# Patient Record
Sex: Female | Born: 1970
Health system: Southern US, Community
[De-identification: ages and names within clinical notes are randomized; demographics above are authoritative.]

## PROBLEM LIST (undated history)

## (undated) DIAGNOSIS — Z8659 Personal history of other mental and behavioral disorders: Secondary | ICD-10-CM

## (undated) DIAGNOSIS — F329 Major depressive disorder, single episode, unspecified: Secondary | ICD-10-CM

## (undated) DIAGNOSIS — E785 Hyperlipidemia, unspecified: Secondary | ICD-10-CM

## (undated) DIAGNOSIS — K209 Esophagitis, unspecified without bleeding: Secondary | ICD-10-CM

## (undated) DIAGNOSIS — M1990 Unspecified osteoarthritis, unspecified site: Secondary | ICD-10-CM

## (undated) DIAGNOSIS — F32A Depression, unspecified: Secondary | ICD-10-CM

## (undated) DIAGNOSIS — K219 Gastro-esophageal reflux disease without esophagitis: Secondary | ICD-10-CM

## (undated) DIAGNOSIS — F410 Panic disorder [episodic paroxysmal anxiety] without agoraphobia: Secondary | ICD-10-CM

## (undated) DIAGNOSIS — Z973 Presence of spectacles and contact lenses: Secondary | ICD-10-CM

## (undated) DIAGNOSIS — R351 Nocturia: Secondary | ICD-10-CM

## (undated) DIAGNOSIS — T83711A Erosion of implanted vaginal mesh and other prosthetic materials to surrounding organ or tissue, initial encounter: Secondary | ICD-10-CM

## (undated) DIAGNOSIS — F319 Bipolar disorder, unspecified: Secondary | ICD-10-CM

## (undated) DIAGNOSIS — F1020 Alcohol dependence, uncomplicated: Secondary | ICD-10-CM

## (undated) DIAGNOSIS — K2970 Gastritis, unspecified, without bleeding: Secondary | ICD-10-CM

## (undated) DIAGNOSIS — F419 Anxiety disorder, unspecified: Secondary | ICD-10-CM

## (undated) DIAGNOSIS — R350 Frequency of micturition: Secondary | ICD-10-CM

## (undated) DIAGNOSIS — N393 Stress incontinence (female) (male): Secondary | ICD-10-CM

## (undated) DIAGNOSIS — B9681 Helicobacter pylori [H. pylori] as the cause of diseases classified elsewhere: Secondary | ICD-10-CM

## (undated) DIAGNOSIS — R748 Abnormal levels of other serum enzymes: Secondary | ICD-10-CM

## (undated) HISTORY — PX: ABDOMINAL HYSTERECTOMY: SHX81

## (undated) HISTORY — PX: OTHER SURGICAL HISTORY: SHX169

## (undated) HISTORY — PX: CARPAL TUNNEL RELEASE: SHX101

## (undated) HISTORY — DX: Alcohol dependence, uncomplicated: F10.20

## (undated) HISTORY — DX: Gastro-esophageal reflux disease without esophagitis: K21.9

## (undated) HISTORY — PX: TUBAL LIGATION: SHX77

---

## 1990-10-13 HISTORY — PX: CHOLECYSTECTOMY: SHX55

## 1998-05-07 ENCOUNTER — Encounter: Admission: RE | Admit: 1998-05-07 | Discharge: 1998-05-07 | Payer: Self-pay | Admitting: Family Medicine

## 1998-06-22 ENCOUNTER — Encounter: Admission: RE | Admit: 1998-06-22 | Discharge: 1998-06-22 | Payer: Self-pay | Admitting: Family Medicine

## 1998-09-20 ENCOUNTER — Encounter: Admission: RE | Admit: 1998-09-20 | Discharge: 1998-09-20 | Payer: Self-pay | Admitting: Family Medicine

## 1999-12-09 ENCOUNTER — Encounter: Admission: RE | Admit: 1999-12-09 | Discharge: 1999-12-09 | Payer: Self-pay | Admitting: Family Medicine

## 2000-01-17 ENCOUNTER — Encounter: Admission: RE | Admit: 2000-01-17 | Discharge: 2000-01-17 | Payer: Self-pay | Admitting: Family Medicine

## 2000-02-03 ENCOUNTER — Encounter: Admission: RE | Admit: 2000-02-03 | Discharge: 2000-02-03 | Payer: Self-pay | Admitting: Family Medicine

## 2000-02-03 ENCOUNTER — Other Ambulatory Visit: Admission: RE | Admit: 2000-02-03 | Discharge: 2000-02-03 | Payer: Self-pay | Admitting: Family Medicine

## 2001-05-11 ENCOUNTER — Encounter: Admission: RE | Admit: 2001-05-11 | Discharge: 2001-05-11 | Payer: Self-pay | Admitting: Family Medicine

## 2002-11-01 ENCOUNTER — Encounter: Admission: RE | Admit: 2002-11-01 | Discharge: 2002-11-01 | Payer: Self-pay | Admitting: Family Medicine

## 2003-08-25 ENCOUNTER — Encounter: Admission: RE | Admit: 2003-08-25 | Discharge: 2003-08-25 | Payer: Self-pay | Admitting: Family Medicine

## 2004-01-16 ENCOUNTER — Encounter: Admission: RE | Admit: 2004-01-16 | Discharge: 2004-01-16 | Payer: Self-pay | Admitting: Sports Medicine

## 2004-05-15 ENCOUNTER — Emergency Department (HOSPITAL_COMMUNITY): Admission: EM | Admit: 2004-05-15 | Discharge: 2004-05-15 | Payer: Self-pay | Admitting: Family Medicine

## 2004-05-24 ENCOUNTER — Encounter: Admission: RE | Admit: 2004-05-24 | Discharge: 2004-05-24 | Payer: Self-pay | Admitting: Family Medicine

## 2004-06-28 ENCOUNTER — Emergency Department (HOSPITAL_COMMUNITY): Admission: EM | Admit: 2004-06-28 | Discharge: 2004-06-28 | Payer: Self-pay | Admitting: Emergency Medicine

## 2004-07-11 ENCOUNTER — Ambulatory Visit: Payer: Self-pay | Admitting: Sports Medicine

## 2004-11-28 ENCOUNTER — Ambulatory Visit: Payer: Self-pay | Admitting: Sports Medicine

## 2004-12-17 ENCOUNTER — Ambulatory Visit: Payer: Self-pay | Admitting: Family Medicine

## 2004-12-25 ENCOUNTER — Ambulatory Visit: Payer: Self-pay | Admitting: Family Medicine

## 2004-12-25 ENCOUNTER — Other Ambulatory Visit: Admission: RE | Admit: 2004-12-25 | Discharge: 2004-12-25 | Payer: Self-pay | Admitting: Family Medicine

## 2005-01-17 ENCOUNTER — Ambulatory Visit: Payer: Self-pay | Admitting: Family Medicine

## 2005-02-05 ENCOUNTER — Ambulatory Visit: Payer: Self-pay | Admitting: Family Medicine

## 2005-06-30 ENCOUNTER — Ambulatory Visit: Payer: Self-pay | Admitting: Sports Medicine

## 2005-09-11 ENCOUNTER — Ambulatory Visit: Payer: Self-pay | Admitting: Sports Medicine

## 2005-09-11 ENCOUNTER — Encounter: Admission: RE | Admit: 2005-09-11 | Discharge: 2005-09-11 | Payer: Self-pay | Admitting: Family Medicine

## 2005-09-28 ENCOUNTER — Emergency Department (HOSPITAL_COMMUNITY): Admission: EM | Admit: 2005-09-28 | Discharge: 2005-09-28 | Payer: Self-pay | Admitting: Family Medicine

## 2005-10-23 HISTORY — PX: OTHER SURGICAL HISTORY: SHX169

## 2005-11-13 ENCOUNTER — Ambulatory Visit: Payer: Self-pay | Admitting: Family Medicine

## 2005-11-13 ENCOUNTER — Other Ambulatory Visit: Admission: RE | Admit: 2005-11-13 | Discharge: 2005-11-13 | Payer: Self-pay | Admitting: Family Medicine

## 2005-11-13 ENCOUNTER — Encounter (INDEPENDENT_AMBULATORY_CARE_PROVIDER_SITE_OTHER): Payer: Self-pay | Admitting: *Deleted

## 2005-11-13 LAB — CONVERTED CEMR LAB

## 2006-04-22 ENCOUNTER — Encounter (INDEPENDENT_AMBULATORY_CARE_PROVIDER_SITE_OTHER): Payer: Self-pay | Admitting: Specialist

## 2006-04-22 ENCOUNTER — Ambulatory Visit (HOSPITAL_COMMUNITY): Admission: RE | Admit: 2006-04-22 | Discharge: 2006-04-22 | Payer: Self-pay | Admitting: *Deleted

## 2006-04-22 HISTORY — PX: OTHER SURGICAL HISTORY: SHX169

## 2006-06-12 ENCOUNTER — Emergency Department (HOSPITAL_COMMUNITY): Admission: EM | Admit: 2006-06-12 | Discharge: 2006-06-12 | Payer: Self-pay | Admitting: Family Medicine

## 2006-08-17 ENCOUNTER — Ambulatory Visit: Payer: Self-pay | Admitting: Sports Medicine

## 2006-10-22 ENCOUNTER — Encounter (INDEPENDENT_AMBULATORY_CARE_PROVIDER_SITE_OTHER): Payer: Self-pay | Admitting: Specialist

## 2006-10-22 ENCOUNTER — Ambulatory Visit (HOSPITAL_COMMUNITY): Admission: RE | Admit: 2006-10-22 | Discharge: 2006-10-22 | Payer: Self-pay | Admitting: *Deleted

## 2006-10-22 HISTORY — PX: LAPAROSCOPIC ASSISTED VAGINAL HYSTERECTOMY: SHX5398

## 2006-11-18 ENCOUNTER — Emergency Department (HOSPITAL_COMMUNITY): Admission: EM | Admit: 2006-11-18 | Discharge: 2006-11-18 | Payer: Self-pay | Admitting: Family Medicine

## 2006-11-20 ENCOUNTER — Ambulatory Visit: Payer: Self-pay | Admitting: Family Medicine

## 2006-11-25 ENCOUNTER — Ambulatory Visit: Payer: Self-pay | Admitting: Family Medicine

## 2006-11-25 ENCOUNTER — Encounter (INDEPENDENT_AMBULATORY_CARE_PROVIDER_SITE_OTHER): Payer: Self-pay | Admitting: Family Medicine

## 2006-11-25 LAB — CONVERTED CEMR LAB
Cholesterol: 194 mg/dL (ref 0–200)
Free T4: 1.02 ng/dL (ref 0.89–1.80)
HDL: 58 mg/dL (ref >39)
LDL Cholesterol: 120 mg/dL — ABNORMAL HIGH (ref 0–99)
TSH: 1.193 microintl units/mL (ref 0.350–5.50)
Total CHOL/HDL Ratio: 3.3
Triglycerides: 80 mg/dL (ref <150)
VLDL: 16 mg/dL (ref 0–40)

## 2006-12-10 DIAGNOSIS — F339 Major depressive disorder, recurrent, unspecified: Secondary | ICD-10-CM | POA: Insufficient documentation

## 2006-12-10 DIAGNOSIS — F4310 Post-traumatic stress disorder, unspecified: Secondary | ICD-10-CM

## 2006-12-10 DIAGNOSIS — K219 Gastro-esophageal reflux disease without esophagitis: Secondary | ICD-10-CM

## 2006-12-10 DIAGNOSIS — F410 Panic disorder [episodic paroxysmal anxiety] without agoraphobia: Secondary | ICD-10-CM

## 2006-12-11 ENCOUNTER — Encounter (INDEPENDENT_AMBULATORY_CARE_PROVIDER_SITE_OTHER): Payer: Self-pay | Admitting: *Deleted

## 2007-01-17 ENCOUNTER — Emergency Department (HOSPITAL_COMMUNITY): Admission: EM | Admit: 2007-01-17 | Discharge: 2007-01-17 | Payer: Self-pay | Admitting: Emergency Medicine

## 2007-01-27 ENCOUNTER — Telehealth (INDEPENDENT_AMBULATORY_CARE_PROVIDER_SITE_OTHER): Payer: Self-pay | Admitting: Family Medicine

## 2007-03-26 ENCOUNTER — Encounter (INDEPENDENT_AMBULATORY_CARE_PROVIDER_SITE_OTHER): Payer: Self-pay | Admitting: Surgery

## 2007-03-26 ENCOUNTER — Ambulatory Visit (HOSPITAL_BASED_OUTPATIENT_CLINIC_OR_DEPARTMENT_OTHER): Admission: RE | Admit: 2007-03-26 | Discharge: 2007-03-26 | Payer: Self-pay | Admitting: Surgery

## 2007-03-26 HISTORY — PX: OTHER SURGICAL HISTORY: SHX169

## 2007-10-25 ENCOUNTER — Emergency Department (HOSPITAL_COMMUNITY): Admission: EM | Admit: 2007-10-25 | Discharge: 2007-10-25 | Payer: Self-pay | Admitting: Family Medicine

## 2007-12-03 ENCOUNTER — Ambulatory Visit: Payer: Self-pay | Admitting: Family Medicine

## 2007-12-03 LAB — CONVERTED CEMR LAB: Rapid Strep: NEGATIVE

## 2008-02-18 ENCOUNTER — Telehealth (INDEPENDENT_AMBULATORY_CARE_PROVIDER_SITE_OTHER): Payer: Self-pay | Admitting: *Deleted

## 2008-04-03 ENCOUNTER — Telehealth: Payer: Self-pay | Admitting: Family Medicine

## 2008-05-04 ENCOUNTER — Ambulatory Visit: Payer: Self-pay | Admitting: Sports Medicine

## 2008-07-31 ENCOUNTER — Telehealth (INDEPENDENT_AMBULATORY_CARE_PROVIDER_SITE_OTHER): Payer: Self-pay | Admitting: *Deleted

## 2008-08-03 ENCOUNTER — Telehealth: Payer: Self-pay | Admitting: Family Medicine

## 2008-08-03 ENCOUNTER — Ambulatory Visit: Payer: Self-pay | Admitting: Family Medicine

## 2008-08-07 ENCOUNTER — Telehealth: Payer: Self-pay | Admitting: *Deleted

## 2008-08-08 ENCOUNTER — Telehealth: Payer: Self-pay | Admitting: Family Medicine

## 2008-09-04 ENCOUNTER — Ambulatory Visit: Payer: Self-pay | Admitting: Family Medicine

## 2009-03-21 ENCOUNTER — Ambulatory Visit: Payer: Self-pay | Admitting: Family Medicine

## 2009-03-21 DIAGNOSIS — N3946 Mixed incontinence: Secondary | ICD-10-CM

## 2009-03-21 LAB — CONVERTED CEMR LAB
Bilirubin Urine: NEGATIVE
Glucose, Urine, Semiquant: NEGATIVE
Ketones, urine, test strip: NEGATIVE
Nitrite: NEGATIVE
Protein, U semiquant: NEGATIVE
Specific Gravity, Urine: 1.02
Urobilinogen, UA: 0.2
WBC Urine, dipstick: NEGATIVE
pH: 6

## 2009-04-04 ENCOUNTER — Telehealth: Payer: Self-pay | Admitting: Family Medicine

## 2009-04-19 ENCOUNTER — Encounter: Payer: Self-pay | Admitting: Family Medicine

## 2009-04-19 ENCOUNTER — Ambulatory Visit: Payer: Self-pay | Admitting: Family Medicine

## 2009-04-19 DIAGNOSIS — E785 Hyperlipidemia, unspecified: Secondary | ICD-10-CM | POA: Insufficient documentation

## 2009-04-19 LAB — CONVERTED CEMR LAB
ALT: 48 units/L — ABNORMAL HIGH (ref 0–35)
AST: 37 units/L (ref 0–37)
Albumin: 4.4 g/dL (ref 3.5–5.2)
Alkaline Phosphatase: 60 units/L (ref 39–117)
BUN: 13 mg/dL (ref 6–23)
CO2: 21 meq/L (ref 19–32)
Calcium: 9.4 mg/dL (ref 8.4–10.5)
Chloride: 106 meq/L (ref 96–112)
Cholesterol: 230 mg/dL — ABNORMAL HIGH (ref 0–200)
Creatinine, Ser: 0.83 mg/dL (ref 0.40–1.20)
Glucose, Bld: 102 mg/dL — ABNORMAL HIGH (ref 70–99)
HDL: 60 mg/dL (ref >39)
LDL Cholesterol: 149 mg/dL — ABNORMAL HIGH (ref 0–99)
Potassium: 4.5 meq/L (ref 3.5–5.3)
Sodium: 139 meq/L (ref 135–145)
TSH: 1.451 microintl units/mL (ref 0.350–4.500)
Total Bilirubin: 0.4 mg/dL (ref 0.3–1.2)
Total CHOL/HDL Ratio: 3.8
Total Protein: 6.9 g/dL (ref 6.0–8.3)
Triglycerides: 107 mg/dL (ref <150)
VLDL: 21 mg/dL (ref 0–40)

## 2009-04-20 ENCOUNTER — Telehealth: Payer: Self-pay | Admitting: *Deleted

## 2009-04-26 ENCOUNTER — Encounter: Payer: Self-pay | Admitting: Family Medicine

## 2009-05-03 ENCOUNTER — Telehealth (INDEPENDENT_AMBULATORY_CARE_PROVIDER_SITE_OTHER): Payer: Self-pay | Admitting: *Deleted

## 2009-06-01 ENCOUNTER — Encounter: Payer: Self-pay | Admitting: Family Medicine

## 2009-06-13 ENCOUNTER — Telehealth: Payer: Self-pay | Admitting: Family Medicine

## 2009-06-14 ENCOUNTER — Ambulatory Visit: Payer: Self-pay | Admitting: Family Medicine

## 2009-07-10 ENCOUNTER — Encounter: Payer: Self-pay | Admitting: Family Medicine

## 2009-08-09 ENCOUNTER — Ambulatory Visit (HOSPITAL_BASED_OUTPATIENT_CLINIC_OR_DEPARTMENT_OTHER): Admission: RE | Admit: 2009-08-09 | Discharge: 2009-08-10 | Payer: Self-pay | Admitting: Urology

## 2009-08-16 ENCOUNTER — Encounter: Payer: Self-pay | Admitting: Family Medicine

## 2009-09-11 ENCOUNTER — Encounter: Payer: Self-pay | Admitting: Family Medicine

## 2009-10-01 ENCOUNTER — Telehealth: Payer: Self-pay | Admitting: Family Medicine

## 2009-10-03 ENCOUNTER — Ambulatory Visit: Payer: Self-pay | Admitting: Family Medicine

## 2009-10-03 LAB — CONVERTED CEMR LAB: Rapid Strep: NEGATIVE

## 2009-12-20 ENCOUNTER — Telehealth: Payer: Self-pay | Admitting: Family Medicine

## 2010-01-11 ENCOUNTER — Encounter: Payer: Self-pay | Admitting: *Deleted

## 2010-07-19 ENCOUNTER — Encounter: Payer: Self-pay | Admitting: Family Medicine

## 2010-08-15 ENCOUNTER — Encounter: Payer: Self-pay | Admitting: Family Medicine

## 2010-10-09 ENCOUNTER — Encounter: Payer: Self-pay | Admitting: Gastroenterology

## 2010-11-12 NOTE — Progress Notes (Signed)
Summary: phn msg   Phone Note Call from Patient   Caller: Patient Summary of Call: had to resch today b/c her cat is very sick Initial call taken by: De Nurse,  December 20, 2009 9:52 AM

## 2010-11-12 NOTE — Letter (Signed)
Summary: Generic Letter  [facility]     [place], [place]    Phone:   Fax:     07/19/2010  Margaret Barker [address] [place], [place]  [address]  Dear [name],   I am writing to inform you that you need to make an appointment to come in for your yearly well-woman visit, which includes a PAP smear. It looks as though it has been a while since you have come in to have routine screening done. Please call the office and make an appointment with me, Dr. Fara Boros, your new primary doctor.  I look forward to meeting you!        Sincerely,   Demetria Pore MD  Appended Document: Generic Letter mailed.

## 2010-11-12 NOTE — Letter (Signed)
Summary: Probation Letter  [facility]  [address]   [place], [place] [address]   Phone: [phone]  Fax: [phone]    01/11/2010  Margaret Barker [address] [place], [place]  [address]  Dear [name],  With the goal of better serving all our patients the [facility] is following each patient's missed appointments.  You have missed at least 3 appointments with our practice.If you cannot keep your appointment, we expect you to call at least 24 hours before your appointment time.  Missing appointments prevents other patients from seeing [place] and makes it difficult to provide you with the best possible medical care.      1.   If you miss one more appointment, we will only give you limited medical services. This means we will not call in medication refills, complete a form, or make a referral for you except when you are here for a scheduled office visit.    2.   If you miss 2 or more appointments in the next year, we will dismiss you from our practice.    Our office staff can be reached at [phone] Monday through Friday from 8:30 a.m.-5:00 p.m. and will be glad to schedule your appointment as necessary.    Thank you.   [facility]

## 2010-11-12 NOTE — Miscellaneous (Signed)
   Clinical Lists Changes  Problems: Removed problem of THROAT PAIN (ICD-784.1) Removed problem of COUGH, CHRONIC (ICD-786.2) Removed problem of WEIGHT GAIN (ICD-783.1) Removed problem of HEMATURIA, MICROSCOPIC, HX OF (ICD-V13.09) Removed problem of ALLERGIC RHINITIS CAUSE UNSPECIFIED (ICD-477.9) Removed problem of BREAST MASS, LEFT (ICD-611.72) Removed problem of HYPERHIDROSIS (ICD-780.8) Medications: Removed medication of AMBIEN 5 MG TABS (ZOLPIDEM TARTRATE) 1 tab by mouth daily. Removed medication of CHLORASEPTIC 1.4 % LIQD (PHENOL) 2 sprays into throat every 4-6 hrs as needed throat pain.

## 2010-11-14 DIAGNOSIS — R1013 Epigastric pain: Secondary | ICD-10-CM

## 2010-11-14 DIAGNOSIS — R197 Diarrhea, unspecified: Secondary | ICD-10-CM

## 2010-11-14 NOTE — Letter (Signed)
Summary: New Patient letter  [facility]  [address] [place], [place] [address]   Phone: [phone]  Fax: [phone]       10/09/2010 MRN: [id number]  Margaret Barker [address] [place], [place]  [address]  Dear Margaret Barker,  Welcome to the Gastroenterology Division at [facility]    You are scheduled to see Dr.  Arlyce Dice on 11-18-10 at 2:15pm on the 3rd floor at [facility], [address]  We ask that you try to arrive at our office 15 minutes prior to your appointment time to allow for check-in.  We would like you to complete the enclosed self-administered evaluation form prior to your visit and bring it with you on the day of your appointment.  We will review it with you.  Also, please bring a complete list of all your medications or, if you prefer, bring the medication bottles and we will list them.  Please bring your insurance card so that we may make a copy of it.  If your insurance requires a referral to see a specialist, please bring your referral form from your primary care physician.  Co-payments are due at the time of your visit and may be paid by cash, check or credit card.     Your office visit will consist of a consult with your physician (includes a physical exam), any laboratory testing he/she may order, scheduling of any necessary diagnostic testing (e.g. x-ray, ultrasound, CT-scan), and scheduling of a procedure (e.g. Endoscopy, Colonoscopy) if required.  Please allow enough time on your schedule to allow for any/all of these possibilities.    If you cannot keep your appointment, please call [phone] to cancel or reschedule prior to your appointment date.  This allows [place] the opportunity to schedule an appointment for another patient in need of care.  If you do not cancel or reschedule by 5 p.m. the business day prior to your appointment date, you will be charged a $50.00 late cancellation/no-show fee.    Thank you for choosing [facility] for your medical needs.  We appreciate the opportunity to care for you.  Please visit [place] at our website  to learn more about our practice.                     Sincerely,                                                             The Gastroenterology Division

## 2010-11-18 ENCOUNTER — Encounter: Payer: Self-pay | Admitting: Gastroenterology

## 2010-11-18 ENCOUNTER — Other Ambulatory Visit: Payer: Self-pay | Admitting: Gastroenterology

## 2010-11-18 ENCOUNTER — Encounter: Payer: Self-pay | Admitting: Internal Medicine

## 2010-11-18 ENCOUNTER — Ambulatory Visit (INDEPENDENT_AMBULATORY_CARE_PROVIDER_SITE_OTHER): Payer: Medicare Other | Admitting: Gastroenterology

## 2010-11-18 ENCOUNTER — Other Ambulatory Visit: Payer: Medicare Other

## 2010-11-18 DIAGNOSIS — R197 Diarrhea, unspecified: Secondary | ICD-10-CM | POA: Insufficient documentation

## 2010-11-18 DIAGNOSIS — R1084 Generalized abdominal pain: Secondary | ICD-10-CM | POA: Insufficient documentation

## 2010-11-18 DIAGNOSIS — R1013 Epigastric pain: Secondary | ICD-10-CM

## 2010-11-18 LAB — CBC WITH DIFFERENTIAL/PLATELET
Basophils Absolute: 0 10*3/uL (ref 0.0–0.1)
Eosinophils Absolute: 0.1 10*3/uL (ref 0.0–0.7)
Hemoglobin: 14.2 g/dL (ref 12.0–15.0)
Lymphocytes Relative: 28.7 % (ref 12.0–46.0)
Lymphs Abs: 2.8 10*3/uL (ref 0.7–4.0)
MCHC: 34.3 g/dL (ref 30.0–36.0)
MCV: 91.2 fl (ref 78.0–100.0)
Monocytes Absolute: 0.5 10*3/uL (ref 0.1–1.0)
Monocytes Relative: 4.8 % (ref 3.0–12.0)
Neutro Abs: 6.3 10*3/uL (ref 1.4–7.7)
Neutrophils Relative %: 65.4 % (ref 43.0–77.0)
RBC: 4.55 Mil/uL (ref 3.87–5.11)
RDW: 13.4 % (ref 11.5–14.6)
WBC: 9.6 10*3/uL (ref 4.5–10.5)

## 2010-11-18 LAB — HEPATIC FUNCTION PANEL
ALT: 15 U/L (ref 0–35)
AST: 15 U/L (ref 0–37)
Albumin: 4.5 g/dL (ref 3.5–5.2)
Alkaline Phosphatase: 40 U/L (ref 39–117)
Bilirubin, Direct: 0.1 mg/dL (ref 0.0–0.3)
Total Bilirubin: 0.5 mg/dL (ref 0.3–1.2)

## 2010-11-21 ENCOUNTER — Encounter (INDEPENDENT_AMBULATORY_CARE_PROVIDER_SITE_OTHER): Payer: Self-pay | Admitting: *Deleted

## 2010-11-21 ENCOUNTER — Other Ambulatory Visit: Payer: Medicare Other

## 2010-11-21 ENCOUNTER — Other Ambulatory Visit: Payer: Medicare Other | Admitting: Gastroenterology

## 2010-11-21 ENCOUNTER — Other Ambulatory Visit: Payer: Self-pay | Admitting: Gastroenterology

## 2010-11-21 DIAGNOSIS — R109 Unspecified abdominal pain: Secondary | ICD-10-CM

## 2010-11-21 LAB — FECAL OCCULT BLOOD, IMMUNOCHEMICAL: Fecal Occult Bld: NEGATIVE

## 2010-11-22 ENCOUNTER — Other Ambulatory Visit: Payer: Self-pay | Admitting: Gastroenterology

## 2010-11-22 ENCOUNTER — Encounter: Payer: Self-pay | Admitting: Gastroenterology

## 2010-11-26 ENCOUNTER — Other Ambulatory Visit: Payer: Self-pay | Admitting: Gastroenterology

## 2010-11-26 ENCOUNTER — Encounter (AMBULATORY_SURGERY_CENTER): Payer: Medicare Other | Admitting: Gastroenterology

## 2010-11-26 ENCOUNTER — Encounter: Payer: Self-pay | Admitting: Gastroenterology

## 2010-11-26 DIAGNOSIS — R109 Unspecified abdominal pain: Secondary | ICD-10-CM

## 2010-11-26 DIAGNOSIS — R197 Diarrhea, unspecified: Secondary | ICD-10-CM

## 2010-11-27 ENCOUNTER — Telehealth: Payer: Self-pay | Admitting: Gastroenterology

## 2010-11-27 ENCOUNTER — Other Ambulatory Visit (HOSPITAL_COMMUNITY): Payer: Medicare Other

## 2010-11-28 NOTE — Assessment & Plan Note (Signed)
Summary: CHRONIC DIARRHEA/YF   NO GI HX PER DEBBIE [phone]//MCR & MED...    History of Present Illness Primary GI MD: Melvia Heaps MD [facility] Primary Provider: Helene Kelp, PA Requesting Provider: Helene Kelp, MD Chief Complaint: Intermittant sharp abd pains with urgent diarrhea since September. Pt denies any blood in stool. Pt does have Nausea and loss of appetitie with episodes. History of Present Illness:   Margaret Barker is a 40 year old white female referred at the request of Dr. Caryn Bee for evaluation of diarrhea and abdominal pain.  For the past 6 months she has been company of diarrhea consisting of 1-2 loose stools a day, up to 3-4 times a week.  Diarrhea is accompaned by crampy lower bowel pain and urgency.  On at least one occasion she claims that stools were black.  She does not awaken to move her bowels.  She also complains of frequent upper abdominal pain.  Pain may last until she drinks viscous lidocaine.  She takes omeprazole daily.  It is without radiation and unrelated to eating.  There has been no change in her medications nor has she taken antibiotics.  She is on no gastric irritants including nonsteroidals.   GI Review of Systems    Reports abdominal pain, acid reflux, loss of appetite, nausea, and  weight loss.     Location of  Abdominal pain: upper abdomen. Weight loss of 30 pounds over 5 months.   Denies belching, bloating, chest pain, dysphagia with liquids, dysphagia with solids, heartburn, vomiting, vomiting blood, and  weight gain.      Reports diarrhea.     Denies anal fissure, black tarry stools, change in bowel habit, constipation, diverticulosis, fecal incontinence, heme positive stool, hemorrhoids, irritable bowel syndrome, jaundice, light color stool, liver problems, rectal bleeding, and  rectal pain.    Current Medications (verified): 1)  Bupropion Hcl 300 Mg Xr24h-Tab (Bupropion Hcl) .... One Tablet By Mouth Once Daily 2)  Lexapro 20 Mg Tabs (Escitalopram Oxalate)  .Marland Kitchen.. 1 Tab By Mouth Daily 3)  Alprazolam 1 Mg Tabs (Alprazolam) .... One Tablet By Mouth Four Times A Day 4)  Omeprazole 40 Mg Cpdr (Omeprazole) .... One Tablet By Mouth Once Daily 5)  Clonidine Hcl 0.1 Mg Tabs (Clonidine Hcl) .... One Tablet By Mouth At Bedtime 6)  Lexapro 20 Mg Tabs (Escitalopram Oxalate) .... One Tablet By Mouth Once Daily 7)  Lidocaine Hcl 2 % Soln (Lidocaine Hcl) .Marland Kitchen.. 1 Tsp 4 X Daily As Needed For Abdominal Pain  Allergies (verified): No Known Drug Allergies  Past History:  Past Medical History: disabled secondary to psych issues since 2001, dysmenorrhea-->hysterctomy 01/08, hyplori positive treated 2/06, personality disorder, R breast cyst -->sx on 06/07, Takes boric acid vaginal supp. qd for recurrent BV Alcoholism Anxiety Disorder GERD  Past Surgical History: Reviewed history from 12/10/2006 and no changes required. BTL - 01/18/2000, Cholecystectomy - 01/18/2000, Hysterectomy - Partial - 10/13/2006, Lipid Panel 11/25/06 TC=194, TG=80, HDL=58, LDL=120 - 11/26/2006, R breast cyst removed - 03/13/2006  Family History: HTN,DM, Cervical Cancer Family History of Diabetes: Mother  Social History: Reviewed history from 12/10/2006 and no changes required. Gravida 5 Para 3-1-1-3; Divorced with 3 children.  Children live with their grandmother. Remarried x5 yr , lives with her husband. Smokes 1/2 ppd.  Quit ETOH in 2000.; Disabled secondary to psyc. Issues since 2001  Review of Systems       The patient complains of allergy/sinus, anxiety-new, back pain, headaches-new, muscle pains/cramps, sore throat, thirst - excessive, and urination -  excessive.  The patient denies anemia, arthritis/joint pain, blood in urine, breast changes/lumps, change in vision, confusion, cough, coughing up blood, depression-new, fainting, fatigue, fever, hearing problems, heart murmur, heart rhythm changes, itching, menstrual pain, night sweats, nosebleeds, pregnancy symptoms, shortness of breath, skin  rash, sleeping problems, swelling of feet/legs, swollen lymph glands, thirst - excessive , urination - excessive , urination changes/pain, urine leakage, vision changes, and voice change.         All other systems were reviewed and were negative   Vital Signs:  Patient profile:   40 year old female Height:      64.5 inches Weight:      128.25 pounds BMI:     21.75 Pulse rate:   88 / minute Pulse rhythm:   regular BP sitting:   92 / 48  (right arm) Cuff size:   regular  Vitals Entered By: Christie Nottingham CMA Duncan Dull) (November 18, 2010 2:02 PM)  Physical Exam  Additional Exam:  On physical exam she is a well-developed well-nourished female  skin: anicteric HEENT: normocephalic; PEERLA; no nasal or pharyngeal abnormalities neck: supple nodes: no cervical lymphadenopathy chest: clear to ausculatation and percussion heart: no murmurs, gallops, or rubs abd: soft, nontender; BS normoactive; no abdominal masses,  organomegaly; there is minimal tenderness to palpation in the upper midepigastrium rectal: deferred ext: no cynanosis, clubbing, edema skeletal: no deformities neuro: oriented x 3; no focal abnormalities    Impression & Recommendations:  Problem # 1:  DIARRHEA (ICD-787.91) Symptoms are nonspecific.  History of a black stool raises the question of GI bleeding.  With the predominance of upper GI symptoms including pain one must consider diarrhea secondary to a more proximal GI problem.  Infection is unlikely.  Although there has been no change in her medications she is on several medications that can cause diarrhea.  Recommendations #1 complete workup for her upper abdominal pain.  If no abnormalities are seen I would consider colonoscopy #2 Hemoccult #3 consider fiber supplementation and anticholinergics  Problem # 2:  ABDOMINAL PAIN, GENERALIZED (ICD-789.07) Etiology of her upper abdominal  pain is unclear.  This could be due to ulcer or nonulcer dyspepsia.  Chronic  cholecystitis is also another consideration.  Recommendations #1 upper endoscopy #2 abdominal ultrasound if endoscopy is unrevealing  #3 check LFTs Orders: TLB-CBC Platelet - w/Differential (85025-CBCD) TLB-Hepatic/Liver Function Pnl (80076-HEPATIC) EGD (EGD)  Patient Instructions: 1)  Copy sent to : Helene Kelp, PA 2)  Your EGD is scheduled on 11/21/2010 at 9am 3)  You will go to the basement for labs today 4)  Conscious Sedation brochure given.  5)  Upper Endoscopy brochure given.  6)  The medication list was reviewed and reconciled.  All changed / newly prescribed medications were explained.  A complete medication list was provided to the patient / caregiver.

## 2010-11-28 NOTE — Letter (Signed)
Summary: Results Letter  [facility]  [address] [place], [place] [address]   Phone: [phone]  Fax: [phone]        November 18, 2010 MRN: [id number]    Margaret Barker [address] [place], [place]  [address]    Dear [name],  It is my pleasure to have treated you recently as a new patient in my office. I appreciate your confidence and the opportunity to participate in your care.  Since I do have a busy inpatient endoscopy schedule and office schedule, my office hours vary weekly. I am, however, available for emergency calls everyday through my office. If I am not available for an urgent office appointment, another one of our gastroenterologist will be able to assist you.  My well-trained staff are prepared to help you at all times. For emergencies after office hours, a physician from our Gastroenterology section is always available through my 24 hour answering service  Once again I welcome you as a new patient and I look forward to a happy and healthy relationship             Sincerely,  Louis Meckel MD  This letter has been electronically signed by your physician.  Appended Document: Results Letter letter mailed

## 2010-11-28 NOTE — Procedures (Addendum)
Summary: Upper Endoscopy  Patient: Margaret Barker Note: All result statuses are Final unless otherwise noted.  Tests: (1) Upper Endoscopy (EGD)   EGD Upper Endoscopy       DONE     [facility]     [address]     [place], [place]  [address]           ENDOSCOPY PROCEDURE REPORT           PATIENT:  [name], [name]  MR#:  [id number]     BIRTHDATE:  04/07/1971, 39 yrs. old  GENDER:  female           ENDOSCOPIST:  Barbette Hair. Arlyce Dice, MD     Referred by:  Helene Kelp, PA           PROCEDURE DATE:  11/21/2010     PROCEDURE:  EGD, diagnostic 43235     ASA CLASS:  Class II     INDICATIONS:  abdominal pain           MEDICATIONS:   Fentanyl 100 mcg IV, Versed 10 mg IV, Benadryl 50     mg IV, glycopyrrolate (Robinal) 0.2 mg IV, 0.6cc simethancone 0.6     cc PO     TOPICAL ANESTHETIC:  Exactacain Spray           DESCRIPTION OF PROCEDURE:   After the risks benefits and     alternatives of the procedure were thoroughly explained, informed     consent was obtained.  The LB [id number] [id number] endoscope was     introduced through the mouth and advanced to the third portion of     the duodenum, without limitations.  The instrument was slowly     withdrawn as the mucosa was fully examined.     <<PROCEDUREIMAGES>>           The upper, middle, and distal third of the esophagus were     carefully inspected and no abnormalities were noted. The z-line     was well seen at the GEJ. The endoscope was pushed into the fundus     which was normal including a retroflexed view. The antrum,gastric     body, first and second part of the duodenum were unremarkable (see     image1, image2, image3, image4, image5, image6, and image7).     Retroflexed views revealed no abnormalities.    The scope was then     withdrawn from the patient and the procedure completed.     COMPLICATIONS:  None           ENDOSCOPIC IMPRESSION:     1) Normal EGD     RECOMMENDATIONS:     1) My office will arrange for you to  have an abdominal     ultrasound performed.     2) My office will schedule a colonoscopy     3) Sedation with MAC for future procedures           REPEAT EXAM:  No           ______________________________     Barbette Hair. Arlyce Dice, MD           CC:           n.     eSIGNED:   Barbette Hair. Ninnie Fein at 11/21/2010 09:28 AM           [name], [id number]  Note: An exclamation mark (!) indicates a result that was not dispersed into the  flowsheet. Document Creation Date: 11/21/2010 9:29 AM _______________________________________________________________________  (1) Order result status: Final Collection or observation date-time: 11/21/2010 09:24 Requested date-time:  Receipt date-time:  Reported date-time:  Referring Physician:   Ordering Physician: Melvia Heaps [phone]) Specimen Source:  Source: Launa Grill Order Number: [id number] Lab site:   Appended Document: Upper Endoscopy Abdominal ultrasound scheduled at [facility] for 11/27/10@11am . Left message for patient to call me back.  Appended Document: Upper Endoscopy Spoke with patient and she is aware of abdominal ultrasound appointment date and time.

## 2010-11-28 NOTE — Letter (Signed)
Summary: EGD Instructions  [facility]  [address] [place], [place] [address]   Phone: [phone]  Fax: [phone]       Margaret Barker    1971-01-04    MRN: [id number]       Procedure Day /Date:THURSDAY 11/21/2010     Arrival Time: 8AM     Procedure Time:9AM     Location of Procedure:                    X  [facility] (4th Floor)   PREPARATION FOR ENDOSCOPY   On 11/21/2010  THE DAY OF THE PROCEDURE:  1.   No solid foods, milk or milk products are allowed after midnight the night before your procedure.  2.   Do not drink anything colored red or purple.  Avoid juices with pulp.  No orange juice.  3.  You may drink clear liquids until7AM, which is 2 hours before your procedure.                                                                                                CLEAR LIQUIDS INCLUDE: Water Jello Ice Popsicles Tea (sugar ok, no milk/cream) Powdered fruit flavored drinks Coffee (sugar ok, no milk/cream) Gatorade Juice: apple, white grape, white cranberry  Lemonade Clear bullion, consomm, broth Carbonated beverages (any kind) Strained chicken noodle soup Hard Candy   MEDICATION INSTRUCTIONS  Unless otherwise instructed, you should take regular prescription medications with a small sip of water as early as possible the morning of your procedure.           OTHER INSTRUCTIONS  You will need a responsible adult at least 40 years of age to accompany you and drive you home.   This person must remain in the waiting room during your procedure.  Wear loose fitting clothing that is easily removed.  Leave jewelry and other valuables at home.  However, you may wish to bring a book to read or an iPod/MP3 player to listen to music as you wait for your procedure to start.  Remove all body piercing jewelry and leave at home.  Total time from sign-in until discharge is approximately 2-3 hours.  You should go home directly after your  procedure and rest.  You can resume normal activities the day after your procedure.  The day of your procedure you should not:   Drive   Make legal decisions   Operate machinery   Drink alcohol   Return to work  You will receive specific instructions about eating, activities and medications before you leave.    The above instructions have been reviewed and explained to me by   _______________________    I fully understand and can verbalize these instructions _____________________________ Date _________

## 2010-11-28 NOTE — Letter (Signed)
Summary: [facility]: Immunoassay Fecal Occult Blood (iFOB) Order Form  [facility]  [address] [place], [place] [address]   Phone: [phone]  Fax: [phone]      [facility]: Immunoassay Fecal Occult Blood (iFOB) Order Form   November 18, 2010 MRN: [id number]   Margaret Barker [birth year]   Physicican Name:ROBERT KAPLAN,MD Diagnosis Code:_787.91,789.06    Merri Ray CMA (AAMA)

## 2010-11-28 NOTE — Miscellaneous (Signed)
Summary: [facility]  Clinical Lists Changes  Medications: Added new medication of MOVIPREP 100 GM  SOLR (PEG-KCL-NACL-NASULF-NA ASC-C) As per prep instructions. - Signed Rx of MOVIPREP 100 GM  SOLR (PEG-KCL-NACL-NASULF-NA ASC-C) As per prep instructions.;  #1 x 0;  Signed;  Entered by: Ezra Sites RN;  Authorized by: Louis Meckel MD;  Method used: Electronically to [facility] [phone]*, [address], [place], [place], [place]  [address], Ph: [phone] or [phone], Fax: [phone] Observations: Added new observation of NKA: T (11/22/2010 12:17)    Prescriptions: MOVIPREP 100 GM  SOLR (PEG-KCL-NACL-NASULF-NA ASC-C) As per prep instructions.  #1 x 0   Entered by:   Ezra Sites RN   Authorized by:   Louis Meckel MD   Signed by:   Ezra Sites RN on 11/22/2010   Method used:   Electronically to        [facility] [phone]* (retail)       [address] [place], [place]  [address]       Ph: [phone] or [phone]       Fax: [phone]   RxID:   [id number]

## 2010-11-28 NOTE — Letter (Signed)
Summary: [facility] Instructions  [facility]  [address] [place], [place] [address]   Phone: [phone]  Fax: [phone]       Margaret Barker    1971/09/04    MRN: [id number]        Procedure Day /Date:  Tuesday 11/26/2010     Arrival Time: 9:00 am     Procedure Time: 10:00 am     Location of Procedure:                    _x _  [facility] (4th Floor)                        PREPARATION FOR COLONOSCOPY WITH MOVIPREP   Starting 5 days prior to your procedure Thursday 2/9 do not eat nuts, seeds, popcorn, corn, beans, peas,  salads, or any raw vegetables.  Do not take any fiber supplements (e.g. Metamucil, Citrucel, and Benefiber).  THE DAY BEFORE YOUR PROCEDURE         DATE: Monday 2/13  1.  Drink clear liquids the entire day-NO SOLID FOOD  2.  Do not drink anything colored red or purple.  Avoid juices with pulp.  No orange juice.  3.  Drink at least 64 oz. (8 glasses) of fluid/clear liquids during the day to prevent dehydration and help the prep work efficiently.  CLEAR LIQUIDS INCLUDE: Water Jello Ice Popsicles Tea (sugar ok, no milk/cream) Powdered fruit flavored drinks Coffee (sugar ok, no milk/cream) Gatorade Juice: apple, white grape, white cranberry  Lemonade Clear bullion, consomm, broth Carbonated beverages (any kind) Strained chicken noodle soup Hard Candy                             4.  In the morning, mix first dose of MoviPrep solution:    Empty 1 Pouch A and 1 Pouch B into the disposable container    Add lukewarm drinking water to the top line of the container. Mix to dissolve    Refrigerate (mixed solution should be used within 24 hrs)  5.  Begin drinking the prep at 5:00 p.m. The MoviPrep container is divided by 4 marks.   Every 15 minutes drink the solution down to the next mark (approximately 8 oz) until the full liter is complete.   6.  Follow completed prep with 16 oz of clear liquid of your choice (Nothing red  or purple).  Continue to drink clear liquids until bedtime.  7.  Before going to bed, mix second dose of MoviPrep solution:    Empty 1 Pouch A and 1 Pouch B into the disposable container    Add lukewarm drinking water to the top line of the container. Mix to dissolve    Refrigerate  THE DAY OF YOUR PROCEDURE      DATE: Tuesday 2/14  Beginning at 5:00 a.m. (5 hours before procedure):         1. Every 15 minutes, drink the solution down to the next mark (approx 8 oz) until the full liter is complete.  2. Follow completed prep with 16 oz. of clear liquid of your choice.    3. You may drink clear liquids until 8:00 am (2 HOURS BEFORE PROCEDURE).   MEDICATION INSTRUCTIONS  Unless otherwise instructed, you should take regular prescription medications with a small sip of water   as early as possible the morning of your  procedure.         OTHER INSTRUCTIONS  You will need a responsible adult at least 40 years of age to accompany you and drive you home.   This person must remain in the waiting room during your procedure.  Wear loose fitting clothing that is easily removed.  Leave jewelry and other valuables at home.  However, you may wish to bring a book to read or  an iPod/MP3 player to listen to music as you wait for your procedure to start.  Remove all body piercing jewelry and leave at home.  Total time from sign-in until discharge is approximately 2-3 hours.  You should go home directly after your procedure and rest.  You can resume normal activities the  day after your procedure.  The day of your procedure you should not:   Drive   Make legal decisions   Operate machinery   Drink alcohol   Return to work  You will receive specific instructions about eating, activities and medications before you leave.    The above instructions have been reviewed and explained to me by   Ezra Sites RN  November 22, 2010 1:07 PM    I fully understand and can verbalize  these instructions _____________________________ Date _________

## 2010-11-29 ENCOUNTER — Other Ambulatory Visit (HOSPITAL_COMMUNITY): Payer: Medicare Other

## 2010-11-29 ENCOUNTER — Telehealth: Payer: Self-pay | Admitting: Gastroenterology

## 2010-12-02 ENCOUNTER — Other Ambulatory Visit (HOSPITAL_COMMUNITY): Payer: Medicare Other

## 2010-12-03 ENCOUNTER — Encounter: Payer: Self-pay | Admitting: Gastroenterology

## 2010-12-04 ENCOUNTER — Other Ambulatory Visit: Payer: Self-pay | Admitting: Gastroenterology

## 2010-12-04 ENCOUNTER — Ambulatory Visit (HOSPITAL_COMMUNITY)
Admission: RE | Admit: 2010-12-04 | Discharge: 2010-12-04 | Disposition: A | Discharge status: Home / Self Care | Payer: Medicare Other | Source: Ambulatory Visit | Attending: Gastroenterology | Admitting: Gastroenterology

## 2010-12-04 DIAGNOSIS — Z9089 Acquired absence of other organs: Secondary | ICD-10-CM | POA: Insufficient documentation

## 2010-12-04 DIAGNOSIS — R109 Unspecified abdominal pain: Secondary | ICD-10-CM | POA: Insufficient documentation

## 2010-12-04 NOTE — Procedures (Addendum)
Summary: Colonoscopy  Patient: [name] Note: All result statuses are Final unless otherwise noted.  Tests: (1) Colonoscopy (COL)   COL Colonoscopy           DONE     [facility]     [address]     [place], [place]  [address]           COLONOSCOPY PROCEDURE REPORT           PATIENT:  Margaret Barker, Margaret Barker  MR#:  [id number]     BIRTHDATE:  [birth year], 39 yrs. old  GENDER:  female           ENDOSCOPIST:  Barbette Hair. Arlyce Dice, MD     Referred by:  Helene Kelp, PA           PROCEDURE DATE:  11/26/2010     PROCEDURE:  Colonoscopy with biopsy     ASA CLASS:  Class I     INDICATIONS:  1) unexplained diarrhea           MEDICATIONS:   MAC sedation, administered by CRNA propofol 200mg IV           DESCRIPTION OF PROCEDURE:   After the risks benefits and     alternatives of the procedure were thoroughly explained, informed     consent was obtained.  Digital rectal exam was performed and     revealed no abnormalities.   The LB 180AL [id number] endoscope was     introduced through the anus and advanced to the cecum, which was     identified by both the appendix and ileocecal valve, without     limitations.  The quality of the prep was excellent, using     MoviPrep.  The instrument was then slowly withdrawn as the colon     was fully examined.     <<PROCEDUREIMAGES>>           FINDINGS:  A normal appearing cecum, ileocecal valve, and     appendiceal orifice were identified. The ascending, hepatic     flexure, transverse, splenic flexure, descending, sigmoid colon,     and rectum appeared unremarkable (see image1, image3, image5,     image6, image7, image9, and image10). Random biopsies were taken     every 10cm   Retroflexed views in the rectum revealed no     abnormalities.    The time to cecum =  2.50  minutes. The scope     was then withdrawn (time =  6.75  min) from the patient and the     procedure completed.           COMPLICATIONS:  None           ENDOSCOPIC IMPRESSION:  1) Normal colon     RECOMMENDATIONS:     1) Await biopsy results     2) call office next 1-3 days to schedule followup visit in 2-3     weeks           REPEAT EXAM:  No           ______________________________     Barbette Hair. Arlyce Dice, MD           CC:           n.     eSIGNED:   Barbette Hair. Diarra Kos at 11/26/2010 10:10 AM           [name], [id number]  Note: An exclamation mark (!) indicates a result that was  not dispersed into the flowsheet. Document Creation Date: 11/26/2010 10:11 AM _______________________________________________________________________  (1) Order result status: Final Collection or observation date-time: 11/26/2010 10:04 Requested date-time:  Receipt date-time:  Reported date-time:  Referring Physician:   Ordering Physician: Melvia Heaps [phone]) Specimen Source:  Source: Launa Grill Order Number: [id number] Lab site:

## 2010-12-04 NOTE — Letter (Signed)
Summary: Appt Reminder 2   Gastroenterology  [address] [place], [place] [address]   Phone: [phone]  Fax: [phone]        November 26, 2010 MRN: [id number]    ZANI KYLLONEN [address] [place], [place]  [address]    Dear Ms. Grondahl,   You have a return appointment with Dr. Arlyce Dice on 12/13/10 at 3:15pm.  Please remember to bring a complete list of the medicines you are taking, your insurance card and your co-pay.  If you have to cancel or reschedule this appointment, please call before 5:00 pm the evening before to avoid a cancellation fee.  If you have any questions or concerns, please call [phone].    Sincerely,    Selinda Michaels RN  Appended Document: Appt Reminder 2 Letter is mailed to the patient's home address

## 2010-12-04 NOTE — Progress Notes (Signed)
Summary: need orders for abd ultrasound/pt is there now   Phone Note From Other Clinic   Caller: xrays fx # [phone] Call For: Dr Arlyce Dice Summary of Call: At [facility] for abd ultrasound and they have no orders. Initial call taken by: Leanor Kail [facility],  November 27, 2010 8:07 AM     Appended Document: need orders for abd ultrasound/pt is there now order faxed

## 2010-12-04 NOTE — Progress Notes (Signed)
Summary: Triage   Phone Note Call from Patient Call back at Home Phone [phone]   Caller: Patient Call For: Dr. Arlyce Dice Reason for Call: Talk to Nurse Summary of Call: Had a COL on Tueday and hasn't had a BM since then Initial call taken by: Karna Christmas,  November 29, 2010 12:10 PM  Follow-up for Phone Call        Spoke with patient who denies any pain except what she was having before. Instructed patient to try a gentle laxative like Miralax for her constipation. Explained her normal bowel habits will return- the prep just cleaned her out. I asked why patient did not go to her U/S and she stated she was too groggy and had to r/s to 12/04/10. Patient will call for further problems. Follow-up by: Graciella Freer RN,  November 29, 2010 1:38 PM

## 2010-12-10 NOTE — Letter (Signed)
Summary: Results Letter  [facility]  [address] [place], [place] [address]   Phone: [phone]  Fax: [phone]        December 03, 2010 MRN: [id number]    Margaret Barker [address] [place], [place]  [address]    Dear [name],  Your biopsy results did not show any remarkable findings.  Please continue with the recommendations previously discussed.  Should you have any further questions or immediate concers, feel free to contact me.  Sincerely,  Barbette Hair. Arlyce Dice, M.D., [facility]          Sincerely,  Louis Meckel MD  This letter has been electronically signed by your physician.  Appended Document: Results Letter letter mailed

## 2010-12-13 ENCOUNTER — Ambulatory Visit: Payer: Medicare Other | Admitting: Gastroenterology

## 2011-01-16 LAB — POCT HEMOGLOBIN-HEMACUE: Hemoglobin: 15.1 g/dL — ABNORMAL HIGH (ref 12.0–15.0)

## 2011-01-20 ENCOUNTER — Ambulatory Visit: Payer: Medicare Other | Admitting: Gastroenterology

## 2011-02-25 NOTE — Op Note (Signed)
NAMELUDMILA, EBARB                 ACCOUNT NO.:  [id number]   MEDICAL RECORD NO.:  [id number]          PATIENT TYPE:  AMB   LOCATION:  DSC                          FACILITY:  [facility]   PHYSICIAN:  Wilmon Arms. Corliss Skains, M.D. DATE OF BIRTH:  1971/09/03   DATE OF PROCEDURE:  03/26/2007  DATE OF DISCHARGE:                               OPERATIVE REPORT   PREOPERATIVE DIAGNOSIS:  Left breast mass.   POSTOPERATIVE DIAGNOSIS:  Left breast mass.   PROCEDURE PERFORMED:  Left breast excisional biopsy.   SURGEON:  Wilmon Arms. Tsuei, M.D.   ANESTHESIA:  General.   INDICATIONS:  The patient is a 40 year old female who has had a previous  benign breast biopsy on the right, who presents with a left breast mass  just above her nipple.  This has become tender.  Mammogram and  ultrasound showed no significant findings.  However, due to her concern  we are performing an excisional biopsy.   DESCRIPTION OF PROCEDURE:  The patient was brought to the operating room  and placed in the supine position on operating room table.  After an  adequate level of general anesthesia was obtained, the patient's left  breast was prepped with Betadine and draped in a sterile fashion.  A  time-out was taken to assure the proper patient and proper procedure.  The mass was palpable underneath the edge of the areola around 1  o'clock.  A curvilinear incision was made around the edge of the areola  in the upper outer quadrant.  Dissection was carried down into the  subcutaneous tissue with cautery.  The mass was grasped with an Allis  clamp and cautery was used to dissect around it completely.  The  specimen was oriented with a long suture lateral and a short suture  superior.  Hemostasis was obtained with cautery.  Palpation of the  biopsy cavity showed no other masses.  The wound was closed with a deep  layer of 3-0 Vicryl and a subcuticular of 4-0 Monocryl.  Steri-Strips  and clean dressings were applied.  The patient was  extubated and brought  to recovery in stable condition.  All sponge, instrument and needle  counts were correct.      Wilmon Arms. Tsuei, M.D.  Electronically Signed     MKT/MEDQ  D:  03/26/2007  T:  03/26/2007  Job:  [id number]

## 2011-02-28 NOTE — Op Note (Signed)
[name], Margaret Barker                 ACCOUNT NO.:  [id number]   MEDICAL RECORD NO.:  [id number]          PATIENT TYPE:  AMB   LOCATION:  SDC                           [facility]   PHYSICIAN:  Beloit B. Earlene Plater, M.D.  DATE OF BIRTH:  April 26, 1971   DATE OF PROCEDURE:  10/22/2006  DATE OF DISCHARGE:                               OPERATIVE REPORT   PREOPERATIVE DIAGNOSIS:  Abnormal uterine bleeding.   POSTOPERATIVE DIAGNOSIS:  Abnormal uterine bleeding.   PROCEDURE:  Total laparoscopic hysterectomy.   SURGEON:  Chester Holstein. Earlene Plater, M.D.   ASSISTANT:  Genia Del, M.D.   ANESTHESIA:  General.   SPECIMENS:  Uterus and cervix to pathology.   BLOOD LOSS:  150 mL.   COMPLICATIONS:  None.   FINDINGS:  Slightly enlarged uterus, normal-appearing tubes and ovaries.   INDICATIONS:  Patient with a history of heavy menstrual bleeding.  Previous hysteroscopy with polyp removal did not change her bleeding  pattern and has not responded to medical management.  Is status post  tubal ligation and requesting definitive surgical therapy.  The patient  was advised as to the risks of surgery including infection, bleeding,  damage to surrounding organs and potential need to convert to another  type of hysterectomy.   PROCEDURE:  Patient taken to the operating room and general anesthesia  obtained.  She was placed in the Mary Esther stirrups and prepped and draped  in standard fashion, Foley catheter inserted in the bladder.  Exam under  anesthesia showed a slightly enlarged uterus,  no adnexal masses.   A speculum inserted, the cervix grasped with a speculum.  The uterus to  8 cm.  The RUMI with a #8 tip was assembled and inserted and secured in  standard fashion.   A 10 mm incision placed in the umbilicus, carried sharply to the fascia.  The fascia was divided sharply and elevated with the Kocher clamps.  The  posterior sheath and peritoneum were entered sharply, a pursestring  suture of 0  Vicryl placed around the fascial defect.  A Hasson cannula  inserted and secured.  Pneumoperitoneum obtained with CO2 gas, an 11 mm  port placed in left lower quadrant and a 5 mm in the right.   Trendelenburg position obtained, bowel mobilized superiorly, course of  each ureter identified and found to be well away.  The left round  ligament was placed on traction, sealed and divided with the Gyrus  bipolar.  Tube and uterine-ovarian pedicle similarly sealed and divided.  Bladder flap created sharply, the entire procedure repeated on the right  side in the same manner.  Left uterine artery was then skeletonized,  sealed and divided with the Gyrus, repeated on the right same manner.  The RUMI cup was elevated and the colpotomy made around the cup with the  plasma spatula in standard fashion.  Uterus was delivered into the  vagina, which maintained pneumoperitoneum.  The vaginal cuff was then  closed laparoscopically with interrupted stitches of 0 Vicryl with  hemostasis obtained.   Pelvis irrigated and the lines of dissection inspected.  They  were  hemostatic.  The ports were inspected and were hemostatic after removal.  The scope was removed, gas released, Hasson cannula removed.  The  umbilical incision elevated and the defect closed with the previously-  placed pursestring suture.  Skin was closed at the umbilicus with 4-0  Vicryl.  Skin was closed in the inferior ports with Dermabond.   The patient tolerated the procedure well with no complications.  She was  taken to the recovery room awake and in stable condition.  All counts  correct per the operating room staff.      Gerri Spore B. Earlene Plater, M.D.  Electronically Signed     WBD/MEDQ  D:  10/22/2006  T:  10/22/2006  Job:  [id number]

## 2011-02-28 NOTE — Op Note (Signed)
NAMEMARI, Margaret Barker                 ACCOUNT NO.:  [id number]   MEDICAL RECORD NO.:  [id number]          PATIENT TYPE:  AMB   LOCATION:  SDC                           [facility]   PHYSICIAN:  Wilmon Arms. Corliss Skains, M.D. DATE OF BIRTH:  October 30, 1970   DATE OF PROCEDURE:  04/22/2006  DATE OF DISCHARGE:                                 OPERATIVE REPORT   PREOPERATIVE DIAGNOSIS:  Right breast mass.   POSTOPERATIVE DIAGNOSIS:  Right breast mass.   PROCEDURE PERFORMED:  Right excisional breast biopsy.   SURGEON:  Wilmon Arms. Tsuei, M.D.   ANESTHESIA:  General via LMA.   INDICATIONS:  The patient is a 40 year old female who presented with  tenderness and a palpable mass in her right upper outer quadrant of her  breast.  Mammogram and ultrasound showed only a cluster of cysts with no  solid mass in the area of question.  The patient presented for surgical  evaluation because of persistent pain and the persistence of the palpable  mass.  The patient is also undergoing a hysteroscopy by Dr. Marina Gravel.   DESCRIPTION OF PROCEDURE:  The patient was brought to the operating room and  placed in supine position on the operating room table.  After an adequate  level of general anesthesia was obtained, her right breast was prepped with  Betadine and draped in sterile fashion.  The mass was palpable underneath  the right upper outer quadrant of the areola.  A curvilinear incision was  made around the edge of the areola in the right upper outer quadrant.  This  area had previously been infiltrated with 0.25% Marcaine.  Small skin flaps  were raised and the mass was excised in its entirety with Bovie cautery.  The specimen was oriented with a long suture lateral and a short suture  superior.  The wound was then irrigated with saline and hemostasis was  obtained with cautery.  The wound was closed with a deep layer of 3-0 Vicryl  and a subcuticular layer of 4-0 Monocryl.  Steri-Strips and clean  dressings  were applied.  The patient was then turned over to Dr. Earlene Plater for his portion  of the procedure.      Wilmon Arms. Tsuei, M.D.  Electronically Signed     MKT/MEDQ  D:  04/22/2006  T:  04/22/2006  Job:  [id number]   cc:   Gerri Spore B. Earlene Plater, M.D.  Fax: [phone]

## 2011-02-28 NOTE — Op Note (Signed)
NAMESRI, CLEGG                 ACCOUNT NO.:  [id number]   MEDICAL RECORD NO.:  [id number]          PATIENT TYPE:  AMB   LOCATION:  SDC                           [facility]   PHYSICIAN:  Coupland B. Earlene Plater, M.D.  DATE OF BIRTH:  June 02, 1971   DATE OF PROCEDURE:  04/22/2006  DATE OF DISCHARGE:                                 OPERATIVE REPORT   PREOPERATIVE DIAGNOSIS:  Abnormal bleeding and endometrial polyp.   POSTOPERATIVE DIAGNOSIS:  Abnormal bleeding and endometrial polyp.   PROCEDURE:  Hysteroscopy, dilatation and curettage, polyp removal.   SURGEON:  Teller B. Earlene Plater, M.D.   ANESTHESIA:  General and 10 cc of 1% Nesacaine paracervical block.   SPECIMENS:  Endometrial polyp and curettings submitted to pathology.   BLOOD LOSS:  Minimal.   FLUID DEFICIT:  25 cc.   COMPLICATIONS:  None.   INDICATIONS:  Patient with a history of irregular bleeding.  Sonohysterogram  suggested a polyp.  The patient was advised the risks of surgery including  infection, bleeding, perforation, organ damage. The patient is also having a  breast biopsy by Dr. Harlon Flor prior to the hysteroscopy.   PROCEDURE:  The patient was in the operating room under general anesthesia,  having just undergone excisional breast biopsy by Dr. Harlon Flor.  She was  reprepped and draped in standard fashion.  Bladder emptied with in-and-out  cath.  Exam under anesthesia showed anteverted, slightly enlarged uterus.  No adnexal masses.   Speculum inserted.  Paracervical block placed.  Cervix dilated to #21.  The  diagnostic hysteroscope was inserted after being flushed.  Good uterine  distention occurred.  Uterine cavity inspected.  Overall, it appeared  inactive, although at the left fundal region, there was an approximately 1-  cm polyp.  This was removed with Randall stone forceps and the endometrium  gently curetted with minimal additional tissue return.  Scope was  reinserted.  Cavity reinspected.  No additional  abnormalities seen.  The  procedure was terminated.  Instruments were removed and cervix hemostatic.   The patient tolerated the procedure well without complications.  She was  taken to the recovery room in stable condition.      Gerri Spore B. Earlene Plater, M.D.  Electronically Signed     WBD/MEDQ  D:  04/22/2006  T:  04/22/2006  Job:  [id number]

## 2011-03-05 ENCOUNTER — Other Ambulatory Visit: Payer: Self-pay | Admitting: Family Medicine

## 2011-03-05 DIAGNOSIS — N61 Mastitis without abscess: Secondary | ICD-10-CM

## 2011-03-11 ENCOUNTER — Other Ambulatory Visit: Payer: Medicare Other

## 2011-07-31 LAB — POCT HEMOGLOBIN-HEMACUE
Hemoglobin: 15.6 — ABNORMAL HIGH
Operator id: 128471

## 2012-01-05 DIAGNOSIS — M25559 Pain in unspecified hip: Secondary | ICD-10-CM | POA: Diagnosis not present

## 2012-01-22 DIAGNOSIS — M76899 Other specified enthesopathies of unspecified lower limb, excluding foot: Secondary | ICD-10-CM | POA: Diagnosis not present

## 2012-02-24 DIAGNOSIS — F39 Unspecified mood [affective] disorder: Secondary | ICD-10-CM | POA: Diagnosis not present

## 2012-03-17 DIAGNOSIS — R42 Dizziness and giddiness: Secondary | ICD-10-CM | POA: Diagnosis not present

## 2012-03-17 DIAGNOSIS — R5383 Other fatigue: Secondary | ICD-10-CM | POA: Diagnosis not present

## 2012-03-17 DIAGNOSIS — R51 Headache: Secondary | ICD-10-CM | POA: Diagnosis not present

## 2012-05-13 DIAGNOSIS — N390 Urinary tract infection, site not specified: Secondary | ICD-10-CM | POA: Diagnosis not present

## 2012-05-24 DIAGNOSIS — B373 Candidiasis of vulva and vagina: Secondary | ICD-10-CM | POA: Diagnosis not present

## 2012-06-09 DIAGNOSIS — N3946 Mixed incontinence: Secondary | ICD-10-CM | POA: Diagnosis not present

## 2012-06-10 ENCOUNTER — Other Ambulatory Visit: Payer: Self-pay | Admitting: Urology

## 2012-06-16 ENCOUNTER — Other Ambulatory Visit: Payer: Self-pay | Admitting: Urology

## 2012-06-17 MED ORDER — BUPIVACAINE-EPINEPHRINE 0.25% -1:200000 IJ SOLN: 20.0000 mL | Freq: Once | INTRAMUSCULAR | Status: AC

## 2012-07-06 ENCOUNTER — Encounter (HOSPITAL_BASED_OUTPATIENT_CLINIC_OR_DEPARTMENT_OTHER): Payer: Self-pay | Admitting: *Deleted

## 2012-07-06 NOTE — Progress Notes (Signed)
NPO AFTER MN. ARRIVES AT 0730. NEEDS HG. WILL TAKE PRILOSEC AND LIPITOR AM OF SURG W/ SIP OF WATER. WILLL DO HIBICLENS SHOWER HS BEFORE AND AM DOS.

## 2012-07-08 NOTE — Anesthesia Preprocedure Evaluation (Addendum)
Anesthesia Evaluation  Patient identified by MRN, date of birth, ID band Patient awake    Reviewed: Allergy & Precautions, H&P , NPO status , Patient's Chart, lab work & pertinent test results  Airway Mallampati: II TM Distance: >3 FB Neck ROM: Full    Dental No notable dental hx.    Pulmonary Current Smoker,  breath sounds clear to auscultation  Pulmonary exam normal       Cardiovascular negative cardio ROS  Rhythm:Regular Rate:Normal     Neuro/Psych PSYCHIATRIC DISORDERS Anxiety Depression Bipolar Disorder H/O anxiety d/o, bipolar d/o, alcoholism, panic attacksnegative neurological ROS     GI/Hepatic Neg liver ROS, GERD-  Medicated,  Endo/Other  negative endocrine ROS  Renal/GU negative Renal ROS  negative genitourinary   Musculoskeletal negative musculoskeletal ROS (+)   Abdominal   Peds negative pediatric ROS (+)  Hematology negative hematology ROS (+)   Anesthesia Other Findings   Reproductive/Obstetrics negative OB ROS                           Anesthesia Physical Anesthesia Plan  ASA: II  Anesthesia Plan: General   Post-op Pain Management:    Induction: Intravenous  Airway Management Planned: LMA  Additional Equipment:   Intra-op Plan:   Post-operative Plan: Extubation in OR  Informed Consent: I have reviewed the patients History and Physical, chart, labs and discussed the procedure including the risks, benefits and alternatives for the proposed anesthesia with the patient or authorized representative who has indicated his/her understanding and acceptance.   Dental advisory given  Plan Discussed with: CRNA  Anesthesia Plan Comments:         Anesthesia Quick Evaluation

## 2012-07-09 ENCOUNTER — Ambulatory Visit (HOSPITAL_BASED_OUTPATIENT_CLINIC_OR_DEPARTMENT_OTHER): Payer: Medicare Other | Admitting: Anesthesiology

## 2012-07-09 ENCOUNTER — Encounter (HOSPITAL_BASED_OUTPATIENT_CLINIC_OR_DEPARTMENT_OTHER): Payer: Self-pay | Admitting: Anesthesiology

## 2012-07-09 ENCOUNTER — Ambulatory Visit (HOSPITAL_BASED_OUTPATIENT_CLINIC_OR_DEPARTMENT_OTHER)
Admission: RE | Admit: 2012-07-09 | Discharge: 2012-07-09 | Disposition: A | Discharge status: Home / Self Care | Payer: Medicare Other | Source: Ambulatory Visit | Attending: Urology | Admitting: Urology

## 2012-07-09 ENCOUNTER — Encounter (HOSPITAL_BASED_OUTPATIENT_CLINIC_OR_DEPARTMENT_OTHER)
Admission: RE | Disposition: A | Discharge status: Home / Self Care | Payer: Self-pay | Source: Ambulatory Visit | Attending: Urology

## 2012-07-09 ENCOUNTER — Encounter (HOSPITAL_BASED_OUTPATIENT_CLINIC_OR_DEPARTMENT_OTHER): Payer: Self-pay | Admitting: *Deleted

## 2012-07-09 DIAGNOSIS — K219 Gastro-esophageal reflux disease without esophagitis: Secondary | ICD-10-CM | POA: Diagnosis not present

## 2012-07-09 DIAGNOSIS — F17200 Nicotine dependence, unspecified, uncomplicated: Secondary | ICD-10-CM | POA: Diagnosis not present

## 2012-07-09 DIAGNOSIS — N898 Other specified noninflammatory disorders of vagina: Secondary | ICD-10-CM | POA: Diagnosis not present

## 2012-07-09 DIAGNOSIS — IMO0002 Reserved for concepts with insufficient information to code with codable children: Secondary | ICD-10-CM | POA: Insufficient documentation

## 2012-07-09 DIAGNOSIS — Z79899 Other long term (current) drug therapy: Secondary | ICD-10-CM | POA: Diagnosis not present

## 2012-07-09 DIAGNOSIS — D281 Benign neoplasm of vagina: Secondary | ICD-10-CM | POA: Diagnosis not present

## 2012-07-09 DIAGNOSIS — N841 Polyp of cervix uteri: Secondary | ICD-10-CM | POA: Diagnosis not present

## 2012-07-09 HISTORY — DX: Bipolar disorder, unspecified: F31.9

## 2012-07-09 HISTORY — PX: LESION REMOVAL: SHX5196

## 2012-07-09 HISTORY — DX: Major depressive disorder, single episode, unspecified: F32.9

## 2012-07-09 HISTORY — DX: Nocturia: R35.1

## 2012-07-09 HISTORY — DX: Frequency of micturition: R35.0

## 2012-07-09 HISTORY — DX: Panic disorder (episodic paroxysmal anxiety): F41.0

## 2012-07-09 HISTORY — DX: Stress incontinence (female) (male): N39.3

## 2012-07-09 HISTORY — DX: Depression, unspecified: F32.A

## 2012-07-09 HISTORY — DX: Anxiety disorder, unspecified: F41.9

## 2012-07-09 SURGERY — EXCISION, LESION, VAGINA
Anesthesia: General | Site: Vagina | Wound class: Clean Contaminated

## 2012-07-09 MED ORDER — SODIUM CHLORIDE 0.9 % IV SOLN: 250.0000 mL | INTRAVENOUS | Status: DC | PRN

## 2012-07-09 MED ORDER — PROPOFOL 10 MG/ML IV BOLUS
INTRAVENOUS | Status: DC | PRN
  Administered 2012-07-09: 200 mg via INTRAVENOUS

## 2012-07-09 MED ORDER — LACTATED RINGERS IV SOLN
INTRAVENOUS | Status: DC
  Administered 2012-07-09 (×2): via INTRAVENOUS

## 2012-07-09 MED ORDER — SODIUM CHLORIDE 0.45 % IV SOLN: INTRAVENOUS | Status: DC

## 2012-07-09 MED ORDER — LIDOCAINE HCL (CARDIAC) 20 MG/ML IV SOLN
INTRAVENOUS | Status: DC | PRN
  Administered 2012-07-09: 60 mg via INTRAVENOUS

## 2012-07-09 MED ORDER — PROMETHAZINE HCL 25 MG/ML IJ SOLN: 6.2500 mg | INTRAMUSCULAR | Status: DC | PRN

## 2012-07-09 MED ORDER — BUPIVACAINE-EPINEPHRINE 0.5% -1:200000 IJ SOLN
INTRAMUSCULAR | Status: DC | PRN
  Administered 2012-07-09: 6 mL

## 2012-07-09 MED ORDER — ONDANSETRON HCL 4 MG/2ML IJ SOLN: 4.0000 mg | Freq: Four times a day (QID) | INTRAMUSCULAR | Status: DC | PRN

## 2012-07-09 MED ORDER — BELLADONNA ALKALOIDS-OPIUM 16.2-60 MG RE SUPP
RECTAL | Status: DC | PRN
  Administered 2012-07-09: 1 via RECTAL

## 2012-07-09 MED ORDER — SODIUM CHLORIDE 0.9 % IR SOLN
Status: DC | PRN
  Administered 2012-07-09: 08:00:00

## 2012-07-09 MED ORDER — CEFAZOLIN SODIUM-DEXTROSE 2-3 GM-% IV SOLR
2.0000 g | INTRAVENOUS | Status: AC
  Administered 2012-07-09: 2 g via INTRAVENOUS

## 2012-07-09 MED ORDER — ACETAMINOPHEN 325 MG PO TABS: 650.0000 mg | ORAL_TABLET | ORAL | Status: DC | PRN

## 2012-07-09 MED ORDER — ACETAMINOPHEN 650 MG RE SUPP: 650.0000 mg | RECTAL | Status: DC | PRN

## 2012-07-09 MED ORDER — STERILE WATER FOR IRRIGATION IR SOLN
Status: DC | PRN
  Administered 2012-07-09: 10 mL

## 2012-07-09 MED ORDER — SODIUM CHLORIDE 0.9 % IJ SOLN
INTRAMUSCULAR | Status: DC | PRN
  Administered 2012-07-09: 6 mL via INTRAVENOUS

## 2012-07-09 MED ORDER — FENTANYL CITRATE 0.05 MG/ML IJ SOLN
INTRAMUSCULAR | Status: DC | PRN
  Administered 2012-07-09 (×2): 50 ug via INTRAVENOUS
  Administered 2012-07-09: 25 ug via INTRAVENOUS
  Administered 2012-07-09: 50 ug via INTRAVENOUS
  Administered 2012-07-09: 25 ug via INTRAVENOUS

## 2012-07-09 MED ORDER — MIDAZOLAM HCL 5 MG/5ML IJ SOLN
INTRAMUSCULAR | Status: DC | PRN
  Administered 2012-07-09: 2 mg via INTRAVENOUS

## 2012-07-09 MED ORDER — SODIUM CHLORIDE 0.9 % IJ SOLN: 3.0000 mL | INTRAMUSCULAR | Status: DC | PRN

## 2012-07-09 MED ORDER — ACETAMINOPHEN 10 MG/ML IV SOLN
INTRAVENOUS | Status: DC | PRN
  Administered 2012-07-09: 1000 mg via INTRAVENOUS

## 2012-07-09 MED ORDER — CHLORHEXIDINE GLUCONATE 4 % EX LIQD
Freq: Once | CUTANEOUS | Status: DC
Start: 2012-07-09 — End: 2012-07-09

## 2012-07-09 MED ORDER — HYDROCODONE-ACETAMINOPHEN 7.5-650 MG PO TABS: 1.0000 | ORAL_TABLET | Freq: Four times a day (QID) | ORAL | Status: DC | PRN

## 2012-07-09 MED ORDER — OXYCODONE HCL 5 MG PO TABS
5.0000 mg | ORAL_TABLET | ORAL | Status: DC | PRN
  Administered 2012-07-09: 5 mg via ORAL

## 2012-07-09 MED ORDER — SODIUM CHLORIDE 0.9 % IJ SOLN: 3.0000 mL | Freq: Two times a day (BID) | INTRAMUSCULAR | Status: DC

## 2012-07-09 MED ORDER — DEXAMETHASONE SODIUM PHOSPHATE 4 MG/ML IJ SOLN
INTRAMUSCULAR | Status: DC | PRN
  Administered 2012-07-09: 8 mg via INTRAVENOUS

## 2012-07-09 MED ORDER — FENTANYL CITRATE 0.05 MG/ML IJ SOLN: 25.0000 ug | INTRAMUSCULAR | Status: DC | PRN

## 2012-07-09 SURGICAL SUPPLY — 58 items
ADH SKN CLS APL DERMABOND .7 (GAUZE/BANDAGES/DRESSINGS)
BAG URINE DRAINAGE (UROLOGICAL SUPPLIES) ×3 IMPLANT
BLADE SURG 10 STRL SS (BLADE) ×3 IMPLANT
BLADE SURG 15 STRL LF DISP TIS (BLADE) ×2 IMPLANT
BLADE SURG 15 STRL SS (BLADE) ×3
BLADE SURG ROTATE 9660 (MISCELLANEOUS) ×3 IMPLANT
BOOTIES KNEE HIGH SLOAN (MISCELLANEOUS) ×3 IMPLANT
CANISTER SUCTION 1200CC (MISCELLANEOUS) IMPLANT
CANISTER SUCTION 2500CC (MISCELLANEOUS) ×6 IMPLANT
CATH FOLEY 2WAY SLVR  5CC 16FR (CATHETERS) ×1
CATH FOLEY 2WAY SLVR 5CC 16FR (CATHETERS) ×2 IMPLANT
CLOTH BEACON ORANGE TIMEOUT ST (SAFETY) ×3 IMPLANT
COVER MAYO STAND STRL (DRAPES) ×3 IMPLANT
COVER TABLE BACK 60X90 (DRAPES) ×3 IMPLANT
DERMABOND ADVANCED (GAUZE/BANDAGES/DRESSINGS)
DERMABOND ADVANCED .7 DNX12 (GAUZE/BANDAGES/DRESSINGS) IMPLANT
DEVICE CAPIO SLIM BOX (INSTRUMENTS) IMPLANT
DISSECTOR ROUND CHERRY 3/8 STR (MISCELLANEOUS) IMPLANT
DRAPE CAMERA CLOSED 9X96 (DRAPES) ×3 IMPLANT
DRAPE UNDERBUTTOCKS STRL (DRAPE) ×3 IMPLANT
FLOSEAL 10ML (HEMOSTASIS) IMPLANT
GAUZE SPONGE 4X4 16PLY XRAY LF (GAUZE/BANDAGES/DRESSINGS) IMPLANT
GLOVE BIO SURGEON STRL SZ 6.5 (GLOVE) ×3 IMPLANT
GLOVE BIO SURGEON STRL SZ7.5 (GLOVE) ×3 IMPLANT
GOWN PREVENTION PLUS LG XLONG (DISPOSABLE) ×3 IMPLANT
GOWN STRL REIN XL XLG (GOWN DISPOSABLE) ×3 IMPLANT
NDL 1/2 CIR CATGUT .05X1.09 (NEEDLE) IMPLANT
NEEDLE 1/2 CIR CATGUT .05X1.09 (NEEDLE) IMPLANT
NEEDLE HYPO 22GX1.5 SAFETY (NEEDLE) ×6 IMPLANT
PACKING VAGINAL (PACKING) ×3 IMPLANT
PENCIL BUTTON HOLSTER BLD 10FT (ELECTRODE) ×3 IMPLANT
PLUG CATH AND CAP STER (CATHETERS) ×3 IMPLANT
RETRACTOR LONRSTAR 16.6X16.6CM (MISCELLANEOUS) ×2 IMPLANT
RETRACTOR STAY HOOK 5MM (MISCELLANEOUS) ×3 IMPLANT
RETRACTOR STER APS 16.6X16.6CM (MISCELLANEOUS) ×3
SET IRRIG Y TYPE TUR BLADDER L (SET/KITS/TRAYS/PACK) ×3 IMPLANT
SHEET LAVH (DRAPES) ×3 IMPLANT
SLING SOLYX SYSTEM SIS BX (SLING) IMPLANT
SPONGE LAP 4X18 X RAY DECT (DISPOSABLE) ×3 IMPLANT
SUCTION FRAZIER TIP 10 FR DISP (SUCTIONS) ×3 IMPLANT
SUT ABS MONO DBL WITH NDL 48IN (SUTURE) IMPLANT
SUT ETHILON 2 0 PS N (SUTURE) IMPLANT
SUT MON AB 2-0 SH 27 (SUTURE)
SUT MON AB 2-0 SH27 (SUTURE) IMPLANT
SUT NONABSORB MONO DB W/NDL 48 (SUTURE) IMPLANT
SUT PDS AB 3-0 SH 27 (SUTURE) IMPLANT
SUT SILK 3 0 PS 1 (SUTURE) IMPLANT
SUT VIC AB 0 CT1 36 (SUTURE) IMPLANT
SUT VIC AB 2-0 CT1 27 (SUTURE)
SUT VIC AB 2-0 CT1 TAPERPNT 27 (SUTURE) IMPLANT
SUT VIC AB 2-0 UR6 27 (SUTURE) ×3 IMPLANT
SYR BULB IRRIGATION 50ML (SYRINGE) ×3 IMPLANT
SYR CONTROL 10ML LL (SYRINGE) ×3 IMPLANT
SYRINGE 10CC LL (SYRINGE) ×3 IMPLANT
TRAY DSU PREP LF (CUSTOM PROCEDURE TRAY) ×3 IMPLANT
TUBE CONNECTING 12X1/4 (SUCTIONS) ×6 IMPLANT
WATER STERILE IRR 500ML POUR (IV SOLUTION) ×6 IMPLANT
YANKAUER SUCT BULB TIP NO VENT (SUCTIONS) IMPLANT

## 2012-07-09 NOTE — Transfer of Care (Signed)
Immediate Anesthesia Transfer of Care Note  Patient: Margaret Barker  Procedure(s) Performed: Procedure(s) (LRB): EXCISION VAGINAL LESION (N/A)  Patient Location: PACU  Anesthesia Type: General  Level of Consciousness: awake, oriented, sedated and patient cooperative  Airway & Oxygen Therapy: Patient Spontanous Breathing and Patient connected to face mask oxygen  Post-op Assessment: Report given to PACU RN and Post -op Vital signs reviewed and stable  Post vital signs: Reviewed and stable  Complications: No apparent anesthesia complications

## 2012-07-09 NOTE — Anesthesia Procedure Notes (Signed)
Procedure Name: LMA Insertion Date/Time: 07/09/2012 7:36 AM Performed by: Renella Cunas D Pre-anesthesia Checklist: Patient identified, Emergency Drugs available, Suction available and Patient being monitored Patient Re-evaluated:Patient Re-evaluated prior to inductionOxygen Delivery Method: Circle System Utilized Preoxygenation: Pre-oxygenation with 100% oxygen Intubation Type: IV induction Ventilation: Mask ventilation without difficulty LMA: LMA inserted LMA Size: 4.0 Number of attempts: 1 Airway Equipment and Method: bite block Placement Confirmation: positive ETCO2 Tube secured with: Tape Dental Injury: Teeth and Oropharynx as per pre-operative assessment

## 2012-07-09 NOTE — Op Note (Signed)
Pre-operative diagnosis : Dyspareunia secondary to vaginal apical scar and possible vaginal mesh bunching same  Postoperative diagnosis:  Operation: Examination under anesthesia with vaginal apical scar excision  Surgeon:  S. Patsi Sears, MD  First assistant: None  [id number]}  Preparation: After appropriate preanesthesia, the patient was brought to the operating room, and placed on the operating table in the dorsal supine position, where general LMA anesthesia was introduced. Armband was double checked. She was replaced in the dorsal lithotomy position where the pubis was prepped with Betadine solution, and draped in usual fashion.  Review history:History of Present Illness: 41 yo female post [facility] anterior vault repair for grade 3 cystocele in 2010, now returns c/o point tenderness in the L para apical area with exam showing possible mesh palpable in the tissue under th evaginal epithelium. She is to have exploration this am and apical mesh removal. She understands that this removal will not affect her dyspareunia related to her vaginal orifice pain.    Statement of  Likelihood of Success: Excellent. TIME-OUT observed.:  Procedure: Vaginal examination revealed vaginal apical scar. Close the vaginal inspection revealed no evidence of vaginal mesh extrusion. The patient had previously noted point tenderness on the left side of the vaginal apex, where scarring was noted. It was felt that this may represent bunching of mesh tissue. This was marked with a blue marking pen. The area was injected with Marcaine 0.25% with epinephrine 1 200,000. An elliptical incision was then made removing 2 cm of tissue. No mesh was identified. Minimal bleeding was noted. Edges were cauterized. The wound was irrigated with antibiotic irrigation. Further evaluation revealed no evidence of mesh in the area.  The area was closed vertically with running 2-0 Vicryl suture with minimal tension.  I elected to  not excise further apical scar, because it was not painful on examination in the office.  The patient was awakened, taken to recovery room in good condition. The operative procedure was discussed with the patient's husband.

## 2012-07-09 NOTE — Anesthesia Postprocedure Evaluation (Signed)
  Anesthesia Post-op Note  Patient: Margaret Barker  Procedure(s) Performed: Procedure(s) (LRB): EXCISION VAGINAL LESION (N/A)  Patient Location: PACU  Anesthesia Type: General  Level of Consciousness: awake and alert   Airway and Oxygen Therapy: Patient Spontanous Breathing  Post-op Pain: mild  Post-op Assessment: Post-op Vital signs reviewed, Patient's Cardiovascular Status Stable, Respiratory Function Stable, Patent Airway and No signs of Nausea or vomiting  Post-op Vital Signs: stable  Complications: No apparent anesthesia complications

## 2012-07-09 NOTE — H&P (Signed)
Urology Admission H&P  Chief Complaint: dyspaurnea and point vaginal tenderness over area of vaginal apical mesh.  History of Present Illness: 41 yo female post P[employer] anterior vault repair for grade 3 cystocele in 2010, now returns c/o point tenderness in the L para apical area with exam showing possible mesh palpable in the tissue under th evaginal epithelium. She is to have exploration this am and apical mesh removal. She understands that this removal will not affect her dyspareunia related to her vaginal orifice pain.   Past Medical History  Diagnosis Date  . GERD (gastroesophageal reflux disease)   . Anxiety disorder   . Alcoholism   . Bipolar disorder   . Anxiety   . Depression   . Panic attacks   . Female pelvic pain   . Frequency of urination   . SUI (stress urinary incontinence, female)   . Nocturia    Past Surgical History  Procedure Date  . Sacrospinus anterior culposuspension with uphold mesh and solyx single incision transurethral sling 08-09-2009 DR Raistlin Gum    STRESS INCONTINENCE W/ PELVIC FLOOR PROLAPSE  . Left breast excisional bx 03-26-2007    BENIGN  . Laparoscopic assisted vaginal hysterectomy 10-22-2006  . Right excisional breast bx 10-23-2005    BENIGN  . Cholecystectomy 1992  . D & c hysteroscopy w/ polyp removal 04-22-2006    Home Medications:  Prescriptions prior to admission  Medication Sig Dispense Refill  . ALPRAZolam (XANAX) 0.5 MG tablet Take 0.5 mg by mouth 3 (three) times daily as needed. 1 tab by mouth three times a day as needed anxiety      . ARIPiprazole (ABILIFY) 2 MG tablet Take 2 mg by mouth daily.      Marland Kitchen atorvastatin (LIPITOR) 40 MG tablet Take 40 mg by mouth every morning.      . escitalopram (LEXAPRO) 20 MG tablet Take 20 mg by mouth daily.       . montelukast (SINGULAIR) 10 MG tablet Take 10 mg by mouth at bedtime.      Marland Kitchen omeprazole (PRILOSEC) 20 MG capsule Take 20 mg by mouth as needed.      . solifenacin (VESICARE) 10 MG  tablet Take 10 mg by mouth daily.      . traZODone (DESYREL) 100 MG tablet Take 100 mg by mouth at bedtime.      . Albuterol (VENTOLIN IN) Inhale into the lungs as needed.       Allergies: No Known Allergies  Family History  Problem Relation Age of Onset  . Hypertension    . Diabetes Mother   . Cervical cancer     Social History:  reports that she has been smoking Cigarettes.  She has a 12 pack-year smoking history. She has never used smokeless tobacco. She reports that she drinks alcohol. She reports that she uses illicit drugs.  Review of Systems  Constitutional: Negative.  Negative for fever, chills, weight loss, malaise/fatigue and diaphoresis.  HENT: Negative.   Eyes: Negative.   Respiratory: Negative.   Cardiovascular: Negative.  Negative for leg swelling.  Gastrointestinal: Negative.  Negative for abdominal pain, diarrhea and constipation.  Genitourinary: Negative.  Negative for dysuria, urgency, frequency and hematuria.  Musculoskeletal: Negative.   Skin: Negative.   Neurological: Negative.  Negative for tingling, focal weakness and weakness.  Endo/Heme/Allergies: Negative.   Psychiatric/Behavioral: Negative.   All other systems reviewed and are negative.    Physical Exam:  Vital signs in last 24 hours: Temp:  [97.3 F (36.3 C)]  97.3 F (36.3 C) (09/27 0644) Pulse Rate:  [59] 59  (09/27 0644) Resp:  [18] 18  (09/27 0644) BP: (98)/(58) 98/58 mmHg (09/27 0644) SpO2:  [96 %] 96 % (09/27 0644) Weight:  [69.088 kg (152 lb 5 oz)] 69.088 kg (152 lb 5 oz) (09/27 0981) Physical Exam  Vitals reviewed. Constitutional: She is oriented to person, place, and time. She appears well-developed and well-nourished.  HENT:  Head: Normocephalic.  Eyes: Pupils are equal, round, and reactive to light.  Neck: Normal range of motion.  Respiratory: Effort normal.  GI: Soft. Bowel sounds are normal. She exhibits no distension. There is no tenderness.  Genitourinary: Vagina normal. No  vaginal discharge found.       Point tenderness L para apical area with palpation of mesh under the vaginal epithelium.   Musculoskeletal: Normal range of motion.  Neurological: She is alert and oriented to person, place, and time.  Skin: Skin is warm and dry.  Psychiatric: She has a normal mood and affect.    Laboratory Data:  Results for orders placed during the hospital encounter of 07/09/12 (from the past 24 hour(s))  POCT HEMOGLOBIN-HEMACUE     Status: Normal   Collection Time   07/09/12  6:45 AM      Component Value Range   Hemoglobin 14.8  12.0 - 15.0 g/dL   No results found for this or any previous visit (from the past 240 hour(s)). Creatinine: No results found for this basename: CREATININE:7 in the last 168 hours Baseline Creatinine:   Impression/Assessment:  Pt desires to have vaginal apical mesh removed to see if it will improve her dyspareunia. She may need Xenform biologic placed if large amount of mesh is removed.   Plan:  For surgery this AM.   Terryann Verbeek I 07/09/2012, 7:26 AM

## 2012-07-09 NOTE — Interval H&P Note (Signed)
History and Physical Interval Note:  07/09/2012 7:40 AM  Margaret Barker  has presented today for surgery, with the diagnosis of Pelvic Pain  The various methods of treatment have been discussed with the patient and family. After consideration of risks, benefits and other options for treatment, the patient has consented to  Procedure(s) (LRB) with comments: PUBO-VAGINAL SLING (N/A) - 2 hours requested for this case  EXCISION OF [employer] SCIENTIFIC ANTERIOR APICAL MESH ON LEFT SIDE POSSIBLE XENFORM PLACEMENT (SMALL)   Equiphment: Uphold LITE w/[employer]io [employer] number [id number] qty 2 Xenform soft tissue repair matrix 6x7 cm [id number] qty 3 Capio SLIM qty 2  [employer] Rep will be present during case as a surgical intervention .  The patient's history has been reviewed, patient examined, no change in status, stable for surgery.  I have reviewed the patient's chart and labs.  Questions were answered to the patient's satisfaction.     Jethro Bolus I

## 2012-07-12 ENCOUNTER — Encounter (HOSPITAL_BASED_OUTPATIENT_CLINIC_OR_DEPARTMENT_OTHER): Payer: Self-pay | Admitting: Urology

## 2012-07-21 DIAGNOSIS — N9489 Other specified conditions associated with female genital organs and menstrual cycle: Secondary | ICD-10-CM | POA: Diagnosis not present

## 2012-07-26 DIAGNOSIS — F39 Unspecified mood [affective] disorder: Secondary | ICD-10-CM | POA: Diagnosis not present

## 2012-08-13 DIAGNOSIS — N9489 Other specified conditions associated with female genital organs and menstrual cycle: Secondary | ICD-10-CM | POA: Diagnosis not present

## 2012-08-20 DIAGNOSIS — E785 Hyperlipidemia, unspecified: Secondary | ICD-10-CM | POA: Diagnosis not present

## 2012-08-20 DIAGNOSIS — Z23 Encounter for immunization: Secondary | ICD-10-CM | POA: Diagnosis not present

## 2012-08-23 DIAGNOSIS — H00019 Hordeolum externum unspecified eye, unspecified eyelid: Secondary | ICD-10-CM | POA: Diagnosis not present

## 2012-11-15 DIAGNOSIS — F39 Unspecified mood [affective] disorder: Secondary | ICD-10-CM | POA: Diagnosis not present

## 2012-12-16 DIAGNOSIS — B0089 Other herpesviral infection: Secondary | ICD-10-CM | POA: Diagnosis not present

## 2013-01-26 ENCOUNTER — Telehealth: Payer: Self-pay | Admitting: Family Medicine

## 2013-01-26 ENCOUNTER — Other Ambulatory Visit: Payer: Self-pay | Admitting: Nurse Practitioner

## 2013-01-26 MED ORDER — VALACYCLOVIR HCL 1 G PO TABS: 1000.0000 mg | ORAL_TABLET | Freq: Two times a day (BID) | ORAL | Status: DC

## 2013-01-26 NOTE — Telephone Encounter (Signed)
Please advise chart on desk 

## 2013-01-26 NOTE — Telephone Encounter (Signed)
Patient aware.

## 2013-01-26 NOTE — Telephone Encounter (Signed)
Pt seen in march by acm and received valtrex. Needs refill on Valtrex 1 gram po bid #60 to [facility]. Thanks.

## 2013-01-27 ENCOUNTER — Other Ambulatory Visit: Payer: Self-pay | Admitting: Nurse Practitioner

## 2013-01-31 ENCOUNTER — Encounter: Payer: Self-pay | Admitting: *Deleted

## 2013-02-18 ENCOUNTER — Telehealth: Payer: Self-pay | Admitting: Nurse Practitioner

## 2013-02-18 MED ORDER — NICOTINE 14 MG/24HR TD PT24: 1.0000 | MEDICATED_PATCH | TRANSDERMAL | Status: DC

## 2013-02-18 NOTE — Telephone Encounter (Signed)
Please advise 

## 2013-02-18 NOTE — Telephone Encounter (Signed)
Patient aware.

## 2013-02-18 NOTE — Telephone Encounter (Signed)
OTC and insurance will not pay for them- But sent rx to [facility] just  in case

## 2013-02-21 DIAGNOSIS — F39 Unspecified mood [affective] disorder: Secondary | ICD-10-CM | POA: Diagnosis not present

## 2013-02-28 ENCOUNTER — Telehealth: Payer: Self-pay | Admitting: Nurse Practitioner

## 2013-02-28 NOTE — Telephone Encounter (Signed)
APT MADE 

## 2013-03-01 ENCOUNTER — Encounter: Payer: Self-pay | Admitting: Physician Assistant

## 2013-03-01 ENCOUNTER — Ambulatory Visit (INDEPENDENT_AMBULATORY_CARE_PROVIDER_SITE_OTHER): Payer: Medicare Other | Admitting: Physician Assistant

## 2013-03-01 VITALS — BP 97/69 | HR 62 | Temp 97.6°F | Ht 65.0 in | Wt 156.2 lb

## 2013-03-01 DIAGNOSIS — H5712 Ocular pain, left eye: Secondary | ICD-10-CM

## 2013-03-01 DIAGNOSIS — H5710 Ocular pain, unspecified eye: Secondary | ICD-10-CM

## 2013-03-01 DIAGNOSIS — H02849 Edema of unspecified eye, unspecified eyelid: Secondary | ICD-10-CM | POA: Diagnosis not present

## 2013-03-01 NOTE — Patient Instructions (Signed)
Pt to got to Dr Vision for 245 appt

## 2013-03-01 NOTE — Progress Notes (Signed)
Subjective:     Patient ID: Margaret Barker, female   DOB: 25-Apr-1971, 42 y.o.   MRN: [id number]  HPI Pt with onset of swelling and pain to the inner aspect of the L eye Denies change in vision No trauma to the area No drainage from site + hx of herpetic whitlow to the R thumb Currently taking Valtrex bid  Review of Systems  All other systems reviewed and are negative.      Objective:   Physical Exam + erythema and edema to the inner canthus of the L eye No drainage from site Under magnification appearance of small vesicles No drainage/ulceration seen PERRLA  EOMI No lid edema     Assessment:     Eye pain- ? herpetic    Plan:     Increase Valtrex to tid Immed refer to Opth to make sure no involvement of eye F/U prn

## 2013-03-21 ENCOUNTER — Other Ambulatory Visit: Payer: Self-pay | Admitting: Nurse Practitioner

## 2013-03-31 ENCOUNTER — Encounter: Payer: Self-pay | Admitting: Nurse Practitioner

## 2013-03-31 ENCOUNTER — Telehealth: Payer: Self-pay | Admitting: Physician Assistant

## 2013-03-31 ENCOUNTER — Ambulatory Visit (INDEPENDENT_AMBULATORY_CARE_PROVIDER_SITE_OTHER): Payer: Medicare Other | Admitting: Nurse Practitioner

## 2013-03-31 VITALS — BP 108/70 | HR 73 | Temp 97.8°F | Ht 65.0 in | Wt 145.0 lb

## 2013-03-31 DIAGNOSIS — L02838 Carbuncle of other sites: Secondary | ICD-10-CM | POA: Diagnosis not present

## 2013-03-31 DIAGNOSIS — L259 Unspecified contact dermatitis, unspecified cause: Secondary | ICD-10-CM

## 2013-03-31 DIAGNOSIS — L02828 Furuncle of other sites: Secondary | ICD-10-CM | POA: Diagnosis not present

## 2013-03-31 MED ORDER — CEPHALEXIN 500 MG PO CAPS: 500.0000 mg | ORAL_CAPSULE | Freq: Three times a day (TID) | ORAL | Status: DC

## 2013-03-31 MED ORDER — CLOTRIMAZOLE-BETAMETHASONE 1-0.05 % EX CREA: TOPICAL_CREAM | Freq: Two times a day (BID) | CUTANEOUS | Status: DC

## 2013-03-31 NOTE — Progress Notes (Signed)
  Subjective:    Patient ID: Margaret Barker, female    DOB: Jan 17, 1971, 42 y.o.   MRN: [id number]  HPI 1. Patient in C/o burning rash bil axillary area- Patient says that this rash comes and goes- Secret deodorant. 2. Pumps in private area that started about a year ago. They come and go- painful- not blister appearing.   Review of Systems  All other systems reviewed and are negative.       Objective:   Physical Exam  Constitutional: She appears well-developed and well-nourished.  Cardiovascular: Normal rate and normal heart sounds.   Pulmonary/Chest: Effort normal and breath sounds normal.  Abdominal: Soft. Bowel sounds are normal.  Skin:  2cm annular indurated lesion left pubic area.  Erythematous dry rash axillary area.     BP 108/70  Pulse 73  Temp(Src) 97.8 F (36.6 C) (Oral)  Ht 5\' 5"  (1.651 m)  Wt 145 lb (65.772 kg)  BMI 24.13 kg/m2      Assessment & Plan:  1. Carbuncle of other sites Do not pick at lesion Moist warm compresses RTO prn Clean area with betadine - cephALEXin (KEFLEX) 500 MG capsule; Take 1 capsule (500 mg total) by mouth 3 (three) times daily.  Dispense: 30 capsule; Refill: 0  2. Contact dermatitis Dove deodorant lotrisone cream BD as rx Do not scratch at area Do not use cheap razors when shaving  Mary-Margaret Daphine Deutscher, FNP

## 2013-03-31 NOTE — Patient Instructions (Signed)
.   Carbuncle of other sites Do not pick at lesion Moist warm compresses RTO prn Clean area with betadine - cephALEXin (KEFLEX) 500 MG capsule; Take 1 capsule (500 mg total) by mouth 3 (three) times daily.  Dispense: 30 capsule; Refill: 0  2. Contact dermatitis Dove deodorant lotrisone cream BD as rx Do not scratch at area Do not use cheap razors when shaving  Mary-Margaret Daphine Deutscher, FNP

## 2013-04-18 ENCOUNTER — Telehealth: Payer: Self-pay | Admitting: Nurse Practitioner

## 2013-04-18 ENCOUNTER — Other Ambulatory Visit: Payer: Self-pay | Admitting: Family Medicine

## 2013-04-18 NOTE — Telephone Encounter (Signed)
Advise

## 2013-04-19 ENCOUNTER — Telehealth: Payer: Self-pay | Admitting: *Deleted

## 2013-04-19 NOTE — Telephone Encounter (Signed)
done

## 2013-04-22 ENCOUNTER — Other Ambulatory Visit: Payer: Self-pay

## 2013-04-22 NOTE — Telephone Encounter (Signed)
Last lipids 2012  Last seen 12/16/12  ACM

## 2013-04-23 ENCOUNTER — Other Ambulatory Visit: Payer: Self-pay | Admitting: Nurse Practitioner

## 2013-04-25 MED ORDER — ATORVASTATIN CALCIUM 40 MG PO TABS: 40.0000 mg | ORAL_TABLET | Freq: Every day | ORAL | Status: DC

## 2013-04-26 ENCOUNTER — Ambulatory Visit: Payer: Self-pay | Admitting: General Practice

## 2013-05-03 ENCOUNTER — Ambulatory Visit: Payer: Self-pay | Admitting: Family Medicine

## 2013-05-10 ENCOUNTER — Ambulatory Visit: Payer: Self-pay | Admitting: Family Medicine

## 2013-05-10 ENCOUNTER — Other Ambulatory Visit: Payer: Self-pay | Admitting: *Deleted

## 2013-05-12 ENCOUNTER — Other Ambulatory Visit: Payer: Self-pay | Admitting: Family Medicine

## 2013-05-13 NOTE — Telephone Encounter (Signed)
Last seen 03/31/13  MMM  Last lipids 09/01/11

## 2013-05-17 ENCOUNTER — Ambulatory Visit: Payer: Self-pay | Admitting: General Practice

## 2013-05-23 DIAGNOSIS — F329 Major depressive disorder, single episode, unspecified: Secondary | ICD-10-CM | POA: Diagnosis not present

## 2013-05-26 ENCOUNTER — Other Ambulatory Visit: Payer: Self-pay | Admitting: Nurse Practitioner

## 2013-05-30 NOTE — Telephone Encounter (Signed)
Last seen 03/31/13  MMM 

## 2013-06-01 ENCOUNTER — Telehealth: Payer: Self-pay | Admitting: General Practice

## 2013-06-01 NOTE — Telephone Encounter (Signed)
Patient has a history of fibrocystic breast disease and is having some breast pain. Last mammogram was 2 years ago. Appt scheduled for tomorrow.   Patient aware.

## 2013-06-02 ENCOUNTER — Encounter: Payer: Self-pay | Admitting: General Practice

## 2013-06-02 ENCOUNTER — Ambulatory Visit (INDEPENDENT_AMBULATORY_CARE_PROVIDER_SITE_OTHER): Payer: Medicare Other | Admitting: General Practice

## 2013-06-02 VITALS — BP 107/70 | HR 68 | Temp 97.3°F | Ht 65.0 in | Wt 146.0 lb

## 2013-06-02 DIAGNOSIS — N644 Mastodynia: Secondary | ICD-10-CM | POA: Diagnosis not present

## 2013-06-02 NOTE — Progress Notes (Signed)
  Subjective:    Patient ID: Margaret Barker, female    DOB: 09/22/1971, 42 y.o.   MRN: [id number]  HPI Patient presents today with complaints of swelling in breast do to fibroid cysts. She reports having a history of these cysts. She reports having a mammogram two years ago.    Review of Systems  Constitutional: Negative for fever and chills.  Respiratory: Negative for chest tightness and shortness of breath.   Cardiovascular: Negative for chest pain and palpitations.       Objective:   Physical Exam  Constitutional: She is oriented to person, place, and time. She appears well-developed and well-nourished.  Cardiovascular: Normal rate, regular rhythm and normal heart sounds.   Pulmonary/Chest: Effort normal and breath sounds normal. No respiratory distress. She exhibits tenderness. Right breast exhibits tenderness. Left breast exhibits tenderness.  Neurological: She is alert and oriented to person, place, and time.  Skin: Skin is warm and dry.  Psychiatric: She has a normal mood and affect.          Assessment & Plan:  1. Pain in breast - MM Digital Diagnostic Bilat; Future -RTO if symptoms worsen -maintain appointment once scheduled for mammogram -Patient verbalized understanding -Coralie Keens, FNP-C

## 2013-06-14 ENCOUNTER — Other Ambulatory Visit: Payer: Self-pay | Admitting: Nurse Practitioner

## 2013-06-23 ENCOUNTER — Ambulatory Visit
Admission: RE | Admit: 2013-06-23 | Discharge: 2013-06-23 | Disposition: A | Discharge status: Home / Self Care | Payer: Medicare Other | Source: Ambulatory Visit | Attending: General Practice | Admitting: General Practice

## 2013-06-23 DIAGNOSIS — N644 Mastodynia: Secondary | ICD-10-CM

## 2013-06-27 ENCOUNTER — Ambulatory Visit (INDEPENDENT_AMBULATORY_CARE_PROVIDER_SITE_OTHER): Payer: Medicare Other | Admitting: Family Medicine

## 2013-06-27 ENCOUNTER — Encounter: Payer: Self-pay | Admitting: Family Medicine

## 2013-06-27 VITALS — BP 104/71 | HR 72 | Temp 97.5°F | Ht 65.0 in | Wt 145.4 lb

## 2013-06-27 DIAGNOSIS — Z9109 Other allergy status, other than to drugs and biological substances: Secondary | ICD-10-CM

## 2013-06-27 DIAGNOSIS — Z720 Tobacco use: Secondary | ICD-10-CM

## 2013-06-27 DIAGNOSIS — F17200 Nicotine dependence, unspecified, uncomplicated: Secondary | ICD-10-CM | POA: Diagnosis not present

## 2013-06-27 DIAGNOSIS — Z889 Allergy status to unspecified drugs, medicaments and biological substances status: Secondary | ICD-10-CM

## 2013-06-27 DIAGNOSIS — E785 Hyperlipidemia, unspecified: Secondary | ICD-10-CM | POA: Diagnosis not present

## 2013-06-27 LAB — POCT CBC
Granulocyte percent: 72.8 %G (ref 37–80)
HCT, POC: 45.1 % (ref 37.7–47.9)
Hemoglobin: 15.3 g/dL (ref 12.2–16.2)
Lymph, poc: 2.6 (ref 0.6–3.4)
MCH, POC: 33.7 pg — AB (ref 27–31.2)
MCHC: 33.9 g/dL (ref 31.8–35.4)
MCV: 99.3 fL — AB (ref 80–97)
MPV: 7.5 fL (ref 0–99.8)
POC Granulocyte: 7.3 — AB (ref 2–6.9)
POC LYMPH PERCENT: 26.1 %L (ref 10–50)
Platelet Count, POC: 250 10*3/uL (ref 142–424)
RBC: 4.6 M/uL (ref 4.04–5.48)
RDW, POC: 14.3 %
WBC: 10 10*3/uL (ref 4.6–10.2)

## 2013-06-27 MED ORDER — NICOTINE 10 MG/ML NA SOLN: NASAL | Status: DC

## 2013-06-27 MED ORDER — ATORVASTATIN CALCIUM 40 MG PO TABS: 40.0000 mg | ORAL_TABLET | Freq: Every day | ORAL | Status: DC

## 2013-06-27 MED ORDER — MONTELUKAST SODIUM 10 MG PO TABS: 10.0000 mg | ORAL_TABLET | Freq: Every day | ORAL | Status: DC

## 2013-06-27 NOTE — Progress Notes (Signed)
  Subjective:    Patient ID: Margaret Barker, female    DOB: [birth year], 42 y.o.   MRN: [id number]  HPI  This 42 y.o. female presents for evaluation of wanting to quit smoking and refill on her lipitor. She is wanting to try the nicotrol nasal spray to help quit smoking.  She is due for annual labs.  Review of Systems. No chest pain, SOB, HA, dizziness, vision change, N/V, diarrhea, constipation, dysuria, urinary urgency or frequency, myalgias, arthralgias or rash.     Objective:   Physical Exam Vital signs noted  Well developed well nourished female.  HEENT - Head atraumatic Normocephalic                Eyes - PERRLA, Conjuctiva - clear Sclera- Clear EOMI                Ears - EAC's Wnl TM's Wnl Gross Hearing WNL                Nose - Nares patent                 Throat - oropharanx wnl Respiratory - Lungs CTA bilateral Cardiac - RRR S1 and S2 without murmur GI - Abdomen soft Nontender and bowel sounds active x 4 Extremities - No edema. Neuro - Grossly intact.       Assessment & Plan:  Tobacco abuse - Plan: Nicotine (NICOTROL NS) 10 MG/ML SOLN  H/O seasonal allergies - Plan: montelukast (SINGULAIR) 10 MG tablet  Other and unspecified hyperlipidemia - Plan: atorvastatin (LIPITOR) 40 MG tablet, POCT CBC, CMP14+EGFR, Lipid panel, Thyroid Panel With TSH

## 2013-06-27 NOTE — Patient Instructions (Signed)
Smoking Cessation Quitting smoking is important to your health and has many advantages. However, it is not always easy to quit since nicotine is a very addictive drug. Often times, people try 3 times or more before being able to quit. This document explains the best ways for you to prepare to quit smoking. Quitting takes hard work and a lot of effort, but you can do it. ADVANTAGES OF QUITTING SMOKING  You will live longer, feel better, and live better.  Your body will feel the impact of quitting smoking almost immediately.  Within 20 minutes, blood pressure decreases. Your pulse returns to its normal level.  After 8 hours, carbon monoxide levels in the blood return to normal. Your oxygen level increases.  After 24 hours, the chance of having a heart attack starts to decrease. Your breath, hair, and body stop smelling like smoke.  After 48 hours, damaged nerve endings begin to recover. Your sense of taste and smell improve.  After 72 hours, the body is virtually free of nicotine. Your bronchial tubes relax and breathing becomes easier.  After 2 to 12 weeks, lungs can hold more air. Exercise becomes easier and circulation improves.  The risk of having a heart attack, stroke, cancer, or lung disease is greatly reduced.  After 1 year, the risk of coronary heart disease is cut in half.  After 5 years, the risk of stroke falls to the same as a nonsmoker.  After 10 years, the risk of lung cancer is cut in half and the risk of other cancers decreases significantly.  After 15 years, the risk of coronary heart disease drops, usually to the level of a nonsmoker.  If you are pregnant, quitting smoking will improve your chances of having a healthy baby.  The people you live with, especially any children, will be healthier.  You will have extra money to spend on things other than cigarettes. QUESTIONS TO THINK ABOUT BEFORE ATTEMPTING TO QUIT You may want to talk about your answers with your  caregiver.  Why do you want to quit?  If you tried to quit in the past, what helped and what did not?  What will be the most difficult situations for you after you quit? How will you plan to handle them?  Who can help you through the tough times? Your family? Friends? A caregiver?  What pleasures do you get from smoking? What ways can you still get pleasure if you quit? Here are some questions to ask your caregiver:  How can you help me to be successful at quitting?  What medicine do you think would be best for me and how should I take it?  What should I do if I need more help?  What is smoking withdrawal like? How can I get information on withdrawal? GET READY  Set a quit date.  Change your environment by getting rid of all cigarettes, ashtrays, matches, and lighters in your home, car, or work. Do not let people smoke in your home.  Review your past attempts to quit. Think about what worked and what did not. GET SUPPORT AND ENCOURAGEMENT You have a better chance of being successful if you have help. You can get support in many ways.  Tell your family, friends, and co-workers that you are going to quit and need their support. Ask them not to smoke around you.  Get individual, group, or telephone counseling and support. Programs are available at local hospitals and health centers. Call your local health department for   information about programs in your area.  Spiritual beliefs and practices may help some smokers quit.  Download a "quit meter" on your computer to keep track of quit statistics, such as how long you have gone without smoking, cigarettes not smoked, and money saved.  Get a self-help book about quitting smoking and staying off of tobacco. LEARN NEW SKILLS AND BEHAVIORS  Distract yourself from urges to smoke. Talk to someone, go for a walk, or occupy your time with a task.  Change your normal routine. Take a different route to work. Drink tea instead of coffee.  Eat breakfast in a different place.  Reduce your stress. Take a hot bath, exercise, or read a book.  Plan something enjoyable to do every day. Reward yourself for not smoking.  Explore interactive web-based programs that specialize in helping you quit. GET MEDICINE AND USE IT CORRECTLY Medicines can help you stop smoking and decrease the urge to smoke. Combining medicine with the above behavioral methods and support can greatly increase your chances of successfully quitting smoking.  Nicotine replacement therapy helps deliver nicotine to your body without the negative effects and risks of smoking. Nicotine replacement therapy includes nicotine gum, lozenges, inhalers, nasal sprays, and skin patches. Some may be available over-the-counter and others require a prescription.  Antidepressant medicine helps people abstain from smoking, but how this works is unknown. This medicine is available by prescription.  Nicotinic receptor partial agonist medicine simulates the effect of nicotine in your brain. This medicine is available by prescription. Ask your caregiver for advice about which medicines to use and how to use them based on your health history. Your caregiver will tell you what side effects to look out for if you choose to be on a medicine or therapy. Carefully read the information on the package. Do not use any other product containing nicotine while using a nicotine replacement product.  RELAPSE OR DIFFICULT SITUATIONS Most relapses occur within the first 3 months after quitting. Do not be discouraged if you start smoking again. Remember, most people try several times before finally quitting. You may have symptoms of withdrawal because your body is used to nicotine. You may crave cigarettes, be irritable, feel very hungry, cough often, get headaches, or have difficulty concentrating. The withdrawal symptoms are only temporary. They are strongest when you first quit, but they will go away within  10 14 days. To reduce the chances of relapse, try to:  Avoid drinking alcohol. Drinking lowers your chances of successfully quitting.  Reduce the amount of caffeine you consume. Once you quit smoking, the amount of caffeine in your body increases and can give you symptoms, such as a rapid heartbeat, sweating, and anxiety.  Avoid smokers because they can make you want to smoke.  Do not let weight gain distract you. Many smokers will gain weight when they quit, usually less than 10 pounds. Eat a healthy diet and stay active. You can always lose the weight gained after you quit.  Find ways to improve your mood other than smoking. FOR MORE INFORMATION  www.smokefree.gov  Document Released: 09/23/2001 Document Revised: 03/30/2012 Document Reviewed: 01/08/2012 [employer] Patient Information 2014 [employer].  

## 2013-06-28 LAB — CMP14+EGFR
ALT: 26 IU/L (ref 0–32)
AST: 24 IU/L (ref 0–40)
Albumin/Globulin Ratio: 1.9 (ref 1.1–2.5)
Albumin: 4.8 g/dL (ref 3.5–5.5)
Alkaline Phosphatase: 66 IU/L (ref 39–117)
BUN/Creatinine Ratio: 13 (ref 9–23)
BUN: 10 mg/dL (ref 6–24)
CO2: 21 mmol/L (ref 18–29)
Calcium: 9.5 mg/dL (ref 8.7–10.2)
Chloride: 100 mmol/L (ref 97–108)
Creatinine, Ser: 0.76 mg/dL (ref 0.57–1.00)
GFR calc Af Amer: 113 mL/min/{1.73_m2} (ref >59)
GFR calc non Af Amer: 98 mL/min/{1.73_m2} (ref >59)
Globulin, Total: 2.5 g/dL (ref 1.5–4.5)
Glucose: 99 mg/dL (ref 65–99)
Potassium: 4.5 mmol/L (ref 3.5–5.2)
Sodium: 139 mmol/L (ref 134–144)
Total Bilirubin: 0.3 mg/dL (ref 0.0–1.2)
Total Protein: 7.3 g/dL (ref 6.0–8.5)

## 2013-06-28 LAB — LIPID PANEL
Chol/HDL Ratio: 4.4 ratio units (ref 0.0–4.4)
Cholesterol, Total: 229 mg/dL — ABNORMAL HIGH (ref 100–199)
HDL: 52 mg/dL (ref >39)
LDL Calculated: 150 mg/dL — ABNORMAL HIGH (ref 0–99)
Triglycerides: 137 mg/dL (ref 0–149)
VLDL Cholesterol Cal: 27 mg/dL (ref 5–40)

## 2013-06-28 LAB — THYROID PANEL WITH TSH
Free Thyroxine Index: 2.2 (ref 1.2–4.9)
T3 Uptake Ratio: 28 % (ref 24–39)
T4, Total: 7.7 ug/dL (ref 4.5–12.0)
TSH: 1.11 u[IU]/mL (ref 0.450–4.500)

## 2013-06-30 ENCOUNTER — Other Ambulatory Visit: Payer: Self-pay | Admitting: Family Medicine

## 2013-06-30 DIAGNOSIS — E785 Hyperlipidemia, unspecified: Secondary | ICD-10-CM

## 2013-06-30 MED ORDER — ATORVASTATIN CALCIUM 80 MG PO TABS: 80.0000 mg | ORAL_TABLET | Freq: Every day | ORAL | Status: DC

## 2013-07-05 DIAGNOSIS — Z23 Encounter for immunization: Secondary | ICD-10-CM | POA: Diagnosis not present

## 2013-07-06 ENCOUNTER — Telehealth: Payer: Self-pay | Admitting: Family Medicine

## 2013-07-11 ENCOUNTER — Telehealth: Payer: Self-pay | Admitting: Family Medicine

## 2013-07-14 ENCOUNTER — Other Ambulatory Visit: Payer: Self-pay | Admitting: Family Medicine

## 2013-07-14 MED ORDER — FOLIC ACID 1 MG PO TABS: 1.0000 mg | ORAL_TABLET | Freq: Every day | ORAL | Status: DC

## 2013-07-14 NOTE — Telephone Encounter (Signed)
Labs show macrocytosis or elevation in MCV which is attributed to folic acid deficiency so I sent in folic acid 1mg  po qd. It is also otc.

## 2013-07-14 NOTE — Telephone Encounter (Signed)
Pt notified of labs and that RX was sent to [facility] for folic acid Pt verbalizes understanding

## 2013-07-27 ENCOUNTER — Ambulatory Visit (INDEPENDENT_AMBULATORY_CARE_PROVIDER_SITE_OTHER): Payer: Medicare Other | Admitting: Family Medicine

## 2013-07-27 ENCOUNTER — Encounter: Payer: Self-pay | Admitting: Family Medicine

## 2013-07-27 VITALS — BP 114/76 | HR 68 | Temp 97.3°F | Ht 65.0 in | Wt 148.6 lb

## 2013-07-27 DIAGNOSIS — J029 Acute pharyngitis, unspecified: Secondary | ICD-10-CM | POA: Diagnosis not present

## 2013-07-27 DIAGNOSIS — R52 Pain, unspecified: Secondary | ICD-10-CM

## 2013-07-27 LAB — POCT INFLUENZA A/B
Influenza A, POC: NEGATIVE
Influenza B, POC: NEGATIVE

## 2013-07-27 LAB — POCT RAPID STREP A (OFFICE): Rapid Strep A Screen: NEGATIVE

## 2013-07-27 MED ORDER — AZITHROMYCIN 250 MG PO TABS: ORAL_TABLET | ORAL | Status: DC

## 2013-07-27 NOTE — Patient Instructions (Signed)

## 2013-07-27 NOTE — Progress Notes (Signed)
  Subjective:    Patient ID: Margaret Barker, female    DOB: January 16, 1971, 42 y.o.   MRN: [id number]  HPI This 42 y.o. female presents for evaluation of sore throat and URI sx's for over a week.   Review of Systems No chest pain, SOB, HA, dizziness, vision change, N/V, diarrhea, constipation, dysuria, urinary urgency or frequency, myalgias, arthralgias or rash.     Objective:   Physical Exam  Vital signs noted  Well developed well nourished female.  HEENT - Head atraumatic Normocephalic                Eyes - PERRLA, Conjuctiva - clear Sclera- Clear EOMI                Ears - EAC's Wnl TM's Wnl Gross Hearing WNL                Nose - Nares patent                 Throat - oropharanx wnl Respiratory - Lungs CTA bilateral Cardiac - RRR S1 and S2 without murmur GI - Abdomen soft Nontender and bowel sounds active x 4 Extremities - No edema. Neuro - Grossly intact.  Results for orders placed in visit on 07/27/13  POCT INFLUENZA A/B      Result Value Range   Influenza A, POC Negative     Influenza B, POC Negative    POCT RAPID STREP A (OFFICE)      Result Value Range   Rapid Strep A Screen Negative  Negative      Assessment & Plan:  Sore throat - Plan: POCT rapid strep A, azithromycin (ZITHROMAX) 250 MG tablet  Body aches - Plan: POCT Influenza A/B, azithromycin (ZITHROMAX) 250 MG tablet  Deatra Canter FNP

## 2013-08-23 ENCOUNTER — Other Ambulatory Visit: Payer: Self-pay

## 2013-08-23 MED ORDER — OMEPRAZOLE 20 MG PO CPDR: 20.0000 mg | DELAYED_RELEASE_CAPSULE | ORAL | Status: DC | PRN

## 2013-08-26 ENCOUNTER — Other Ambulatory Visit: Payer: Self-pay | Admitting: Nurse Practitioner

## 2013-09-16 ENCOUNTER — Encounter: Payer: Self-pay | Admitting: Family Medicine

## 2013-09-16 ENCOUNTER — Ambulatory Visit (INDEPENDENT_AMBULATORY_CARE_PROVIDER_SITE_OTHER): Payer: Medicare Other | Admitting: Family Medicine

## 2013-09-16 VITALS — BP 112/73 | HR 71 | Temp 97.4°F | Ht 66.0 in | Wt 146.0 lb

## 2013-09-16 DIAGNOSIS — N951 Menopausal and female climacteric states: Secondary | ICD-10-CM | POA: Diagnosis not present

## 2013-09-16 DIAGNOSIS — R232 Flushing: Secondary | ICD-10-CM

## 2013-09-16 DIAGNOSIS — E785 Hyperlipidemia, unspecified: Secondary | ICD-10-CM | POA: Diagnosis not present

## 2013-09-16 LAB — POCT CBC
Granulocyte percent: 68.9 %G (ref 37–80)
HCT, POC: 48.4 % — AB (ref 37.7–47.9)
Hemoglobin: 15.1 g/dL (ref 12.2–16.2)
Lymph, poc: 2.8 (ref 0.6–3.4)
MCH, POC: 31.7 pg — AB (ref 27–31.2)
MCHC: 31.2 g/dL — AB (ref 31.8–35.4)
MCV: 101.6 fL — AB (ref 80–97)
MPV: 7.9 fL (ref 0–99.8)
POC Granulocyte: 6.9 (ref 2–6.9)
POC LYMPH PERCENT: 28.1 %L (ref 10–50)
Platelet Count, POC: 273 10*3/uL (ref 142–424)
RBC: 4.8 M/uL (ref 4.04–5.48)
RDW, POC: 13.9 %
WBC: 10 10*3/uL (ref 4.6–10.2)

## 2013-09-16 MED ORDER — CLONIDINE HCL 0.1 MG PO TABS: ORAL_TABLET | ORAL | Status: DC

## 2013-09-16 NOTE — Patient Instructions (Signed)
Hypertriglyceridemia  Diet for High blood levels of Triglycerides Most fats in food are triglycerides. Triglycerides in your blood are stored as fat in your body. High levels of triglycerides in your blood may put you at a greater risk for heart disease and stroke.  Normal triglyceride levels are less than 150 mg/dL. Borderline high levels are 150-199 mg/dl. High levels are 200 - 499 mg/dL, and very high triglyceride levels are greater than 500 mg/dL. The decision to treat high triglycerides is generally based on the level. For people with borderline or high triglyceride levels, treatment includes weight loss and exercise. Drugs are recommended for people with very high triglyceride levels. Many people who need treatment for high triglyceride levels have metabolic syndrome. This syndrome is a collection of disorders that often include: insulin resistance, high blood pressure, blood clotting problems, high cholesterol and triglycerides. TESTING PROCEDURE FOR TRIGLYCERIDES  You should not eat 4 hours before getting your triglycerides measured. The normal range of triglycerides is between 10 and 250 milligrams per deciliter (mg/dl). Some people may have extreme levels (1000 or above), but your triglyceride level may be too high if it is above 150 mg/dl, depending on what other risk factors you have for heart disease.  People with high blood triglycerides may also have high blood cholesterol levels. If you have high blood cholesterol as well as high blood triglycerides, your risk for heart disease is probably greater than if you only had high triglycerides. High blood cholesterol is one of the main risk factors for heart disease. CHANGING YOUR DIET  Your weight can affect your blood triglyceride level. If you are more than 20% above your ideal body weight, you may be able to lower your blood triglycerides by losing weight. Eating less and exercising regularly is the best way to combat this. Fat provides more  calories than any other food. The best way to lose weight is to eat less fat. Only 30% of your total calories should come from fat. Less than 7% of your diet should come from saturated fat. A diet low in fat and saturated fat is the same as a diet to decrease blood cholesterol. By eating a diet lower in fat, you may lose weight, lower your blood cholesterol, and lower your blood triglyceride level.  Eating a diet low in fat, especially saturated fat, may also help you lower your blood triglyceride level. Ask your dietitian to help you figure how much fat you can eat based on the number of calories your caregiver has prescribed for you.  Exercise, in addition to helping with weight loss may also help lower triglyceride levels.   Alcohol can increase blood triglycerides. You may need to stop drinking alcoholic beverages.  Too much carbohydrate in your diet may also increase your blood triglycerides. Some complex carbohydrates are necessary in your diet. These may include bread, rice, potatoes, other starchy vegetables and cereals.  Reduce "simple" carbohydrates. These may include pure sugars, candy, honey, and jelly without losing other nutrients. If you have the kind of high blood triglycerides that is affected by the amount of carbohydrates in your diet, you will need to eat less sugar and less high-sugar foods. Your caregiver can help you with this.  Adding 2-4 grams of fish oil (EPA+ DHA) may also help lower triglycerides. Speak with your caregiver before adding any supplements to your regimen. Following the Diet  Maintain your ideal weight. Your caregivers can help you with a diet. Generally, eating less food and getting more   exercise will help you lose weight. Joining a weight control group may also help. Ask your caregivers for a good weight control group in your area.  Eat low-fat foods instead of high-fat foods. This can help you lose weight too.  These foods are lower in fat. Eat MORE of these:    Dried beans, peas, and lentils.  Egg whites.  Low-fat cottage cheese.  Fish.  Lean cuts of meat, such as round, sirloin, rump, and flank (cut extra fat off meat you fix).  Whole grain breads, cereals and pasta.  Skim and nonfat dry milk.  Low-fat yogurt.  Poultry without the skin.  Cheese made with skim or part-skim milk, such as mozzarella, parmesan, farmers', ricotta, or pot cheese. These are higher fat foods. Eat LESS of these:   Whole milk and foods made from whole milk, such as American, blue, cheddar, monterey jack, and swiss cheese  High-fat meats, such as luncheon meats, sausages, knockwurst, bratwurst, hot dogs, ribs, corned beef, ground pork, and regular ground beef.  Fried foods. Limit saturated fats in your diet. Substituting unsaturated fat for saturated fat may decrease your blood triglyceride level. You will need to read package labels to know which products contain saturated fats.  These foods are high in saturated fat. Eat LESS of these:   Fried pork skins.  Whole milk.  Skin and fat from poultry.  Palm oil.  Butter.  Shortening.  Cream cheese.  Bacon.  Margarines and baked goods made from listed oils.  Vegetable shortenings.  Chitterlings.  Fat from meats.  Coconut oil.  Palm kernel oil.  Lard.  Cream.  Sour cream.  Fatback.  Coffee whiteners and non-dairy creamers made with these oils.  Cheese made from whole milk. Use unsaturated fats (both polyunsaturated and monounsaturated) moderately. Remember, even though unsaturated fats are better than saturated fats; you still want a diet low in total fat.  These foods are high in unsaturated fat:   Canola oil.  Sunflower oil.  Mayonnaise.  Almonds.  Peanuts.  Pine nuts.  Margarines made with these oils.  Safflower oil.  Olive oil.  Avocados.  Cashews.  Peanut butter.  Sunflower seeds.  Soybean oil.  Peanut  oil.  Olives.  Pecans.  Walnuts.  Pumpkin seeds. Avoid sugar and other high-sugar foods. This will decrease carbohydrates without decreasing other nutrients. Sugar in your food goes rapidly to your blood. When there is excess sugar in your blood, your liver may use it to make more triglycerides. Sugar also contains calories without other important nutrients.  Eat LESS of these:   Sugar, brown sugar, powdered sugar, jam, jelly, preserves, honey, syrup, molasses, pies, candy, cakes, cookies, frosting, pastries, colas, soft drinks, punches, fruit drinks, and regular gelatin.  Avoid alcohol. Alcohol, even more than sugar, may increase blood triglycerides. In addition, alcohol is high in calories and low in nutrients. Ask for sparkling water, or a diet soft drink instead of an alcoholic beverage. Suggestions for planning and preparing meals   Bake, broil, grill or roast meats instead of frying.  Remove fat from meats and skin from poultry before cooking.  Add spices, herbs, lemon juice or vinegar to vegetables instead of salt, rich sauces or gravies.  Use a non-stick skillet without fat or use no-stick sprays.  Cool and refrigerate stews and broth. Then remove the hardened fat floating on the surface before serving.  Refrigerate meat drippings and skim off fat to make low-fat gravies.  Serve more fish.  Use less butter,   margarine and other high-fat spreads on bread or vegetables.  Use skim or reconstituted non-fat dry milk for cooking.  Cook with low-fat cheeses.  Substitute low-fat yogurt or cottage cheese for all or part of the sour cream in recipes for sauces, dips or congealed salads.  Use half yogurt/half mayonnaise in salad recipes.  Substitute evaporated skim milk for cream. Evaporated skim milk or reconstituted non-fat dry milk can be whipped and substituted for whipped cream in certain recipes.  Choose fresh fruits for dessert instead of high-fat foods such as pies or  cakes. Fruits are naturally low in fat. When Dining Out   Order low-fat appetizers such as fruit or vegetable juice, pasta with vegetables or tomato sauce.  Select clear, rather than cream soups.  Ask that dressings and gravies be served on the side. Then use less of them.  Order foods that are baked, broiled, poached, steamed, stir-fried, or roasted.  Ask for margarine instead of butter, and use only a small amount.  Drink sparkling water, unsweetened tea or coffee, or diet soft drinks instead of alcohol or other sweet beverages. QUESTIONS AND ANSWERS ABOUT OTHER FATS IN THE BLOOD: SATURATED FAT, TRANS FAT, AND CHOLESTEROL What is trans fat? Trans fat is a type of fat that is formed when vegetable oil is hardened through a process called hydrogenation. This process helps makes foods more solid, gives them shape, and prolongs their shelf life. Trans fats are also called hydrogenated or partially hydrogenated oils.  What do saturated fat, trans fat, and cholesterol in foods have to do with heart disease? Saturated fat, trans fat, and cholesterol in the diet all raise the level of LDL "bad" cholesterol in the blood. The higher the LDL cholesterol, the greater the risk for coronary heart disease (CHD). Saturated fat and trans fat raise LDL similarly.  What foods contain saturated fat, trans fat, and cholesterol? High amounts of saturated fat are found in animal products, such as fatty cuts of meat, chicken skin, and full-fat dairy products like butter, whole milk, cream, and cheese, and in tropical vegetable oils such as palm, palm kernel, and coconut oil. Trans fat is found in some of the same foods as saturated fat, such as vegetable shortening, some margarines (especially hard or stick margarine), crackers, cookies, baked goods, fried foods, salad dressings, and other processed foods made with partially hydrogenated vegetable oils. Small amounts of trans fat also occur naturally in some animal  products, such as milk products, beef, and lamb. Foods high in cholesterol include liver, other organ meats, egg yolks, shrimp, and full-fat dairy products. How can I use the new food label to make heart-healthy food choices? Check the Nutrition Facts panel of the food label. Choose foods lower in saturated fat, trans fat, and cholesterol. For saturated fat and cholesterol, you can also use the Percent Daily Value (%DV): 5% DV or less is low, and 20% DV or more is high. (There is no %DV for trans fat.) Use the Nutrition Facts panel to choose foods low in saturated fat and cholesterol, and if the trans fat is not listed, read the ingredients and limit products that list shortening or hydrogenated or partially hydrogenated vegetable oil, which tend to be high in trans fat. POINTS TO REMEMBER:   Discuss your risk for heart disease with your caregivers, and take steps to reduce risk factors.  Change your diet. Choose foods that are low in saturated fat, trans fat, and cholesterol.  Add exercise to your daily routine if   it is not already being done. Participate in physical activity of moderate intensity, like brisk walking, for at least 30 minutes on most, and preferably all days of the week. No time? Break the 30 minutes into three, 10-minute segments during the day.  Stop smoking. If you do smoke, contact your caregiver to discuss ways in which they can help you quit.  Do not use street drugs.  Maintain a normal weight.  Maintain a healthy blood pressure.  Keep up with your blood work for checking the fats in your blood as directed by your caregiver. Document Released: 07/17/2004 Document Revised: 03/30/2012 Document Reviewed: 02/12/2009 [employer] Patient Information 2014 [employer].  

## 2013-09-16 NOTE — Progress Notes (Signed)
   Subjective:    Patient ID: Margaret Barker, female    DOB: [birth year], 42 y.o.   MRN: [id number]  HPI  This 42 y.o. female presents for evaluation of hot flashes.  She has been having  Hot flashes for 2 weeks now and she cannot sleep.  She has been having them During the day and she sweats and feels miserable. She does not smoke or use  Tobacco.  She has hx of partial hysterectomy 2006.  She still has her ovaries. She has been tobacco free for 2 months ago.  She has long hx of smoking cigarettes. She has hyperlipidemia and has recently had adjustment to her lipid medicine.  Review of Systems C/o hot flashes   No chest pain, SOB, HA, dizziness, vision change, N/V, diarrhea, constipation, dysuria, urinary urgency or frequency, myalgias, arthralgias or rash.  Objective:   Physical Exam Vital signs noted  Well developed well nourished female.  HEENT - Head atraumatic Normocephalic                Eyes - PERRLA, Conjuctiva - clear Sclera- Clear EOMI                Ears - EAC's Wnl TM's Wnl Gross Hearing WNL                Nose - Nares patent                 Throat - oropharanx wnl Respiratory - Lungs CTA bilateral Cardiac - RRR S1 and S2 without murmur GI - Abdomen soft Nontender and bowel sounds active x 4 Extremities - No edema. Neuro - Grossly intact.       Assessment & Plan:  Hot flashes - Plan: cloNIDine (CATAPRES) 0.1 MG tablet po qhs prn hot flashes. Recommend black cohosh or oil of primrose otc.  Recommend follow up if not better.  Other and unspecified hyperlipidemia - Plan: POCT CBC, CMP14+EGFR, Lipid panel  Deatra Canter FNP

## 2013-09-17 LAB — CMP14+EGFR
ALT: 32 IU/L (ref 0–32)
AST: 23 IU/L (ref 0–40)
Albumin/Globulin Ratio: 2.4 (ref 1.1–2.5)
Albumin: 5 g/dL (ref 3.5–5.5)
Alkaline Phosphatase: 63 IU/L (ref 39–117)
BUN/Creatinine Ratio: 18 (ref 9–23)
BUN: 16 mg/dL (ref 6–24)
CO2: 21 mmol/L (ref 18–29)
Calcium: 10.4 mg/dL — ABNORMAL HIGH (ref 8.7–10.2)
Chloride: 101 mmol/L (ref 97–108)
Creatinine, Ser: 0.89 mg/dL (ref 0.57–1.00)
GFR calc Af Amer: 92 mL/min/{1.73_m2} (ref >59)
GFR calc non Af Amer: 80 mL/min/{1.73_m2} (ref >59)
Globulin, Total: 2.1 g/dL (ref 1.5–4.5)
Glucose: 92 mg/dL (ref 65–99)
Potassium: 4.8 mmol/L (ref 3.5–5.2)
Sodium: 138 mmol/L (ref 134–144)
Total Bilirubin: 0.5 mg/dL (ref 0.0–1.2)
Total Protein: 7.1 g/dL (ref 6.0–8.5)

## 2013-09-17 LAB — LIPID PANEL
Chol/HDL Ratio: 2.3 ratio units (ref 0.0–4.4)
Cholesterol, Total: 171 mg/dL (ref 100–199)
HDL: 75 mg/dL (ref >39)
LDL Calculated: 79 mg/dL (ref 0–99)
Triglycerides: 83 mg/dL (ref 0–149)
VLDL Cholesterol Cal: 17 mg/dL (ref 5–40)

## 2013-09-19 ENCOUNTER — Other Ambulatory Visit: Payer: Self-pay | Admitting: Family Medicine

## 2013-09-19 MED ORDER — FOLIC ACID 1 MG PO TABS: 1.0000 mg | ORAL_TABLET | Freq: Every day | ORAL | Status: DC

## 2013-09-20 ENCOUNTER — Telehealth: Payer: Self-pay | Admitting: Family Medicine

## 2013-09-22 NOTE — Telephone Encounter (Signed)
Pt aware.

## 2013-10-13 DIAGNOSIS — R748 Abnormal levels of other serum enzymes: Secondary | ICD-10-CM

## 2013-10-13 HISTORY — DX: Abnormal levels of other serum enzymes: R74.8

## 2013-10-17 DIAGNOSIS — H00029 Hordeolum internum unspecified eye, unspecified eyelid: Secondary | ICD-10-CM | POA: Diagnosis not present

## 2013-10-24 DIAGNOSIS — F3289 Other specified depressive episodes: Secondary | ICD-10-CM | POA: Diagnosis not present

## 2013-10-24 DIAGNOSIS — F329 Major depressive disorder, single episode, unspecified: Secondary | ICD-10-CM | POA: Diagnosis not present

## 2013-11-01 ENCOUNTER — Other Ambulatory Visit: Payer: Self-pay | Admitting: Nurse Practitioner

## 2013-11-03 ENCOUNTER — Encounter: Payer: Self-pay | Admitting: Family Medicine

## 2013-11-03 ENCOUNTER — Ambulatory Visit (INDEPENDENT_AMBULATORY_CARE_PROVIDER_SITE_OTHER): Payer: Medicare Other | Admitting: Family Medicine

## 2013-11-03 VITALS — BP 96/64 | HR 75 | Temp 98.4°F | Ht 66.0 in | Wt 151.0 lb

## 2013-11-03 DIAGNOSIS — L259 Unspecified contact dermatitis, unspecified cause: Secondary | ICD-10-CM | POA: Diagnosis not present

## 2013-11-03 DIAGNOSIS — Z9109 Other allergy status, other than to drugs and biological substances: Secondary | ICD-10-CM | POA: Diagnosis not present

## 2013-11-03 DIAGNOSIS — L309 Dermatitis, unspecified: Secondary | ICD-10-CM

## 2013-11-03 MED ORDER — FLUTICASONE PROPIONATE 50 MCG/ACT NA SUSP: 2.0000 | Freq: Every day | NASAL | Status: DC

## 2013-11-03 MED ORDER — HYDROCORTISONE 1 % EX OINT: 1.0000 "application " | TOPICAL_OINTMENT | Freq: Two times a day (BID) | CUTANEOUS | Status: DC

## 2013-11-03 NOTE — Progress Notes (Signed)
   Subjective:    Patient ID: Margaret Barker, female    DOB: 1970-12-03, 42 y.o.   MRN: [id number]  HPI  This 43 y.o. female presents for evaluation of rash on lower face and seasonal allergies. She has not responded to the singulair and wants to try something else.  Review of Systems    No chest pain, SOB, HA, dizziness, vision change, N/V, diarrhea, constipation, dysuria, urinary urgency or frequency, myalgias, arthralgias or rash.  Objective:   Physical Exam  Vital signs noted  Well developed well nourished female.  HEENT - Head atraumatic Normocephalic                Eyes - PERRLA, Conjuctiva - clear Sclera- Clear EOMI                Ears - EAC's Wnl TM's Wnl Gross Hearing WNL                Nose - Nares patent  Respiratory - Lungs CTA bilateral Cardiac - RRR S1 and S2 without murmur GI - Abdomen soft Nontender and bowel sounds active x 4 Extremities - No edema. Neuro - Grossly intact. Skin - Dry raised erythematous rash     Assessment & Plan:  Environmental allergies - Plan: fluticasone (FLONASE) 50 MCG/ACT nasal spray  Eczema - Plan: hydrocortisone 1 % ointment in light coats bid  Lysbeth Penner FNP

## 2013-11-04 ENCOUNTER — Other Ambulatory Visit: Payer: Self-pay | Admitting: Family Medicine

## 2013-11-04 ENCOUNTER — Telehealth: Payer: Self-pay | Admitting: Family Medicine

## 2013-11-04 DIAGNOSIS — L309 Dermatitis, unspecified: Secondary | ICD-10-CM

## 2013-11-04 MED ORDER — HYDROCORTISONE 1 % EX OINT: 1.0000 "application " | TOPICAL_OINTMENT | Freq: Two times a day (BID) | CUTANEOUS | Status: DC

## 2013-11-04 NOTE — Telephone Encounter (Signed)
Please advise 

## 2013-11-04 NOTE — Telephone Encounter (Signed)
Cream was sent and I'll send it again

## 2013-11-04 NOTE — Telephone Encounter (Signed)
Pt notified that rx was sent in to [facility]

## 2013-12-02 DIAGNOSIS — R3129 Other microscopic hematuria: Secondary | ICD-10-CM | POA: Diagnosis not present

## 2013-12-02 DIAGNOSIS — N3946 Mixed incontinence: Secondary | ICD-10-CM | POA: Diagnosis not present

## 2013-12-06 ENCOUNTER — Other Ambulatory Visit: Payer: Self-pay | Admitting: Family Medicine

## 2013-12-22 DIAGNOSIS — H524 Presbyopia: Secondary | ICD-10-CM | POA: Diagnosis not present

## 2013-12-22 DIAGNOSIS — H1045 Other chronic allergic conjunctivitis: Secondary | ICD-10-CM | POA: Diagnosis not present

## 2013-12-22 DIAGNOSIS — H5200 Hypermetropia, unspecified eye: Secondary | ICD-10-CM | POA: Diagnosis not present

## 2013-12-26 ENCOUNTER — Ambulatory Visit: Payer: Medicare Other | Admitting: General Practice

## 2014-01-13 DIAGNOSIS — J329 Chronic sinusitis, unspecified: Secondary | ICD-10-CM | POA: Diagnosis not present

## 2014-01-16 DIAGNOSIS — F329 Major depressive disorder, single episode, unspecified: Secondary | ICD-10-CM | POA: Diagnosis not present

## 2014-01-16 DIAGNOSIS — F3289 Other specified depressive episodes: Secondary | ICD-10-CM | POA: Diagnosis not present

## 2014-01-19 ENCOUNTER — Telehealth: Payer: Self-pay | Admitting: General Practice

## 2014-01-19 NOTE — Telephone Encounter (Signed)
Appt given for tomorrow per patients request 

## 2014-01-19 NOTE — Telephone Encounter (Signed)
Spoke with pt and advised sjhe would need to see provider here first. appt sch

## 2014-01-20 ENCOUNTER — Encounter: Payer: Self-pay | Admitting: Nurse Practitioner

## 2014-01-20 ENCOUNTER — Ambulatory Visit (INDEPENDENT_AMBULATORY_CARE_PROVIDER_SITE_OTHER): Payer: Medicare Other | Admitting: Nurse Practitioner

## 2014-01-20 VITALS — BP 98/68 | HR 67 | Temp 98.0°F | Ht 66.0 in | Wt 157.2 lb

## 2014-01-20 DIAGNOSIS — N949 Unspecified condition associated with female genital organs and menstrual cycle: Secondary | ICD-10-CM | POA: Diagnosis not present

## 2014-01-20 DIAGNOSIS — R102 Pelvic and perineal pain: Secondary | ICD-10-CM

## 2014-01-20 LAB — POCT URINALYSIS DIPSTICK
BILIRUBIN UA: NEGATIVE
Glucose, UA: NEGATIVE
KETONES UA: NEGATIVE
Leukocytes, UA: NEGATIVE
NITRITE UA: NEGATIVE
PH UA: 7.5
PROTEIN UA: NEGATIVE
Spec Grav, UA: 1.02
Urobilinogen, UA: NEGATIVE

## 2014-01-20 LAB — POCT UA - MICROSCOPIC ONLY
Bacteria, U Microscopic: NEGATIVE
Casts, Ur, LPF, POC: NEGATIVE
Crystals, Ur, HPF, POC: NEGATIVE
Mucus, UA: NEGATIVE
WBC, UR, HPF, POC: NEGATIVE
Yeast, UA: NEGATIVE

## 2014-01-20 MED ORDER — NAPROXEN 500 MG PO TABS: 500.0000 mg | ORAL_TABLET | Freq: Two times a day (BID) | ORAL | Status: DC

## 2014-01-20 NOTE — Progress Notes (Signed)
   Subjective:    Patient ID: Margaret Barker, female    DOB: 1971-08-28, 43 y.o.   MRN: [id number]  HPI Patient in c/o pain after intercourse and right flank pain with strenous activity. SHe has a history of ovarian cysts. SHe has been having this pain X2 weeks- The pain usually last for a day or two when it starts - motrin helps but still takes awhile to go away. Hysterectomy in 2005 but left her ovaries.    Review of Systems  Constitutional: Negative.   HENT: Negative.   Respiratory: Negative.   Cardiovascular: Negative.   Gastrointestinal: Positive for abdominal pain.  Genitourinary: Positive for dysuria and frequency.  Psychiatric/Behavioral: Negative.   All other systems reviewed and are negative.      Objective:   Physical Exam  Constitutional: She is oriented to person, place, and time. She appears well-developed and well-nourished.  Cardiovascular: Normal rate, regular rhythm and normal heart sounds.   Pulmonary/Chest: Effort normal and breath sounds normal.  Genitourinary: Vagina normal.  Bladder sling palpable in anterior vaginal wall with bil pelvic pain on palpation- no adnexal masses palpable.  Neurological: She is alert and oriented to person, place, and time.   BP 98/68  Pulse 67  Temp(Src) 98 F (36.7 C) (Oral)  Ht $R'5\' 6"'LG$  (1.676 m)  Wt 157 lb 3.2 oz (71.305 kg)  BMI 25.38 kg/m2        Assessment & Plan:   1. Pelvic pain    Meds ordered this encounter  Medications  . DISCONTD: amoxicillin-clavulanate (AUGMENTIN) 875-125 MG per tablet    Sig:   . gabapentin (NEURONTIN) 300 MG capsule    Sig: Take 300 mg by mouth 3 (three) times daily.  . naproxen (NAPROSYN) 500 MG tablet    Sig: Take 1 tablet (500 mg total) by mouth 2 (two) times daily with a meal.    Dispense:  30 tablet    Refill:  0    Order Specific Question:  Supervising Provider    Answer:  Chipper Herb [[id number]]   Orders Placed This Encounter  Procedures  . US Pelvis Complete    Standing  Status: Future     Number of Occurrences:      Standing Expiration Date: 03/23/2015    Order Specific Question:  Reason for Exam (SYMPTOM  OR DIAGNOSIS REQUIRED)    Answer:  pelvic pain    Order Specific Question:  Preferred imaging location?    Answer:  [facility] POCT UA - Microscopic Only  . POCT urinalysis dipstick  . POCT CBC   Will talk once all results are back.   Mary-Margaret Hassell Done, FNP

## 2014-01-20 NOTE — Patient Instructions (Signed)
Pelvic Pain, Female °Female pelvic pain can be caused by many different things and start from a variety of places. Pelvic pain refers to pain that is located in the lower half of the abdomen and between your hips. The pain may occur over a short period of time (acute) or may be reoccurring (chronic). The cause of pelvic pain may be related to disorders affecting the female reproductive organs (gynecologic), but it may also be related to the bladder, kidney stones, an intestinal complication, or muscle or skeletal problems. Getting help right away for pelvic pain is important, especially if there has been severe, sharp, or a sudden onset of unusual pain. It is also important to get help right away because some types of pelvic pain can be life threatening.  °CAUSES  °Below are only some of the causes of pelvic pain. The causes of pelvic pain can be in one of several categories.  °· Gynecologic. °· Pelvic inflammatory disease. °· Sexually transmitted infection. °· Ovarian cyst or a twisted ovarian ligament (ovarian torsion). °· Uterine lining that grows outside the uterus (endometriosis). °· Fibroids, cysts, or tumors. °· Ovulation. °· Pregnancy. °· Pregnancy that occurs outside the uterus (ectopic pregnancy). °· Miscarriage. °· Labor. °· Abruption of the placenta or ruptured uterus. °· Infection. °· Uterine infection (endometritis). °· Bladder infection. °· Diverticulitis. °· Miscarriage related to a uterine infection (septic abortion). °· Bladder. °· Inflammation of the bladder (cystitis). °· Kidney stone(s). °· Gastrointenstinal. °· Constipation. °· Diverticulitis. °· Neurologic. °· Trauma. °· Feeling pelvic pain because of mental or emotional causes (psychosomatic). °· Cancers of the bowel or pelvis. °EVALUATION  °Your caregiver will want to take a careful history of your concerns. This includes recent changes in your health, a careful gynecologic history of your periods (menses), and a sexual history. Obtaining  your family history and medical history is also important. Your caregiver may suggest a pelvic exam. A pelvic exam will help identify the location and severity of the pain. It also helps in the evaluation of which organ system may be involved. In order to identify the cause of the pelvic pain and be properly treated, your caregiver may order tests. These tests may include:  °· A pregnancy test. °· Pelvic ultrasonography. °· An X-ray exam of the abdomen. °· A urinalysis or evaluation of vaginal discharge. °· Blood tests. °HOME CARE INSTRUCTIONS  °· Only take over-the-counter or prescription medicines for pain, discomfort, or fever as directed by your caregiver.   °· Rest as directed by your caregiver.   °· Eat a balanced diet.   °· Drink enough fluids to make your urine clear or pale yellow, or as directed.   °· Avoid sexual intercourse if it causes pain.   °· Apply warm or cold compresses to the lower abdomen depending on which one helps the pain.   °· Avoid stressful situations.   °· Keep a journal of your pelvic pain. Write down when it started, where the pain is located, and if there are things that seem to be associated with the pain, such as food or your menstrual cycle. °· Follow up with your caregiver as directed.   °SEEK MEDICAL CARE IF: °· Your medicine does not help your pain. °· You have abnormal vaginal discharge. °SEEK IMMEDIATE MEDICAL CARE IF:  °· You have heavy bleeding from the vagina.   °· Your pelvic pain increases.   °· You feel lightheaded or faint.   °· You have chills.   °· You have pain with urination or blood in your urine.   °· You have uncontrolled   diarrhea or vomiting.   °· You have a fever or persistent symptoms for more than 3 days. °· You have a fever and your symptoms suddenly get worse.   °· You are being physically or sexually abused.   °MAKE SURE YOU: °· Understand these instructions. °· Will watch your condition. °· Will get help if you are not doing well or get worse. °Document  Released: 08/26/2004 Document Revised: 03/30/2012 Document Reviewed: 01/19/2012 °[employer]® Patient Information ©2014 [employer]. ° °

## 2014-01-21 LAB — BMP8+EGFR
BUN/Creatinine Ratio: 11 (ref 9–23)
BUN: 10 mg/dL (ref 6–24)
CO2: 24 mmol/L (ref 18–29)
CREATININE: 0.87 mg/dL (ref 0.57–1.00)
Calcium: 9.8 mg/dL (ref 8.7–10.2)
Chloride: 99 mmol/L (ref 97–108)
GFR calc Af Amer: 95 mL/min/{1.73_m2} (ref >59)
GFR, EST NON AFRICAN AMERICAN: 82 mL/min/{1.73_m2} (ref >59)
Glucose: 66 mg/dL (ref 65–99)
POTASSIUM: 4.3 mmol/L (ref 3.5–5.2)
SODIUM: 140 mmol/L (ref 134–144)

## 2014-01-24 ENCOUNTER — Other Ambulatory Visit: Payer: Self-pay | Admitting: Nurse Practitioner

## 2014-01-24 DIAGNOSIS — R102 Pelvic and perineal pain: Secondary | ICD-10-CM

## 2014-01-26 ENCOUNTER — Ambulatory Visit (HOSPITAL_COMMUNITY): Payer: Medicare Other

## 2014-01-26 ENCOUNTER — Other Ambulatory Visit (HOSPITAL_COMMUNITY): Payer: Medicare Other

## 2014-01-26 ENCOUNTER — Ambulatory Visit (HOSPITAL_COMMUNITY)
Admission: RE | Admit: 2014-01-26 | Discharge: 2014-01-26 | Disposition: A | Discharge status: Home / Self Care | Payer: Medicare Other | Source: Ambulatory Visit | Attending: Nurse Practitioner | Admitting: Nurse Practitioner

## 2014-01-26 DIAGNOSIS — N949 Unspecified condition associated with female genital organs and menstrual cycle: Secondary | ICD-10-CM | POA: Insufficient documentation

## 2014-01-26 DIAGNOSIS — Z90710 Acquired absence of both cervix and uterus: Secondary | ICD-10-CM | POA: Insufficient documentation

## 2014-01-26 DIAGNOSIS — R102 Pelvic and perineal pain: Secondary | ICD-10-CM

## 2014-01-27 ENCOUNTER — Telehealth: Payer: Self-pay | Admitting: *Deleted

## 2014-01-27 DIAGNOSIS — R1900 Intra-abdominal and pelvic swelling, mass and lump, unspecified site: Secondary | ICD-10-CM

## 2014-01-27 NOTE — Telephone Encounter (Signed)
Message copied by Shelbie Ammons on Fri Jan 27, 2014 10:49 AM ------      Message from: Chevis Pretty      Created: Thu Jan 26, 2014  5:55 PM       Ovaries look fine- questionable cyst or bowel artifact- recommends CT if needed- if having pain daily then I think we should do CT? ------

## 2014-01-27 NOTE — Telephone Encounter (Signed)
Ct ordered

## 2014-01-27 NOTE — Telephone Encounter (Signed)
Patient still having pain and would like to have a CT.

## 2014-01-27 NOTE — Telephone Encounter (Signed)
Patient aware. She will call back if symptoms worsen.

## 2014-02-01 ENCOUNTER — Other Ambulatory Visit: Payer: Self-pay

## 2014-02-01 ENCOUNTER — Telehealth: Payer: Self-pay | Admitting: Nurse Practitioner

## 2014-02-01 DIAGNOSIS — R1900 Intra-abdominal and pelvic swelling, mass and lump, unspecified site: Secondary | ICD-10-CM

## 2014-02-03 ENCOUNTER — Other Ambulatory Visit: Payer: Self-pay | Admitting: Family Medicine

## 2014-02-03 ENCOUNTER — Ambulatory Visit (HOSPITAL_COMMUNITY)
Admission: RE | Admit: 2014-02-03 | Discharge: 2014-02-03 | Disposition: A | Discharge status: Home / Self Care | Payer: Medicare Other | Source: Ambulatory Visit | Attending: Family Medicine | Admitting: Family Medicine

## 2014-02-03 DIAGNOSIS — Z90710 Acquired absence of both cervix and uterus: Secondary | ICD-10-CM | POA: Diagnosis not present

## 2014-02-03 DIAGNOSIS — R918 Other nonspecific abnormal finding of lung field: Secondary | ICD-10-CM | POA: Diagnosis not present

## 2014-02-03 DIAGNOSIS — N949 Unspecified condition associated with female genital organs and menstrual cycle: Secondary | ICD-10-CM | POA: Insufficient documentation

## 2014-02-03 DIAGNOSIS — R1900 Intra-abdominal and pelvic swelling, mass and lump, unspecified site: Secondary | ICD-10-CM

## 2014-02-03 DIAGNOSIS — N83209 Unspecified ovarian cyst, unspecified side: Secondary | ICD-10-CM | POA: Insufficient documentation

## 2014-02-03 MED ORDER — IOHEXOL 300 MG/ML  SOLN
100.0000 mL | Freq: Once | INTRAMUSCULAR | Status: AC | PRN
  Administered 2014-02-03: 100 mL via INTRAVENOUS

## 2014-02-06 ENCOUNTER — Telehealth: Payer: Self-pay | Admitting: Nurse Practitioner

## 2014-02-06 NOTE — Telephone Encounter (Signed)
Need to go back to see urologist that did surgery

## 2014-02-06 NOTE — Telephone Encounter (Signed)
Can't do anything about liver cyst- that is not causing your pain though because that is in right upper quadrant of abdomen.

## 2014-02-06 NOTE — Telephone Encounter (Signed)
Patient aware.

## 2014-02-06 NOTE — Telephone Encounter (Signed)
Patient is still having sever pain on right side and it is now going across the left side.. What is the next step? She said when you where doing the pelvic you touched bladder sling and that's when she felt pain. She is very concerned about all this?m

## 2014-02-06 NOTE — Telephone Encounter (Signed)
Wants to know about cyst in liver what they are going to do about this?

## 2014-02-07 DIAGNOSIS — N949 Unspecified condition associated with female genital organs and menstrual cycle: Secondary | ICD-10-CM | POA: Diagnosis not present

## 2014-02-14 ENCOUNTER — Ambulatory Visit (HOSPITAL_COMMUNITY): Admission: RE | Admit: 2014-02-14 | Payer: Medicare Other | Source: Ambulatory Visit

## 2014-03-10 DIAGNOSIS — N949 Unspecified condition associated with female genital organs and menstrual cycle: Secondary | ICD-10-CM | POA: Diagnosis not present

## 2014-03-13 ENCOUNTER — Telehealth: Payer: Self-pay | Admitting: Family Medicine

## 2014-03-13 NOTE — Telephone Encounter (Signed)
appt given for tomorrow with bill 

## 2014-03-14 ENCOUNTER — Ambulatory Visit (INDEPENDENT_AMBULATORY_CARE_PROVIDER_SITE_OTHER): Payer: Medicare Other | Admitting: Family Medicine

## 2014-03-14 ENCOUNTER — Other Ambulatory Visit: Payer: Self-pay | Admitting: Urology

## 2014-03-14 ENCOUNTER — Encounter: Payer: Self-pay | Admitting: Family Medicine

## 2014-03-14 VITALS — BP 116/71 | HR 57 | Temp 97.8°F | Ht 66.0 in | Wt 150.6 lb

## 2014-03-14 DIAGNOSIS — K2940 Chronic atrophic gastritis without bleeding: Secondary | ICD-10-CM

## 2014-03-14 DIAGNOSIS — K2970 Gastritis, unspecified, without bleeding: Secondary | ICD-10-CM

## 2014-03-14 DIAGNOSIS — A048 Other specified bacterial intestinal infections: Secondary | ICD-10-CM | POA: Diagnosis not present

## 2014-03-14 DIAGNOSIS — F331 Major depressive disorder, recurrent, moderate: Secondary | ICD-10-CM | POA: Diagnosis not present

## 2014-03-14 DIAGNOSIS — R110 Nausea: Secondary | ICD-10-CM

## 2014-03-14 DIAGNOSIS — K219 Gastro-esophageal reflux disease without esophagitis: Secondary | ICD-10-CM

## 2014-03-14 DIAGNOSIS — B9681 Helicobacter pylori [H. pylori] as the cause of diseases classified elsewhere: Secondary | ICD-10-CM

## 2014-03-14 MED ORDER — ESOMEPRAZOLE MAGNESIUM 40 MG PO PACK: PACK | ORAL | Status: DC

## 2014-03-14 MED ORDER — AMOXICILLIN 500 MG PO CAPS: ORAL_CAPSULE | ORAL | Status: DC

## 2014-03-14 MED ORDER — CLARITHROMYCIN 500 MG PO TABS: 500.0000 mg | ORAL_TABLET | Freq: Two times a day (BID) | ORAL | Status: DC

## 2014-03-14 MED ORDER — PROMETHAZINE HCL 25 MG PO TABS: 25.0000 mg | ORAL_TABLET | Freq: Three times a day (TID) | ORAL | Status: DC | PRN

## 2014-03-14 NOTE — Progress Notes (Signed)
   Subjective:    Patient ID: Margaret Barker, female    DOB: [birth year], 43 y.o.   MRN: [id number]  HPI This 43 y.o. female presents for evaluation of abdominal pain, GERD sx's, and nausea.  She States this feels just like the time she was tx'd for bacteria in her stomach that caused severe GERD sx's.  She states that her omeprazole has stopped working.   Review of Systems C/o GERD sx's No chest pain, SOB, HA, dizziness, vision change, N/V, diarrhea, constipation, dysuria, urinary urgency or frequency, myalgias, arthralgias or rash.     Objective:   Physical Exam  Vital signs noted  Well developed well nourished female.  HEENT - Head atraumatic Normocephalic                Eyes - PERRLA, Conjuctiva - clear Sclera- Clear EOMI                Ears - EAC's Wnl TM's Wnl Gross Hearing WNL                Throat - oropharanx wnl Respiratory - Lungs CTA bilateral Cardiac - RRR S1 and S2 without murmur GI - Abdomen soft tender epigastric region and bowel sounds active x 4 Extremities - No edema. Neuro - Grossly intact.      Assessment & Plan:  GERD (gastroesophageal reflux disease) - Plan: clarithromycin (BIAXIN) 500 MG tablet, amoxicillin (AMOXIL) 500 MG capsule, esomeprazole (NEXIUM) 40 MG packet, promethazine (PHENERGAN) 25 MG tablet  Helicobacter pylori gastritis - Plan: clarithromycin (BIAXIN) 500 MG tablet, amoxicillin (AMOXIL) 500 MG capsule, esomeprazole (NEXIUM) 40 MG packet, promethazine (PHENERGAN) 25 MG tablet  Nausea alone - Plan: clarithromycin (BIAXIN) 500 MG tablet, amoxicillin (AMOXIL) 500 MG capsule, esomeprazole (NEXIUM) 40 MG packet, promethazine (PHENERGAN) 25 MG tablet  Lysbeth Penner FNP

## 2014-03-22 ENCOUNTER — Telehealth: Payer: Self-pay | Admitting: Family Medicine

## 2014-03-22 NOTE — Telephone Encounter (Signed)
Please relay message, script left on [facility] voice mail.

## 2014-04-10 ENCOUNTER — Ambulatory Visit (INDEPENDENT_AMBULATORY_CARE_PROVIDER_SITE_OTHER): Payer: Medicare Other | Admitting: Family Medicine

## 2014-04-10 ENCOUNTER — Encounter: Payer: Self-pay | Admitting: Family Medicine

## 2014-04-10 VITALS — BP 100/61 | HR 67 | Temp 97.8°F | Ht 66.0 in | Wt 142.6 lb

## 2014-04-10 DIAGNOSIS — A048 Other specified bacterial intestinal infections: Secondary | ICD-10-CM | POA: Diagnosis not present

## 2014-04-10 DIAGNOSIS — R110 Nausea: Secondary | ICD-10-CM

## 2014-04-10 DIAGNOSIS — K2940 Chronic atrophic gastritis without bleeding: Secondary | ICD-10-CM

## 2014-04-10 DIAGNOSIS — K2970 Gastritis, unspecified, without bleeding: Secondary | ICD-10-CM

## 2014-04-10 DIAGNOSIS — K2100 Gastro-esophageal reflux disease with esophagitis, without bleeding: Secondary | ICD-10-CM

## 2014-04-10 DIAGNOSIS — B9681 Helicobacter pylori [H. pylori] as the cause of diseases classified elsewhere: Secondary | ICD-10-CM

## 2014-04-10 MED ORDER — PROMETHAZINE HCL 25 MG PO TABS: 25.0000 mg | ORAL_TABLET | Freq: Three times a day (TID) | ORAL | Status: DC | PRN

## 2014-04-10 MED ORDER — ESOMEPRAZOLE MAGNESIUM 40 MG PO CPDR: 40.0000 mg | DELAYED_RELEASE_CAPSULE | Freq: Every day | ORAL | Status: DC

## 2014-04-10 NOTE — Progress Notes (Signed)
   Subjective:    Patient ID: Margaret Barker, female    DOB: [birth year], 43 y.o.   MRN: [id number]  HPI  This 43 y.o. female presents for evaluation of GERD sx's.  She has been having sx's similar to when she was tx'd for H-pylori.  She was tx'd with 2 weeks of abx's and nexium and the sx's are still there. She c/o nausea. Review of Systems C/o GERD sx's and nausea No chest pain, SOB, HA, dizziness, vision change, N/V, diarrhea, constipation, dysuria, urinary urgency or frequency, myalgias, arthralgias or rash.     Objective:   Physical Exam  Vital signs noted  Well developed well nourished female.  HEENT - Head atraumatic Normocephalic                Eyes - PERRLA, Conjuctiva - clear Sclera- Clear EOMI                Ears - EAC's Wnl TM's Wnl Gross Hearing WNL                Throat - oropharanx wnl Respiratory - Lungs CTA bilateral Cardiac - RRR S1 and S2 without murmur GI - Abdomen soft Nontender and bowel sounds active x 4     Assessment & Plan:  Gastroesophageal reflux disease with esophagitis - Plan: esomeprazole (NEXIUM) 40 MG capsule, Ambulatory referral to Gastroenterology, promethazine (PHENERGAN) 25 MG   Nausea alone - Plan: promethazine (PHENERGAN) 25 MG tablet  Lysbeth Penner FNP

## 2014-04-11 ENCOUNTER — Telehealth: Payer: Self-pay | Admitting: Gastroenterology

## 2014-04-11 NOTE — Telephone Encounter (Signed)
Left message for pt to call back.  Pt states she has lost 20lbs in one month. States she has nausea/vomiting, loose stools and gerd. Pt referred by primary. Pt scheduled to see Amy Esterwood PA 04/13/14@3pm . Pt aware of appt.

## 2014-04-13 ENCOUNTER — Other Ambulatory Visit (INDEPENDENT_AMBULATORY_CARE_PROVIDER_SITE_OTHER): Payer: Medicare Other

## 2014-04-13 ENCOUNTER — Encounter: Payer: Self-pay | Admitting: Physician Assistant

## 2014-04-13 ENCOUNTER — Ambulatory Visit (INDEPENDENT_AMBULATORY_CARE_PROVIDER_SITE_OTHER): Payer: Medicare Other | Admitting: Physician Assistant

## 2014-04-13 VITALS — BP 90/60 | HR 88 | Ht 63.75 in | Wt 143.2 lb

## 2014-04-13 DIAGNOSIS — B9681 Helicobacter pylori [H. pylori] as the cause of diseases classified elsewhere: Secondary | ICD-10-CM

## 2014-04-13 DIAGNOSIS — R110 Nausea: Secondary | ICD-10-CM

## 2014-04-13 DIAGNOSIS — K2100 Gastro-esophageal reflux disease with esophagitis, without bleeding: Secondary | ICD-10-CM | POA: Diagnosis not present

## 2014-04-13 DIAGNOSIS — A048 Other specified bacterial intestinal infections: Secondary | ICD-10-CM

## 2014-04-13 DIAGNOSIS — A088 Other specified intestinal infections: Secondary | ICD-10-CM

## 2014-04-13 DIAGNOSIS — K2970 Gastritis, unspecified, without bleeding: Secondary | ICD-10-CM

## 2014-04-13 DIAGNOSIS — K219 Gastro-esophageal reflux disease without esophagitis: Secondary | ICD-10-CM

## 2014-04-13 DIAGNOSIS — K2940 Chronic atrophic gastritis without bleeding: Secondary | ICD-10-CM

## 2014-04-13 DIAGNOSIS — A084 Viral intestinal infection, unspecified: Secondary | ICD-10-CM

## 2014-04-13 LAB — COMPREHENSIVE METABOLIC PANEL
ALT: 18 U/L (ref 0–35)
AST: 20 U/L (ref 0–37)
Albumin: 4.8 g/dL (ref 3.5–5.2)
Alkaline Phosphatase: 67 U/L (ref 39–117)
BILIRUBIN TOTAL: 0.7 mg/dL (ref 0.2–1.2)
BUN: 9 mg/dL (ref 6–23)
CO2: 26 meq/L (ref 19–32)
Calcium: 9.9 mg/dL (ref 8.4–10.5)
Chloride: 102 mEq/L (ref 96–112)
Creatinine, Ser: 1 mg/dL (ref 0.4–1.2)
GFR: 65.19 mL/min (ref >60.00)
Glucose, Bld: 95 mg/dL (ref 70–99)
Potassium: 4.2 mEq/L (ref 3.5–5.1)
SODIUM: 136 meq/L (ref 135–145)
TOTAL PROTEIN: 8.1 g/dL (ref 6.0–8.3)

## 2014-04-13 LAB — CBC WITH DIFFERENTIAL/PLATELET
BASOS ABS: 0.1 10*3/uL (ref 0.0–0.1)
Basophils Relative: 0.3 % (ref 0.0–3.0)
Eosinophils Absolute: 0.1 10*3/uL (ref 0.0–0.7)
Eosinophils Relative: 0.3 % (ref 0.0–5.0)
HEMATOCRIT: 46.2 % — AB (ref 36.0–46.0)
Hemoglobin: 15.5 g/dL — ABNORMAL HIGH (ref 12.0–15.0)
LYMPHS ABS: 3.2 10*3/uL (ref 0.7–4.0)
Lymphocytes Relative: 14.4 % (ref 12.0–46.0)
MCHC: 33.6 g/dL (ref 30.0–36.0)
MCV: 100 fl (ref 78.0–100.0)
MONOS PCT: 3.2 % (ref 3.0–12.0)
Monocytes Absolute: 0.7 10*3/uL (ref 0.1–1.0)
NEUTROS PCT: 81.8 % — AB (ref 43.0–77.0)
Neutro Abs: 18.1 10*3/uL — ABNORMAL HIGH (ref 1.4–7.7)
PLATELETS: 270 10*3/uL (ref 150.0–400.0)
RBC: 4.61 Mil/uL (ref 3.87–5.11)
RDW: 15.2 % (ref 11.5–15.5)
WBC: 22.2 10*3/uL (ref 4.0–10.5)

## 2014-04-13 MED ORDER — PROMETHAZINE HCL 25 MG PO TABS: 25.0000 mg | ORAL_TABLET | Freq: Three times a day (TID) | ORAL | Status: DC | PRN

## 2014-04-13 MED ORDER — ESOMEPRAZOLE MAGNESIUM 40 MG PO CPDR: 40.0000 mg | DELAYED_RELEASE_CAPSULE | Freq: Every day | ORAL | Status: DC

## 2014-04-13 NOTE — Progress Notes (Signed)
Subjective:    Patient ID: Margaret Barker, female    DOB: [birth year], 43 y.o.   MRN: [id number]  HPI   [name] is a pleasant 43 year old white female known to Dr. Deatra Ina who had been seen in 2012 for colonoscopy which was normal and also had EGD at that time which is normal. She has history of GERD, depression with panic attacks and hyperlipidemia. She is status post cholecystectomy. Patient had undergone CT scan of the abdomen and pelvis in April of 2015 apparently because of complaints of pelvic pain and this was a negative exam. She did have 2 less than 4 mm subpleural nodules noted and was suggested she have a followup CT of the chest in 12 months.  Patient had been seen by her PCP and early June of 2015 with complaints of abdominal pain GERD symptoms and nausea. She had related that she felt a slight she did when she had H. Pylori. She did not have any repeat testing done but was treated with a course of Biaxin and Amoxil for H. pylori gastritis was also placed on Nexium. She was seen back in her office earlier this week with continued complaints of nausea and abdominal discomfort and had also had a weight loss of apparently 18 pounds altogether. She was placed tach on Nexium 40 mg by mouth once daily and given a prescription for Phenergan 25 mg every 6 hours to use for nausea and referred here.   She says now over the past week since being back on Nexium and using the and time she's feeling significantly better. Her appetite has improved and she's been eating a lot more over the past few days. She still having loose stools but is only having one bowel movement per day generally this is nonbloody. No fever or chills. No dysphagia or odynophagia and currently has no complaints of abdominal pain.    Review of Systems  Constitutional: Positive for unexpected weight change.  HENT: Negative.   Eyes: Negative.   Respiratory: Negative.   Cardiovascular: Negative.   Gastrointestinal: Positive for  nausea, vomiting, abdominal pain and diarrhea.  Endocrine: Negative.   Genitourinary: Negative.   Musculoskeletal: Negative.   Skin: Negative.   Allergic/Immunologic: Negative.   Neurological: Negative.   Hematological: Negative.   Psychiatric/Behavioral: Negative.    Outpatient Prescriptions Prior to Visit  Medication Sig Dispense Refill  . ALPRAZolam (XANAX) 0.5 MG tablet Take 0.5 mg by mouth 3 (three) times daily as needed. 1 tab by mouth three times a day as needed anxiety      . atorvastatin (LIPITOR) 80 MG tablet Take 1 tablet (80 mg total) by mouth daily.  90 tablet  3  . escitalopram (LEXAPRO) 20 MG tablet Take 20 mg by mouth daily.       . solifenacin (VESICARE) 10 MG tablet Take 10 mg by mouth daily.      . traZODone (DESYREL) 100 MG tablet Take 100 mg by mouth at bedtime.      . valACYclovir (VALTREX) 1000 MG tablet TAKE ONE TABLET BY MOUTH TWICE DAILY  60 tablet  3  . esomeprazole (NEXIUM) 40 MG capsule Take 1 capsule (40 mg total) by mouth daily.  30 capsule  3  . promethazine (PHENERGAN) 25 MG tablet Take 1 tablet (25 mg total) by mouth every 8 (eight) hours as needed for nausea or vomiting.  20 tablet  0   Facility-Administered Medications Prior to Visit  Medication Dose Route Frequency Provider Last Rate Last  Dose  . bupivacaine-EPINEPHrine (MARCAINE W/ EPI) 0.25 % (with pres) injection 20 mL  20 mL Infiltration Once Ailene Rud, MD       Allergies  Allergen Reactions  . Enablex [Darifenacin Hydrobromide Er]   . Neurontin [Gabapentin]    Patient Active Problem List   Diagnosis Date Noted  . ABDOMINAL PAIN, GENERALIZED 11/18/2010  . [place], [place] 04/19/2009  . MIXED INCONTINENCE URGE AND STRESS 03/21/2009  . DEPRESSION, MAJOR, RECURRENT 12/10/2006  . PANIC ATTACKS 12/10/2006  . POST TRAUMATIC STRESS DISORDER 12/10/2006  . GASTROESOPHAGEAL REFLUX, NO ESOPHAGITIS 12/10/2006   History  Substance Use Topics  . Smoking status: Current Every  Day Smoker -- 0.50 packs/day for 24 years    Types: Cigarettes  . Smokeless tobacco: Never Used  . Alcohol Use: Yes     Comment: OCCASIONAL (ONCE PER MONTH)   family history includes COPD in her father; Cervical cancer in her paternal grandmother; Colon cancer in her paternal uncle; Diabetes in her mother; Heart disease in her father; Hypertension in her father; Liver cancer in her paternal uncle.     Objective:   Physical Exam  old white female in no acute distress, blood pressure 90/60 pulse 88 height 5 foot 3 weight 143. HEENT; nontraumatic normocephalic EOMI PERRLA sclera anicteric, Supple ;no JVD, Cardiovascular regular rate and rhythm with S1-S2 no murmur or gallop, Pulmonary; clear bilaterally, Abdomen; is soft bowel sounds are present shows mild tenderness bilaterally nonfocal no guarding or rebound no palpable mass or hepatosplenomegaly, Rectal; exam not done, Extremities ;no clubbing cyanosis or edema skin warm and dry, Psych; mood and affect appropriate        Assessment & Plan:  #65  43 year old female with 4-5 week history of nausea abdominal pain and mild diarrhea associated with weight loss. She was treated empirically for H. pylori by her PCP and symptoms persisted  Over the past one week she has improved having minimal diarrhea no further abdominal pain and reflux symptoms have improved  Etiology is unclear at this time, she may have had an infectious gastroenteritis. If symptoms are persisting we will need to consider IBD or microscopic colitis   #2 history of anxiety and panic attacks  #3 GERD  Plan; check CBC with differential and CMET Increase Nexium to 40 mg by mouth twice daily x2 weeks then if improved continue once daily long-term Refill Phenergan 25 mg every 6 hours when necessary for nausea Further plans pending results of labs

## 2014-04-13 NOTE — Patient Instructions (Addendum)
You have been given a separate informational sheet regarding your tobacco use, the importance of quitting and local resources to help you quit. Please go to the basement level to have your labs drawn.   We sent refills for the Nexium 40 mg and phenergan 25 mg to [facility].  On the Nexium 40 mg, take 1 cap twice daily for 14 days then go to once daily.

## 2014-04-17 ENCOUNTER — Telehealth: Payer: Self-pay | Admitting: Physician Assistant

## 2014-04-17 NOTE — Progress Notes (Signed)
Reviewed and agree with management. Edge Mauger D. Vonette Grosso, M.D., FACG  

## 2014-04-17 NOTE — Telephone Encounter (Signed)
See results note. 

## 2014-04-18 ENCOUNTER — Other Ambulatory Visit: Payer: Self-pay | Admitting: *Deleted

## 2014-04-18 ENCOUNTER — Encounter (HOSPITAL_BASED_OUTPATIENT_CLINIC_OR_DEPARTMENT_OTHER): Payer: Self-pay | Admitting: *Deleted

## 2014-04-18 DIAGNOSIS — D72829 Elevated white blood cell count, unspecified: Secondary | ICD-10-CM

## 2014-04-19 ENCOUNTER — Telehealth: Payer: Self-pay | Admitting: Physician Assistant

## 2014-04-19 ENCOUNTER — Encounter (HOSPITAL_BASED_OUTPATIENT_CLINIC_OR_DEPARTMENT_OTHER): Payer: Self-pay | Admitting: *Deleted

## 2014-04-19 ENCOUNTER — Other Ambulatory Visit (INDEPENDENT_AMBULATORY_CARE_PROVIDER_SITE_OTHER): Payer: Medicare Other

## 2014-04-19 DIAGNOSIS — D72829 Elevated white blood cell count, unspecified: Secondary | ICD-10-CM

## 2014-04-19 LAB — CBC WITH DIFFERENTIAL/PLATELET
BASOS ABS: 0 10*3/uL (ref 0.0–0.1)
Basophils Relative: 0.4 % (ref 0.0–3.0)
Eosinophils Absolute: 0.2 10*3/uL (ref 0.0–0.7)
Eosinophils Relative: 1.3 % (ref 0.0–5.0)
HEMATOCRIT: 45.8 % (ref 36.0–46.0)
Hemoglobin: 15.7 g/dL — ABNORMAL HIGH (ref 12.0–15.0)
LYMPHS ABS: 3 10*3/uL (ref 0.7–4.0)
Lymphocytes Relative: 26.6 % (ref 12.0–46.0)
MCHC: 34.2 g/dL (ref 30.0–36.0)
MCV: 99.6 fl (ref 78.0–100.0)
MONO ABS: 0.5 10*3/uL (ref 0.1–1.0)
MONOS PCT: 4.7 % (ref 3.0–12.0)
Neutro Abs: 7.6 10*3/uL (ref 1.4–7.7)
Neutrophils Relative %: 67 % (ref 43.0–77.0)
PLATELETS: 280 10*3/uL (ref 150.0–400.0)
RBC: 4.6 Mil/uL (ref 3.87–5.11)
RDW: 15.7 % — ABNORMAL HIGH (ref 11.5–15.5)
WBC: 11.3 10*3/uL — ABNORMAL HIGH (ref 4.0–10.5)

## 2014-04-19 NOTE — Telephone Encounter (Signed)
Patient came for repeat lab work this AM. She is calling to report "bowels are loose again and I have no appetite." Please, advise.

## 2014-04-19 NOTE — Telephone Encounter (Signed)
Spoke with patient and gave her recommendations. Scheduled OV on 06/30/14 at 9:15 AM with Dr. Deatra Ina.

## 2014-04-19 NOTE — Telephone Encounter (Signed)
Lets add a probiotic daily like Align or Restora, and ask her to take bentyl 10 mg every morning for  IBS diarrhea . Needs a follow up with Dr. Deatra Ina also

## 2014-04-19 NOTE — Progress Notes (Signed)
NPO AFTER MN. ARRIVE AT 0715. CURRENT LAB RESULTS IN CHART AND EPIC. WILL TAKE AM MEDS W/ SIPS OF WATER.  REVIEWED [facility] GUIDELINES, WILL BRING MEDS.

## 2014-04-20 DIAGNOSIS — F331 Major depressive disorder, recurrent, moderate: Secondary | ICD-10-CM | POA: Diagnosis not present

## 2014-04-24 ENCOUNTER — Encounter (HOSPITAL_BASED_OUTPATIENT_CLINIC_OR_DEPARTMENT_OTHER): Payer: Self-pay

## 2014-04-24 ENCOUNTER — Encounter (HOSPITAL_BASED_OUTPATIENT_CLINIC_OR_DEPARTMENT_OTHER)
Admission: RE | Disposition: A | Discharge status: Home / Self Care | Payer: Self-pay | Source: Ambulatory Visit | Attending: Urology

## 2014-04-24 ENCOUNTER — Ambulatory Visit (HOSPITAL_BASED_OUTPATIENT_CLINIC_OR_DEPARTMENT_OTHER): Payer: Medicare Other | Admitting: Anesthesiology

## 2014-04-24 ENCOUNTER — Ambulatory Visit (HOSPITAL_BASED_OUTPATIENT_CLINIC_OR_DEPARTMENT_OTHER)
Admission: RE | Admit: 2014-04-24 | Discharge: 2014-04-25 | Disposition: A | Discharge status: Home / Self Care | Payer: Medicare Other | Source: Ambulatory Visit | Attending: Urology | Admitting: Urology

## 2014-04-24 ENCOUNTER — Encounter (HOSPITAL_BASED_OUTPATIENT_CLINIC_OR_DEPARTMENT_OTHER): Payer: Medicare Other | Admitting: Anesthesiology

## 2014-04-24 DIAGNOSIS — N8111 Cystocele, midline: Secondary | ICD-10-CM | POA: Diagnosis present

## 2014-04-24 DIAGNOSIS — T83721A Exposure of implanted vaginal mesh and other prosthetic materials into vagina, initial encounter: Secondary | ICD-10-CM | POA: Diagnosis not present

## 2014-04-24 DIAGNOSIS — K219 Gastro-esophageal reflux disease without esophagitis: Secondary | ICD-10-CM | POA: Insufficient documentation

## 2014-04-24 DIAGNOSIS — Z87891 Personal history of nicotine dependence: Secondary | ICD-10-CM | POA: Diagnosis not present

## 2014-04-24 DIAGNOSIS — IMO0002 Reserved for concepts with insufficient information to code with codable children: Secondary | ICD-10-CM | POA: Insufficient documentation

## 2014-04-24 DIAGNOSIS — F319 Bipolar disorder, unspecified: Secondary | ICD-10-CM | POA: Diagnosis not present

## 2014-04-24 DIAGNOSIS — N842 Polyp of vagina: Secondary | ICD-10-CM | POA: Diagnosis not present

## 2014-04-24 DIAGNOSIS — Z79899 Other long term (current) drug therapy: Secondary | ICD-10-CM | POA: Insufficient documentation

## 2014-04-24 DIAGNOSIS — T83729A Exposure of other prosthetic materials into organ or tissue, initial encounter: Secondary | ICD-10-CM | POA: Diagnosis not present

## 2014-04-24 DIAGNOSIS — T8389XA Other specified complication of genitourinary prosthetic devices, implants and grafts, initial encounter: Secondary | ICD-10-CM | POA: Diagnosis not present

## 2014-04-24 DIAGNOSIS — E7800 Pure hypercholesterolemia, unspecified: Secondary | ICD-10-CM | POA: Insufficient documentation

## 2014-04-24 DIAGNOSIS — F411 Generalized anxiety disorder: Secondary | ICD-10-CM | POA: Insufficient documentation

## 2014-04-24 DIAGNOSIS — T83711A Erosion of implanted vaginal mesh and other prosthetic materials to surrounding organ or tissue, initial encounter: Secondary | ICD-10-CM | POA: Diagnosis not present

## 2014-04-24 DIAGNOSIS — N949 Unspecified condition associated with female genital organs and menstrual cycle: Secondary | ICD-10-CM | POA: Insufficient documentation

## 2014-04-24 DIAGNOSIS — T85698A Other mechanical complication of other specified internal prosthetic devices, implants and grafts, initial encounter: Secondary | ICD-10-CM | POA: Diagnosis not present

## 2014-04-24 HISTORY — PX: CYSTOSCOPY: SHX5120

## 2014-04-24 HISTORY — DX: Hyperlipidemia, unspecified: E78.5

## 2014-04-24 HISTORY — DX: Helicobacter pylori (H. pylori) as the cause of diseases classified elsewhere: K29.70

## 2014-04-24 HISTORY — DX: Helicobacter pylori (H. pylori) as the cause of diseases classified elsewhere: B96.81

## 2014-04-24 HISTORY — DX: Personal history of other mental and behavioral disorders: Z86.59

## 2014-04-24 HISTORY — DX: Esophagitis, unspecified: K20.9

## 2014-04-24 HISTORY — DX: Erosion of implanted vaginal mesh to surrounding organ or tissue, initial encounter: T83.711A

## 2014-04-24 HISTORY — PX: PUBOVAGINAL SLING: SHX1035

## 2014-04-24 HISTORY — DX: Esophagitis, unspecified without bleeding: K20.90

## 2014-04-24 HISTORY — DX: Presence of spectacles and contact lenses: Z97.3

## 2014-04-24 SURGERY — CREATION, PUBOVAGINAL SLING
Anesthesia: General | Site: Vagina

## 2014-04-24 MED ORDER — CIPROFLOXACIN HCL 500 MG PO TABS
ORAL_TABLET | ORAL | Status: AC
  Filled 2014-04-24: qty 1

## 2014-04-24 MED ORDER — CIPROFLOXACIN HCL 500 MG PO TABS
500.0000 mg | ORAL_TABLET | Freq: Two times a day (BID) | ORAL | Status: DC
  Administered 2014-04-24: 500 mg via ORAL
  Filled 2014-04-24: qty 1

## 2014-04-24 MED ORDER — LIDOCAINE HCL (CARDIAC) 20 MG/ML IV SOLN
INTRAVENOUS | Status: DC | PRN
  Administered 2014-04-24: 60 mg via INTRAVENOUS

## 2014-04-24 MED ORDER — PROMETHAZINE HCL 25 MG PO TABS
25.0000 mg | ORAL_TABLET | Freq: Three times a day (TID) | ORAL | Status: DC | PRN
  Filled 2014-04-24: qty 1

## 2014-04-24 MED ORDER — ATORVASTATIN CALCIUM 80 MG PO TABS
80.0000 mg | ORAL_TABLET | Freq: Every morning | ORAL | Status: DC
  Filled 2014-04-24: qty 1

## 2014-04-24 MED ORDER — ONDANSETRON HCL 4 MG/2ML IJ SOLN
INTRAMUSCULAR | Status: AC
  Filled 2014-04-24: qty 2

## 2014-04-24 MED ORDER — SENNOSIDES-DOCUSATE SODIUM 8.6-50 MG PO TABS
2.0000 | ORAL_TABLET | Freq: Every day | ORAL | Status: DC
  Administered 2014-04-24: 2 via ORAL
  Filled 2014-04-24: qty 2

## 2014-04-24 MED ORDER — DEXTROSE-NACL 5-0.45 % IV SOLN
INTRAVENOUS | Status: DC
  Filled 2014-04-24: qty 1000

## 2014-04-24 MED ORDER — PROMETHAZINE HCL 25 MG/ML IJ SOLN
6.2500 mg | INTRAMUSCULAR | Status: DC | PRN
  Filled 2014-04-24: qty 1

## 2014-04-24 MED ORDER — BELLADONNA ALKALOIDS-OPIUM 16.2-60 MG RE SUPP
RECTAL | Status: DC | PRN
  Administered 2014-04-24: 1 via RECTAL

## 2014-04-24 MED ORDER — FENTANYL CITRATE 0.05 MG/ML IJ SOLN
INTRAMUSCULAR | Status: AC
  Filled 2014-04-24: qty 2

## 2014-04-24 MED ORDER — MIDAZOLAM HCL 2 MG/2ML IJ SOLN
INTRAMUSCULAR | Status: AC
  Filled 2014-04-24: qty 2

## 2014-04-24 MED ORDER — ACETAMINOPHEN 325 MG PO TABS
650.0000 mg | ORAL_TABLET | ORAL | Status: DC | PRN
  Filled 2014-04-24: qty 2

## 2014-04-24 MED ORDER — ALPRAZOLAM 0.5 MG PO TABS
0.5000 mg | ORAL_TABLET | Freq: Three times a day (TID) | ORAL | Status: DC | PRN
  Filled 2014-04-24: qty 1

## 2014-04-24 MED ORDER — DIPHENHYDRAMINE HCL 50 MG/ML IJ SOLN
12.5000 mg | Freq: Four times a day (QID) | INTRAMUSCULAR | Status: DC | PRN
  Filled 2014-04-24: qty 0.5

## 2014-04-24 MED ORDER — DIPHENHYDRAMINE HCL 12.5 MG/5ML PO ELIX
12.5000 mg | ORAL_SOLUTION | Freq: Four times a day (QID) | ORAL | Status: DC | PRN
  Filled 2014-04-24: qty 10

## 2014-04-24 MED ORDER — MEPERIDINE HCL 25 MG/ML IJ SOLN
6.2500 mg | INTRAMUSCULAR | Status: DC | PRN
  Filled 2014-04-24: qty 1

## 2014-04-24 MED ORDER — ONDANSETRON HCL 4 MG/2ML IJ SOLN
4.0000 mg | INTRAMUSCULAR | Status: DC | PRN
Start: 2014-04-24 — End: 2014-04-25
  Administered 2014-04-24: 4 mg via INTRAVENOUS
  Filled 2014-04-24: qty 2

## 2014-04-24 MED ORDER — POLYMYXIN B SULFATE 500000 UNITS IJ SOLR
INTRAMUSCULAR | Status: DC | PRN
  Administered 2014-04-24: 09:00:00

## 2014-04-24 MED ORDER — ACETAMINOPHEN 10 MG/ML IV SOLN
INTRAVENOUS | Status: DC | PRN
  Administered 2014-04-24: 1000 mg via INTRAVENOUS

## 2014-04-24 MED ORDER — TRAZODONE HCL 100 MG PO TABS
100.0000 mg | ORAL_TABLET | Freq: Every day | ORAL | Status: DC
  Filled 2014-04-24: qty 1

## 2014-04-24 MED ORDER — MIDAZOLAM HCL 5 MG/5ML IJ SOLN
INTRAMUSCULAR | Status: DC | PRN
  Administered 2014-04-24: 2 mg via INTRAVENOUS

## 2014-04-24 MED ORDER — FENTANYL CITRATE 0.05 MG/ML IJ SOLN
25.0000 ug | INTRAMUSCULAR | Status: DC | PRN
  Administered 2014-04-24 (×2): 50 ug via INTRAVENOUS
  Filled 2014-04-24: qty 1

## 2014-04-24 MED ORDER — STERILE WATER FOR IRRIGATION IR SOLN
Status: DC | PRN
  Administered 2014-04-24: 3000 mL

## 2014-04-24 MED ORDER — FENTANYL CITRATE 0.05 MG/ML IJ SOLN
INTRAMUSCULAR | Status: DC | PRN
  Administered 2014-04-24 (×4): 50 ug via INTRAVENOUS

## 2014-04-24 MED ORDER — ONDANSETRON HCL 4 MG/2ML IJ SOLN
INTRAMUSCULAR | Status: DC | PRN
  Administered 2014-04-24: 4 mg via INTRAVENOUS

## 2014-04-24 MED ORDER — CEFAZOLIN SODIUM-DEXTROSE 2-3 GM-% IV SOLR
2.0000 g | INTRAVENOUS | Status: AC
  Administered 2014-04-24: 2 g via INTRAVENOUS
  Filled 2014-04-24: qty 50

## 2014-04-24 MED ORDER — METRONIDAZOLE 0.75 % VA GEL
VAGINAL | Status: DC | PRN
  Administered 2014-04-24: 1 via VAGINAL

## 2014-04-24 MED ORDER — ZOLPIDEM TARTRATE 5 MG PO TABS
ORAL_TABLET | ORAL | Status: AC
  Filled 2014-04-24: qty 1

## 2014-04-24 MED ORDER — OXYCODONE-ACETAMINOPHEN 5-325 MG PO TABS
ORAL_TABLET | ORAL | Status: AC
  Filled 2014-04-24: qty 2

## 2014-04-24 MED ORDER — BUPIVACAINE-EPINEPHRINE 0.5% -1:200000 IJ SOLN
INTRAMUSCULAR | Status: DC | PRN
  Administered 2014-04-24: 30 mL

## 2014-04-24 MED ORDER — PANTOPRAZOLE SODIUM 40 MG PO TBEC
40.0000 mg | DELAYED_RELEASE_TABLET | Freq: Every day | ORAL | Status: DC
  Filled 2014-04-24: qty 1

## 2014-04-24 MED ORDER — ZOLPIDEM TARTRATE 5 MG PO TABS
5.0000 mg | ORAL_TABLET | Freq: Every evening | ORAL | Status: DC | PRN
  Administered 2014-04-24: 5 mg via ORAL
  Filled 2014-04-24: qty 1

## 2014-04-24 MED ORDER — ESCITALOPRAM OXALATE 20 MG PO TABS
20.0000 mg | ORAL_TABLET | Freq: Every morning | ORAL | Status: DC
  Filled 2014-04-24: qty 1

## 2014-04-24 MED ORDER — LACTATED RINGERS IV SOLN
INTRAVENOUS | Status: DC
  Filled 2014-04-24: qty 1000

## 2014-04-24 MED ORDER — CEFAZOLIN SODIUM-DEXTROSE 2-3 GM-% IV SOLR
INTRAVENOUS | Status: AC
  Filled 2014-04-24: qty 50

## 2014-04-24 MED ORDER — OXYBUTYNIN CHLORIDE 5 MG PO TABS
5.0000 mg | ORAL_TABLET | Freq: Three times a day (TID) | ORAL | Status: DC | PRN
  Filled 2014-04-24: qty 1

## 2014-04-24 MED ORDER — DEXAMETHASONE SODIUM PHOSPHATE 4 MG/ML IJ SOLN
INTRAMUSCULAR | Status: DC | PRN
  Administered 2014-04-24: 10 mg via INTRAVENOUS

## 2014-04-24 MED ORDER — SODIUM CHLORIDE 0.9 % IJ SOLN
INTRAMUSCULAR | Status: DC | PRN
  Administered 2014-04-24: 50 mL via INTRAVENOUS

## 2014-04-24 MED ORDER — PROPOFOL 10 MG/ML IV BOLUS
INTRAVENOUS | Status: DC | PRN
  Administered 2014-04-24: 50 mg via INTRAVENOUS
  Administered 2014-04-24: 200 mg via INTRAVENOUS

## 2014-04-24 MED ORDER — OXYCODONE-ACETAMINOPHEN 5-325 MG PO TABS
ORAL_TABLET | ORAL | Status: AC
  Filled 2014-04-24: qty 1

## 2014-04-24 MED ORDER — VALACYCLOVIR HCL 500 MG PO TABS
500.0000 mg | ORAL_TABLET | Freq: Every day | ORAL | Status: DC
  Filled 2014-04-24: qty 1

## 2014-04-24 MED ORDER — HYDROMORPHONE HCL PF 1 MG/ML IJ SOLN
0.5000 mg | INTRAMUSCULAR | Status: DC | PRN
  Administered 2014-04-24: 1 mg via INTRAVENOUS
  Filled 2014-04-24: qty 1

## 2014-04-24 MED ORDER — BACITRACIN-NEOMYCIN-POLYMYXIN 400-5-5000 EX OINT
1.0000 "application " | TOPICAL_OINTMENT | Freq: Three times a day (TID) | CUTANEOUS | Status: DC | PRN
  Filled 2014-04-24: qty 1

## 2014-04-24 MED ORDER — LACTATED RINGERS IV SOLN
INTRAVENOUS | Status: DC
  Administered 2014-04-24 (×3): via INTRAVENOUS
  Filled 2014-04-24: qty 1000

## 2014-04-24 MED ORDER — BELLADONNA ALKALOIDS-OPIUM 16.2-60 MG RE SUPP
RECTAL | Status: AC
  Filled 2014-04-24: qty 1

## 2014-04-24 MED ORDER — OXYCODONE-ACETAMINOPHEN 5-325 MG PO TABS
1.0000 | ORAL_TABLET | ORAL | Status: DC | PRN
  Administered 2014-04-24 – 2014-04-25 (×5): 2 via ORAL
  Filled 2014-04-24: qty 2

## 2014-04-24 MED ORDER — SENNA 8.6 MG PO TABS
ORAL_TABLET | ORAL | Status: AC
  Filled 2014-04-24: qty 1

## 2014-04-24 MED ORDER — HYDROMORPHONE HCL PF 1 MG/ML IJ SOLN
INTRAMUSCULAR | Status: AC
  Filled 2014-04-24: qty 1

## 2014-04-24 MED ORDER — FENTANYL CITRATE 0.05 MG/ML IJ SOLN
INTRAMUSCULAR | Status: AC
  Filled 2014-04-24: qty 4

## 2014-04-24 SURGICAL SUPPLY — 46 items
ADH SKN CLS APL DERMABOND .7 (GAUZE/BANDAGES/DRESSINGS)
BAG URINE DRAINAGE (UROLOGICAL SUPPLIES) ×4 IMPLANT
BLADE SURG 10 STRL SS (BLADE) ×4 IMPLANT
BLADE SURG 15 STRL LF DISP TIS (BLADE) ×2 IMPLANT
BLADE SURG 15 STRL SS (BLADE) ×4
BLADE SURG ROTATE 9660 (MISCELLANEOUS) ×4 IMPLANT
CANISTER SUCTION 2500CC (MISCELLANEOUS) ×6 IMPLANT
CATH FOLEY 2WAY SLVR  5CC 16FR (CATHETERS) ×2
CATH FOLEY 2WAY SLVR 5CC 16FR (CATHETERS) ×2 IMPLANT
COVER MAYO STAND STRL (DRAPES) ×4 IMPLANT
COVER TABLE BACK 60X90 (DRAPES) ×4 IMPLANT
DERMABOND ADVANCED (GAUZE/BANDAGES/DRESSINGS)
DERMABOND ADVANCED .7 DNX12 (GAUZE/BANDAGES/DRESSINGS) IMPLANT
DEVICE CAPIO SLIM BOX (INSTRUMENTS) IMPLANT
DRAPE CAMERA CLOSED 9X96 (DRAPES) ×4 IMPLANT
DRAPE UNDERBUTTOCKS STRL (DRAPE) ×4 IMPLANT
GAUZE SPONGE 4X4 16PLY XRAY LF (GAUZE/BANDAGES/DRESSINGS) IMPLANT
GLOVE BIO SURGEON STRL SZ7.5 (GLOVE) ×4 IMPLANT
GOWN STRL REUS W/ TWL XL LVL3 (GOWN DISPOSABLE) ×2 IMPLANT
GOWN STRL REUS W/TWL XL LVL3 (GOWN DISPOSABLE) ×8
MATRIX SURGICAL PSM 7X10CM (Tissue) ×3 IMPLANT
NEEDLE HYPO 22GX1.5 SAFETY (NEEDLE) ×5 IMPLANT
NS IRRIG 500ML POUR BTL (IV SOLUTION) ×4 IMPLANT
PACKING VAGINAL (PACKING) ×4 IMPLANT
PENCIL BUTTON HOLSTER BLD 10FT (ELECTRODE) ×4 IMPLANT
PLUG CATH AND CAP STER (CATHETERS) ×4 IMPLANT
RETRACTOR LONRSTAR 16.6X16.6CM (MISCELLANEOUS) ×2 IMPLANT
RETRACTOR STAY HOOK 5MM (MISCELLANEOUS) ×4 IMPLANT
RETRACTOR STER APS 16.6X16.6CM (MISCELLANEOUS) ×4
SET IRRIG Y TYPE TUR BLADDER L (SET/KITS/TRAYS/PACK) ×4 IMPLANT
SHEET LAVH (DRAPES) ×4 IMPLANT
SLING SOLYX SYSTEM SIS BX (SLING) IMPLANT
SPONGE LAP 4X18 X RAY DECT (DISPOSABLE) ×4 IMPLANT
SUCTION FRAZIER TIP 10 FR DISP (SUCTIONS) ×4 IMPLANT
SUT VIC AB 2-0 UR6 27 (SUTURE) ×10 IMPLANT
SUT VIC AB 3-0 SH 27 (SUTURE) ×8
SUT VIC AB 3-0 SH 27X BRD (SUTURE) ×2 IMPLANT
SYR BULB IRRIGATION 50ML (SYRINGE) ×4 IMPLANT
SYR CONTROL 10ML LL (SYRINGE) ×4 IMPLANT
SYRINGE 10CC LL (SYRINGE) ×4 IMPLANT
TRAY DSU PREP LF (CUSTOM PROCEDURE TRAY) ×4 IMPLANT
TUBE CONNECTING 12'X1/4 (SUCTIONS) ×2
TUBE CONNECTING 12X1/4 (SUCTIONS) ×6 IMPLANT
WATER STERILE IRR 3000ML UROMA (IV SOLUTION) ×4 IMPLANT
WATER STERILE IRR 500ML POUR (IV SOLUTION) ×4 IMPLANT
YANKAUER SUCT BULB TIP NO VENT (SUCTIONS) ×2 IMPLANT

## 2014-04-24 NOTE — Op Note (Signed)
Pre-operative diagnosis : Chronic vaginal apical pain and dyspareunia, post [employer] vaginal mesh anterior vault repair 2010  Postoperative diagnosis:  Same  Operation:  /Anterior vaginal vault expiration, excision of anterior vaginal wall mesh, insertion of ACell  Matristem  pelvic floor matrix.  Surgeon:  Chauncey Cruel. Gaynelle Arabian, MD  First assistant:  none  Anesthesia:  General LMA  Preparation:  Preanesthesia, the patient was brought to the operating room, placed on the operating table in the dorsal supine position where general LMA anesthesia was introduced. She was replaced in dorsal lithotomy position with pubis was prepped with Betadine solution and draped in usual  Fashion.e armband was double checked. History was doubly. It is noted that the patient is status post anterior vault repair and mid-urethral sling surgery in 2010, using [employer] uphold sacrospinous colposuspension for grade 2 cystocele clinically, but grade 3 cystocele noted under anesthesia. The patient did well postoperatively, but began to notice dyspareunia, as well as bilateral vaginal apical pain. Examination showed no evidence of vaginal mesh exposure, but the patient desired to have the mesh removed, and therefore she is now for vaginal expiration and mesh removal.  Review history:  43 YO female returns today for a 1 mo f/u with a hx of vaginal/pelvic pain which she continues to have. Husband notes "rough" area in vagina during vaginal intercourse on the L side of the vagina ( no bleeding or vaginal pain). However, pt has chronic s-p pain, worsened by intercourse.  She has intermittant diarrhea and normal BM. No constipation. S-p inguinal pain worsened by walking.  She was last seen by Jimmey Ralph, NP on 02/07/14 for 3 weeks has developed RLQ pain that is not well controlled with PRN Naproxen. Has seen PCP [facility]), and has had both Pelvic US and CT scan abd/pelvis that show no acute explanation of RLQ pain. Denies  changes in bowel habits, or dysuria, hematuria.  S/P EUA and excision of vaginal apical scar 07/09/12.  Pathology: cervix resection: apical scar. polypoid fragment of benign squamous mucosa with underlying foreign bodies and associated foreign body giant cell reactions. No dysplasia or malignancy.      Statement of  Likelihood of Success: Excellent. TIME-OUT observed.:  Procedure:  The previous semilunar incision is identified, and 50 cc of combination saline and Marcaine 25% with epinephrine 1 200,000 is injected for hydrodissection, as well as for postoperative local anesthetic affect. Semilunar incision is made, and a strip of scar vaginal tissue is removed.  Dissection was accomplished bilaterally to the level of the ischial spines, but no mesh could be identified. Finally, mesh was identified within the vaginal wall. This was carefully dissected and removed. Was sent to the pathology laboratory as per instructions from the patient's attorney, and pathology.  The patient had an EBL 700 cc during the procedure. The wound was irrigated copiously with saline. I elected to place ACell within the wound, to stimulate healing, with vaginal molding, and less vaginal scar. A 7 cm portion of a cell was selected, and measured, and cut to size. It was then placed, and sutured in place with 3-0 Vicryl sutures.  Following this, the vaginal wound was closed with 2 separate running 2-0 Vicryl sutures. Additional areas of vaginal scar were identified and excised nor to yield smoother vaginal epithelium. The bladder was then cystoscoped using both 12 and 70 lenses. The bladder and urethra. Normal. Clear reflux was seen from both orifices.  Vaginal packing placed and Foley catheter was replaced. The patient received  a B. and O. suppository, IV Tylenol, and IV Toradol. She was awakened, and taken to recovery room in good condition.

## 2014-04-24 NOTE — Anesthesia Preprocedure Evaluation (Signed)
Anesthesia Evaluation  Patient identified by MRN, date of birth, ID band Patient awake    Reviewed: Allergy & Precautions, H&P , NPO status , Patient's Chart, lab work & pertinent test results  Airway Mallampati: II TM Distance: >3 FB Neck ROM: Full    Dental no notable dental hx.    Pulmonary Current Smoker,  breath sounds clear to auscultation  Pulmonary exam normal       Cardiovascular negative cardio ROS  Rhythm:Regular Rate:Normal     Neuro/Psych PSYCHIATRIC DISORDERS Anxiety Depression Bipolar Disorder H/O anxiety d/o, bipolar d/o, alcoholism, panic attacksnegative neurological ROS     GI/Hepatic Neg liver ROS, GERD-  Medicated,  Endo/Other  negative endocrine ROS  Renal/GU negative Renal ROS  negative genitourinary   Musculoskeletal negative musculoskeletal ROS (+)   Abdominal   Peds negative pediatric ROS (+)  Hematology negative hematology ROS (+)   Anesthesia Other Findings   Reproductive/Obstetrics negative OB ROS                           Anesthesia Physical  Anesthesia Plan  ASA: II  Anesthesia Plan: General   Post-op Pain Management:    Induction: Intravenous  Airway Management Planned: LMA  Additional Equipment:   Intra-op Plan:   Post-operative Plan: Extubation in OR  Informed Consent: I have reviewed the patients History and Physical, chart, labs and discussed the procedure including the risks, benefits and alternatives for the proposed anesthesia with the patient or authorized representative who has indicated his/her understanding and acceptance.   Dental advisory given  Plan Discussed with: CRNA  Anesthesia Plan Comments:         Anesthesia Quick Evaluation

## 2014-04-24 NOTE — Transfer of Care (Signed)
Immediate Anesthesia Transfer of Care Note  Patient: Margaret Barker  Procedure(s) Performed: Procedure(s) (LRB): EXCISE OF VAGINAL APICAL MESH EXTRUSION, CYSTOSCOPY (N/A)  Patient Location: PACU  Anesthesia Type: General  Level of Consciousness: awake, oriented, sedated and patient cooperative  Airway & Oxygen Therapy: Patient Spontanous Breathing and Patient connected to face mask oxygen  Post-op Assessment: Report given to PACU RN and Post -op Vital signs reviewed and stable  Post vital signs: Reviewed and stable  Complications: No apparent anesthesia complications

## 2014-04-24 NOTE — Interval H&P Note (Signed)
History and Physical Interval Note:  04/24/2014 8:40 AM  Margaret Barker  has presented today for surgery, with the diagnosis of Vaginal Apical Mesh Extrusion  The various methods of treatment have been discussed with the patient and family. After consideration of risks, benefits and other options for treatment, the patient has consented to  Procedure(s): EXCISE OF VAGINAL APICAL MESH EXTRUSION (N/A) as a surgical intervention .  The patient's history has been reviewed, patient examined, no change in status, stable for surgery.  I have reviewed the patient's chart and labs.  Questions were answered to the patient's satisfaction.     Pt's  History again reviewed with her. She is post anterior vault repair in 2010 with mesh sacrospinus fixation for Grade 3 ( was not staging at that time) cystocele. Husband c/o feeling of mesh with intercourse, and pt c/o apical pain bilaterally. Exam negative for mesh exposure, with good vaginal mucosal estrogenization.    GOALS: Vaginal exploration, apical exploration, possible removal  Of offending tissue/mesh; implantation of A-Cell ( explained to pt).   Liklihood of success: Good. No mesh exposure, but pt examined in office, and will proceed to apex of vagina for exploration.   Alternate Rx: Pt is unable to tolerate Neurontin. No evidence of infection. Still has menstrual periods, so cannot give vaginal estrogens.Pain meds will noto cure problem.    Disability: Overnight hospital stay. 3 week for sutures to dissolve. Long acting local anesthetic, IV Tylenol and IV Toradol; and no intercourse until sutures dissolved.   Carolan Clines I

## 2014-04-24 NOTE — OR Nursing (Signed)
All staff present for entire case.

## 2014-04-24 NOTE — H&P (Signed)
Reason For Visit 1 mo f/u   Active Problems Problems  1. Vaginal pain (625.9)   Assessed By: Carolan Clines (Urology); Last Assessed: 10 Mar 2014  History of Present Illness     43 YO female returns today for a 1 mo f/u with a hx of vaginal/pelvic pain which she continues to have. Husband notes "rough" area in vagina during vaginal intercourse on the L side of the vagina ( no bleeding or vaginal pain). However, pt has chronic s-p pain, worsened by intercourse.   She has intermittant diarrhea and normal BM. No constipation. S-p inguinal pain worsened by walking.     She was last seen by Jimmey Ralph, NP on 02/07/14 for 3 weeks has developed RLQ pain that is not well controlled with PRN Naproxen. Has seen PCP [facility]), and has had both Pelvic US and CT scan abd/pelvis that show no acute explanation of RLQ pain. Denies changes in bowel habits, or dysuria, hematuria.     S/P EUA and excision of vaginal apical scar 07/09/12.    Pathology: cervix resection: apical scar. polypoid fragment of benign squamous mucosa with underlying foreign bodies and associated foreign body giant cell reactions. No dysplasia or malignancy.    Hx of pelvic pain with vaginal pentration. She saw Dr. Glendell Docker in [place] for mesh removal, but he deferred. (No notes are available).   She reports that personal care physician told her that she had "scar" tissue . She notes that she has dysparuena with pain with vaginal penetration, as well as pain with deep vaginal penetration. Has failed Diazepam vaginal supp in the past.      Review of operative note from 10/28 2010 notes a hx of mixed stress and urge incontinence, multiparity with stress component beginning after childbirth; evaluated 2 years post hysterectomy, with failure of both medications and pelvic floor exercises, and with long history of tobacco use. In addition, she was noted to have a long history of depression. At the time of surgery, she was noted to  have a Grade 3 cystocele, and underwent vault mesh suspension and bladder plication, and mid-urethral sling placement. She had uneventful post-operative course.      Currently taking Vesicare 10 mg for overactive bladder symptoms. She has previously tried Gelnique, but this was too "messy".   Past Medical History Problems  1. History of Anxiety (300.00) 2. History of depression (V11.8) 3. History of heartburn (V12.79) 4. History of hypercholesterolemia (V12.29)  Surgical History Problems  1. History of Anterior Colporrhaphy, Repair Of Cystocele 2. History of Biopsy Vaginal Extensive 3. History of Breast Surgery 4. History of Hysterectomy 5. History of Sacrospinous Ligament Fixation For Posthysterectomy  Prolapse 6. History of Vaginal Sling Operation For Stress Incontinence  Current Meds 1. ALPRAZolam 1 MG Oral Tablet;  Therapy: [id number] to Recorded 2. Atorvastatin Calcium 40 MG Oral Tablet;  Therapy: 06Apr2013 to Recorded 3. Lexapro 20 MG Oral Tablet;  Therapy: (Recorded:20Aug2010) to Recorded 4. Methocarbamol 500 MG Oral Tablet; TAKE 1 TABLET Twice daily PRN;  Therapy: 28Apr2015 to (Last Rx:28Apr2015)  Requested for: 28Apr2015  Ordered 5. Naproxen 500 MG Oral Tablet; TAKE 1 TABLET EVERY 12 HOURS AS  NEEDED;  Therapy: 10Apr2015 to (Evaluate:28May2015)  Requested for: [id number];  Last Rx:28Apr2015; Status: ACTIVE - Renewal Denied Ordered 6. Omeprazole 20 MG Oral Capsule Delayed Release;  Therapy: 08Oct2012 to Recorded 7. TraZODone HCl - 100 MG Oral Tablet;  Therapy: [id number] to Recorded 8. ValACYclovir HCl - 1 GM Oral Tablet;  Therapy: 15Sep2014 to Recorded 9. VESIcare 10 MG Oral Tablet; TAKE 1 TABLET qAM;  Therapy: 84ZYS0630 to (Evaluate:17Nov2015)  Requested for:  20Feb2015; Last Rx:20Feb2015 Ordered  Allergies Medication  1. Enablex TB24  Family History Problems  1. Family history of Blood In Urine : Father 2. Family history of Diabetes Mellitus (V18.0) :  Mother 3. Family history of Family Health Status - Father's Age   54yrs 4. Family history of Family Health Status - Mother's Age   80yrs 5. Family history of Family Health Status Number Of Children   1 son and 2 daughters 68. Family history of Nephrolithiasis : Father  Social History Problems  1. Denied: History of Alcohol Use 2. Caffeine Use   5 qd 3. Former Smoker   smoker for 88yrs, but non smoker for the past 28m 4. Marital History - Currently Married  Review of Systems Genitourinary, constitutional, skin, eye, otolaryngeal, hematologic/lymphatic, cardiovascular, pulmonary, endocrine, musculoskeletal, gastrointestinal, neurological and psychiatric system(s) were reviewed and pertinent findings if present are noted.  Genitourinary: pelvic pain and vaginal pain.  Gastrointestinal: abdominal pain.    Vitals Vital Signs [Data Includes: Last 1 Day]  Recorded: [id number] 01:13PM  Blood Pressure: 116 / 73 Temperature: 98.1 F Heart Rate: 74  Physical Exam Constitutional: Well nourished and well developed . No acute distress. The patient appears well hydrated.  ENT:. The ears and nose are normal in appearance.  Neck: The appearance of the neck is normal.  Pulmonary: No respiratory distress.  Cardiovascular: Heart rate and rhythm are normal.  Abdomen: The abdomen is soft and nontender. No masses are palpated. No CVA tenderness. No hernias are palpable. No hepatosplenomegaly noted.  Genitourinary:. Rough area horizental at apex vagina.  Chaperone Present: kim lewis.  Examination of the external genitalia shows normal female external genitalia. The urethra is normal in appearance and not tender. There is no urethral mass. Urethral hypermobility is not present. Vaginal exam demonstrates tenderness, but no atrophy, no discharge, the vaginal epithelium to be well estrogenized and no uterine prolapse. No cystocele is identified. No enterocele is identified. No rectocele is identified.  There is no evidence of a vesicovaginal fistula. The cervix is is absent. The uterus is absent. The adnexa are palpably normal. The bladder is non tender, not distended and without masses.  Lymphatics: The femoral and inguinal nodes are not enlarged or tender.  Skin: Normal skin turgor, no visible rash and no visible skin lesions.  Neuro/Psych:. Mood and affect are appropriate.    Results/Data Urine [Data Includes: Last 1 Day]   [id number]  COLOR YELLOW   APPEARANCE CLEAR   SPECIFIC GRAVITY 1.025   pH 6.0   GLUCOSE NEG mg/dL  BILIRUBIN NEG   KETONE NEG mg/dL  BLOOD MOD   PROTEIN NEG mg/dL  UROBILINOGEN 0.2 mg/dL  NITRITE NEG   LEUKOCYTE ESTERASE NEG   SQUAMOUS EPITHELIAL/HPF MODERATE   WBC 0-2 WBC/hpf  RBC 3-6 RBC/hpf  BACTERIA FEW   CRYSTALS NONE SEEN   CASTS NONE SEEN   Other MUCUS NOTED    Assessment Assessed  1. Vaginal pain (625.9) 2. Erosion of implanted vaginal mesh and other prosthetic materials to  surrounding organ or tissue (629.31)  43 yo female on Medicare and Medicaid for disability from severe depression.    She is complaining of chronic s-p pain, and pelvic exam shows pain at her vaginal apex. She complains that her husband is complaining of pain and roughness deep in vagina. Exam shows roughness ( ?) more mesh extrusion.  We wil explore area with wide excision, and will place A-Cell over apex to help closure. She wants to stay overnight , and will arrange at [facility].   Plan Health Maintenance  1. UA With REFLEX; [Do Not Release]; Status:Resulted - Requires  Verification;   Done: [id number] 12:51PM Vaginal pain  2. Pelvic Exam; Status:Complete;   Done: [id number]  Escise vaginal apical mesh extrusion, and cover with A-cell.to help stimulate stem cell healing of wound.   Discussion/Summary [facility]     Signatures Electronically signed by : Carolan Clines, M.D.; Mar 10 2014  4:45PM EST

## 2014-04-24 NOTE — Anesthesia Procedure Notes (Signed)
Procedure Name: LMA Insertion Date/Time: 04/24/2014 8:55 AM Performed by: Denna Haggard D Pre-anesthesia Checklist: Patient identified, Emergency Drugs available, Suction available and Patient being monitored Patient Re-evaluated:Patient Re-evaluated prior to inductionOxygen Delivery Method: Circle System Utilized Preoxygenation: Pre-oxygenation with 100% oxygen Intubation Type: IV induction Ventilation: Mask ventilation without difficulty LMA: LMA inserted LMA Size: 4.0 Number of attempts: 1 Airway Equipment and Method: bite block Placement Confirmation: positive ETCO2 Tube secured with: Tape Dental Injury: Teeth and Oropharynx as per pre-operative assessment

## 2014-04-24 NOTE — Anesthesia Postprocedure Evaluation (Signed)
  Anesthesia Post-op Note  Patient: Margaret Barker  Procedure(s) Performed: Procedure(s) (LRB): EXCISE OF VAGINAL APICAL MESH EXTRUSION (N/A) CYSTOSCOPY (N/A)  Patient Location: PACU  Anesthesia Type: General  Level of Consciousness: awake and alert   Airway and Oxygen Therapy: Patient Spontanous Breathing  Post-op Pain: mild  Post-op Assessment: Post-op Vital signs reviewed, Patient's Cardiovascular Status Stable, Respiratory Function Stable, Patent Airway and No signs of Nausea or vomiting  Last Vitals:  Filed Vitals:   04/24/14 1215  BP: 89/60  Pulse: 77  Temp:   Resp: 15    Post-op Vital Signs: stable   Complications: No apparent anesthesia complications

## 2014-04-25 ENCOUNTER — Encounter (HOSPITAL_BASED_OUTPATIENT_CLINIC_OR_DEPARTMENT_OTHER): Payer: Self-pay | Admitting: Urology

## 2014-04-25 DIAGNOSIS — T83711A Erosion of implanted vaginal mesh and other prosthetic materials to surrounding organ or tissue, initial encounter: Secondary | ICD-10-CM | POA: Diagnosis not present

## 2014-04-25 MED ORDER — TRAMADOL-ACETAMINOPHEN 37.5-325 MG PO TABS: 1.0000 | ORAL_TABLET | Freq: Four times a day (QID) | ORAL | Status: DC | PRN

## 2014-04-25 MED ORDER — OXYCODONE-ACETAMINOPHEN 5-325 MG PO TABS
ORAL_TABLET | ORAL | Status: AC
  Filled 2014-04-25: qty 2

## 2014-04-25 NOTE — Discharge Summary (Signed)
  Physician Discharge Summary  Patient ID: Margaret Barker MRN: [id number] DOB/AGE: 1971/05/22 43 y.o.  Admit date: 04/24/2014 Discharge date: 04/25/2014  Admission Diagnoses:  Chronic vaginal pain 2ndary mesh anterior vaginal vault repair Discharge Diagnoses: Same Active Problems:   Prolapse of vaginal wall with midline cystocele   Discharged Condition: improved  Hospital Course:   Surgery 04/24/14  Consults: none  Significant Diagnostic Studies: No results found.  Treatments: surgery  Discharge Exam: Blood pressure 99/80, pulse 75, temperature 97 F (36.1 C), temperature source Oral, resp. rate 18, weight 65.772 kg (145 lb), SpO2 95.00%. Benign abd. Benign s-p exam. Slight L inguinal discomfort.   Disposition: 01-Home or Self Care  Discharge Instructions   Discharge patient    Complete by:  As directed      Discharge patient    Complete by:  As directed      Discontinue IV    Complete by:  As directed             Medication List         ALPRAZolam 0.5 MG tablet  Commonly known as:  XANAX  Take 0.5 mg by mouth 3 (three) times daily as needed. 1 tab by mouth three times a day as needed anxiety     atorvastatin 80 MG tablet  Commonly known as:  LIPITOR  Take 80 mg by mouth every morning.     esomeprazole 40 MG capsule  Commonly known as:  NEXIUM  Take 40 mg by mouth every morning.     LEXAPRO 20 MG tablet  Generic drug:  escitalopram  Take 20 mg by mouth every morning.     promethazine 25 MG tablet  Commonly known as:  PHENERGAN  Take 1 tablet (25 mg total) by mouth every 8 (eight) hours as needed for nausea or vomiting.     solifenacin 10 MG tablet  Commonly known as:  VESICARE  Take 10 mg by mouth every morning.     traMADol-acetaminophen 37.5-325 MG per tablet  Commonly known as:  ULTRACET  Take 1 tablet by mouth every 6 (six) hours as needed.     traZODone 100 MG tablet  Commonly known as:  DESYREL  Take 100 mg by mouth at bedtime.     valACYclovir 1000 MG tablet  Commonly known as:  VALTREX  TAKE ONE TABLET BY MOUTH TWICE DAILY           Follow-up Information   Follow up with Ailene Rud, MD.   Specialty:  Urology   Contact information:   [place] [address] [phone]      No tractor or lawnmower.  No sexual activity for 6 weeks Sutures dissolve 3-4 weeks 20 lb weight lift limit for 6 weeks Dissolvable tissue will take 6-9 months to mold and dissolve, but with minimal scarring. RTC per appointment  Signed: Carolan Clines I 04/25/2014, 8:01 AM

## 2014-04-25 NOTE — Progress Notes (Signed)
Foley catheter and vaginal packing removed per order.  Minimal bleeding noted from vagina.  Adequate UOP noted for shift.  Pt tolerated procedures well.  No c/o pain

## 2014-04-25 NOTE — Progress Notes (Signed)
Urology Progress Note  1 Day Post-Op   Subjective: Packing oout. Foley out. Pt has voided x 3.     No acute urologic events overnight. Ambulation:   positive Flatus:    positive Bowel movement  negative  Pain: No vaginal pain. She reports slight Left inguinal pain.   Objective:  Blood pressure 99/80, pulse 75, temperature 97 F (36.1 C), temperature source Oral, resp. rate 18, weight 65.772 kg (145 lb), SpO2 95.00%.  Physical Exam:  General:  No acute distress, awake ABD: No hernia. Normal BS.  Genitourinary:    Normal External BUS. No bleeding.  Foley:out    I/O last 3 completed shifts: In: 3483.3 [P.O.:700; I.V.:2783.3] Out: 2244 [Urine:3950; Blood:700]  No results found for this basename: HGB, WBC, PLT,  in the last 72 hours  No results found for this basename: NA, K, CL, CO2, BUN, CREATININE, CALCIUM, MAGNESIUM, GFRNONAA, GFRAA,  in the last 72 hours   No results found for this basename: PT, INR, APTT,  in the last 72 hours   No components found with this basename: ABG,   Assessment/Plan:  Wound care discussed. Lifting lb limit.  No bouncing. No lawnmower.

## 2014-04-25 NOTE — Discharge Instructions (Addendum)
Anterior and Posterior Colporrhaphy Anterior or posterior colporrhaphy is surgery to fix a prolapse of organs in the genital tract. Prolapse means the falling down, bulging, dropping, or drooping of an organ. Organs that commonly prolapse include the rectum, bladder, vagina, and uterus. Prolapse can affect a single organ or several organs at the same time. This often worsens when women stop having their monthly periods (menopause) because estrogen loss weakens the muscles and tissues in the genital tract. In addition, prolapse happens when the organs are damaged or weakened. This commonly happens after childbirth and as a result of aging. Surgery is often done for severe prolapses.  The type of colporrhaphy done depends on the type of genital prolapse. Types of genital prolapse include the following:   Cystocele. This is a prolapse of the upper (anterior) wall of the vagina. The anterior wall bulges into the vagina and brings the bladder with it.   Rectocele. This is a prolapse of the lower (posterior) wall of the vagina. The posterior vaginal wall bulges into the vagina and brings the rectum with it.   Enterocele. This is a prolapse of part of the pelvic organs called the pouch of Douglas. It also involves a portion of the small bowel. It appears as a bulge under the neck of the uterus at the top of the back wall of the vagina.   Procidentia. This is a complete prolapse of the uterus and the cervix. The prolapse can be seen and felt coming out of the vagina. LET [facility] CARE PROVIDER KNOW ABOUT:   Any allergies you have.   All medicines you are taking, including vitamins, herbs, eye drops, creams, and over-the-counter medicines.   Previous problems you or members of your family have had with the use of anesthetics.   Any blood disorders you have.   Previous surgeries you have had.   Medical conditions you have.   Smoking history or history of alcohol use.   Possibility of  pregnancy, if this applies.  RISKS AND COMPLICATIONS Generally, anterior or posterior colporrhaphy is a safe procedure. However, as with any procedure, complications can occur. Possible complications include:   Infection.   Damage to other organs during surgery.   Bleeding after surgery.   Problems urinating.   Problems from the anesthetic.  BEFORE THE PROCEDURE  Ask your health care provider about changing or stopping your regular medicines.   Do not eat or drink anything for at least 8 hours before the surgery.   If you smoke, do not smoke for at least 2 weeks before the surgery.   Make plans to have someone drive you home after your hospital stay. Also, arrange for someone to help you with activities during recovery. PROCEDURE  You may be given medicine to help you relax before the surgery (sedative). During the surgery you will be given medicine to make you sleep through the procedure (general anesthetic) or medicine to numb you from the waist down (spinal anesthetic). This medicine will be given through an intravenous (IV) access tube that is put into one of your veins.  The procedure will vary depending on the type of repair:   Anterior repair. A cut (incision) is made in the midline section of the front part of the vaginal wall. A triangular-shaped piece of vaginal tissue is removed, and the stronger, healthier tissue is sewn together in order to support and suspend the bladder.   Posterior repair. An incision is made midline on the back wall of the  vagina. A triangular portion of vaginal skin is removed to expose the muscle. Excess tissue is removed, and stronger, healthier muscle and ligament tissue is sewn together to support the rectum.   Anterior and posterior repair. Both procedures are done during the same surgery. AFTER THE PROCEDURE You will be taken to a recovery area. Your blood pressure, pulse, breathing, and temperature (vital signs) will be monitored.  This is done until you are stable. Then you will be transferred to a hospital room.  After surgery, you will have a small rubber tube in place to drain your bladder (urinary catheter). This will be in place for 2 to 7 days or until your bladder is working properly on its own. The IV access tube will be removed in 1 to 3 days. You may have a gauze packing in your vagina to prevent bleeding. This will be removed 2 or 3 days after the surgery. You will likely need to stay in the hospital for 3 to 5 days.  Document Released: 12/20/2003 Document Revised: 06/01/2013 Document Reviewed: 02/18/2013 [facility] Patient Information 2015 [place], [place]. This information is not intended to replace advice given to you by your health care provider. Make sure you discuss any questions you have with your health care provider.   Post Anesthesia Home Care Instructions  Activity: Get plenty of rest for the remainder of the day. A responsible adult should stay with you for 24 hours following the procedure.  For the next 24 hours, DO NOT: -Drive a car -Paediatric nurse -Drink alcoholic beverages -Take any medication unless instructed by your physician -Make any legal decisions or sign important papers.  Meals: Start with liquid foods such as gelatin or soup. Progress to regular foods as tolerated. Avoid greasy, spicy, heavy foods. If nausea and/or vomiting occur, drink only clear liquids until the nausea and/or vomiting subsides. Call your physician if vomiting continues.  Special Instructions/Symptoms: Your throat may feel dry or sore from the anesthesia or the breathing tube placed in your throat during surgery. If this causes discomfort, gargle with warm salt water. The discomfort should disappear within 24 hours.

## 2014-04-25 NOTE — Progress Notes (Signed)
Pt up in halls w/ standby asst-walked 2 laps around unit w/o c/o dizziness.

## 2014-05-02 DIAGNOSIS — N949 Unspecified condition associated with female genital organs and menstrual cycle: Secondary | ICD-10-CM | POA: Diagnosis not present

## 2014-05-08 DIAGNOSIS — N949 Unspecified condition associated with female genital organs and menstrual cycle: Secondary | ICD-10-CM | POA: Diagnosis not present

## 2014-05-23 ENCOUNTER — Other Ambulatory Visit: Payer: Self-pay | Admitting: Family Medicine

## 2014-05-24 NOTE — Telephone Encounter (Signed)
Last ov 6/15. Pt just received Phenergan refill on 7/15 by another provider with 3 refills.

## 2014-06-05 DIAGNOSIS — N8111 Cystocele, midline: Secondary | ICD-10-CM | POA: Diagnosis not present

## 2014-06-29 DIAGNOSIS — F331 Major depressive disorder, recurrent, moderate: Secondary | ICD-10-CM | POA: Diagnosis not present

## 2014-06-30 ENCOUNTER — Ambulatory Visit: Payer: Medicare Other | Admitting: Gastroenterology

## 2014-07-03 ENCOUNTER — Telehealth: Payer: Self-pay | Admitting: Family Medicine

## 2014-07-03 ENCOUNTER — Other Ambulatory Visit: Payer: Self-pay | Admitting: Family Medicine

## 2014-07-03 DIAGNOSIS — Z23 Encounter for immunization: Secondary | ICD-10-CM | POA: Diagnosis not present

## 2014-07-03 MED ORDER — FLUCONAZOLE 150 MG PO TABS: 150.0000 mg | ORAL_TABLET | Freq: Once | ORAL | Status: DC

## 2014-07-03 NOTE — Telephone Encounter (Signed)
Diflucan sent to pharm 

## 2014-07-20 ENCOUNTER — Encounter: Payer: Self-pay | Admitting: Gastroenterology

## 2014-07-20 ENCOUNTER — Ambulatory Visit (INDEPENDENT_AMBULATORY_CARE_PROVIDER_SITE_OTHER): Payer: Medicare Other | Admitting: Gastroenterology

## 2014-07-20 VITALS — BP 90/58 | HR 80 | Ht 63.75 in | Wt 130.0 lb

## 2014-07-20 DIAGNOSIS — R110 Nausea: Secondary | ICD-10-CM | POA: Diagnosis not present

## 2014-07-20 DIAGNOSIS — R1084 Generalized abdominal pain: Secondary | ICD-10-CM | POA: Diagnosis not present

## 2014-07-20 MED ORDER — RANITIDINE HCL 150 MG PO TABS: 150.0000 mg | ORAL_TABLET | Freq: Two times a day (BID) | ORAL | Status: DC

## 2014-07-20 MED ORDER — HYOSCYAMINE SULFATE 0.125 MG SL SUBL: 0.2500 mg | SUBLINGUAL_TABLET | SUBLINGUAL | Status: DC | PRN

## 2014-07-20 NOTE — Progress Notes (Signed)
      History of Present Illness:  Margaret Barker has returned for ongoing evaluation of nausea and abdominal pain.  Nausea continues.  It is unrelated to meals and seems to occur throughout the day.  About once a month she develops severe upper epigastric pain with radiation to the back.  She states that pain is similar to gallbladder pain that she experienced prior to cholecystectomy.  CT of the abdomen and pelvis in April, 2015 did not show any biliary tract abnormalities.   Review of Systems: Pertinent positive and negative review of systems were noted in the above HPI section. All other review of systems were otherwise negative.    Current Medications, Allergies, Past Medical History, Past Surgical History, Family History and Social History were reviewed in [facility] record  Vital signs were reviewed in today's medical record. Physical Exam: General: Well developed , well nourished, no acute distress   See Assessment and Plan under Problem List

## 2014-07-20 NOTE — Assessment & Plan Note (Signed)
Patient claims that intermittent severe upper abdominal pain is reminiscent of gallbladder pain.  CT scan from April was unremarkable.  A recurrent bile duct stone is a consideration, albeit  an unlikely one  Recommendations #1 stat LFTs during episode of pain #2 hyomax sublingual when necessary pain

## 2014-07-20 NOTE — Assessment & Plan Note (Addendum)
Freestanding nausea could be a medication effect.  The most recent change in medications was the addition of Nexium  Recommendations #1 hold Nexium; begin ranitidine 150 mg twice a day  Patient was told to call back in one to 2 weeks if not improved or if reflux worsens

## 2014-07-20 NOTE — Patient Instructions (Signed)
Discontinue Nexium Call back in one week if nausea continues We are sending in Ranitidine and Hyomax to your pharmacy  If hyomax does not work for the abdominal pain contact our office for stat Liver function studies

## 2014-07-27 ENCOUNTER — Telehealth: Payer: Self-pay | Admitting: Family Medicine

## 2014-07-27 ENCOUNTER — Ambulatory Visit (INDEPENDENT_AMBULATORY_CARE_PROVIDER_SITE_OTHER): Payer: Medicare Other | Admitting: Family

## 2014-07-27 ENCOUNTER — Encounter: Payer: Self-pay | Admitting: Family

## 2014-07-27 VITALS — BP 103/67 | HR 61 | Temp 97.3°F | Ht 63.75 in | Wt 131.6 lb

## 2014-07-27 DIAGNOSIS — H8110 Benign paroxysmal vertigo, unspecified ear: Secondary | ICD-10-CM | POA: Diagnosis not present

## 2014-07-27 MED ORDER — MECLIZINE HCL 25 MG PO TABS: 25.0000 mg | ORAL_TABLET | Freq: Three times a day (TID) | ORAL | Status: DC | PRN

## 2014-07-27 NOTE — Patient Instructions (Signed)
Benign Positional Vertigo Vertigo means you feel like you or your surroundings are moving when they are not. Benign positional vertigo is the most common form of vertigo. Benign means that the cause of your condition is not serious. Benign positional vertigo is more common in older adults. CAUSES  Benign positional vertigo is the result of an upset in the labyrinth system. This is an area in the middle ear that helps control your balance. This may be caused by a viral infection, head injury, or repetitive motion. However, often no specific cause is found. SYMPTOMS  Symptoms of benign positional vertigo occur when you move your head or eyes in different directions. Some of the symptoms may include:  Loss of balance and falls.  Vomiting.  Blurred vision.  Dizziness.  Nausea.  Involuntary eye movements (nystagmus). DIAGNOSIS  Benign positional vertigo is usually diagnosed by physical exam. If the specific cause of your benign positional vertigo is unknown, your caregiver may perform imaging tests, such as magnetic resonance imaging (MRI) or computed tomography (CT). TREATMENT  Your caregiver may recommend movements or procedures to correct the benign positional vertigo. Medicines such as meclizine, benzodiazepines, and medicines for nausea may be used to treat your symptoms. In rare cases, if your symptoms are caused by certain conditions that affect the inner ear, you may need surgery. HOME CARE INSTRUCTIONS   Follow your caregiver's instructions.  Move slowly. Do not make sudden body or head movements.  Avoid driving.  Avoid operating heavy machinery.  Avoid performing any tasks that would be dangerous to you or others during a vertigo episode.  Drink enough fluids to keep your urine clear or pale yellow. SEEK IMMEDIATE MEDICAL CARE IF:   You develop problems with walking, weakness, numbness, or using your arms, hands, or legs.  You have difficulty speaking.  You develop  severe headaches.  Your nausea or vomiting continues or gets worse.  You develop visual changes.  Your family or friends notice any behavioral changes.  Your condition gets worse.  You have a fever.  You develop a stiff neck or sensitivity to light. MAKE SURE YOU:   Understand these instructions.  Will watch your condition.  Will get help right away if you are not doing well or get worse. Document Released: 07/07/2006 Document Revised: 12/22/2011 Document Reviewed: 06/19/2011 [employer] Patient Information 2015 [employer]. This information is not intended to replace advice given to you by your health care provider. Make sure you discuss any questions you have with your health care provider.    

## 2014-07-27 NOTE — Progress Notes (Signed)
   Subjective:    Patient ID: Margaret Barker, female    DOB: [birth year], 43 y.o.   MRN: [id number]  Dizziness This is a new problem. The current episode started yesterday. The problem occurs constantly. The problem has been unchanged. Associated symptoms include myalgias, vertigo and a visual change. Pertinent negatives include no chills, congestion, coughing, headaches, nausea, sore throat or vomiting. The symptoms are aggravated by bending. She has tried nothing for the symptoms. The treatment provided no relief.      Review of Systems  Constitutional: Negative.  Negative for chills.  HENT: Negative.  Negative for congestion and sore throat.   Eyes: Negative.   Respiratory: Negative.  Negative for cough and shortness of breath.   Cardiovascular: Negative.  Negative for palpitations.  Gastrointestinal: Negative.  Negative for nausea and vomiting.  Endocrine: Negative.   Genitourinary: Negative.   Musculoskeletal: Positive for myalgias.  Neurological: Positive for dizziness and vertigo. Negative for headaches.  Hematological: Negative.   Psychiatric/Behavioral: Negative.   All other systems reviewed and are negative.      Objective:   Physical Exam  Vitals reviewed. Constitutional: She is oriented to person, place, and time. She appears well-developed and well-nourished. No distress.  HENT:  Head: Normocephalic and atraumatic.  Right Ear: External ear normal.  Left Ear: External ear normal.  Nose: Nose normal.  Mouth/Throat: Oropharynx is clear and moist.  Eyes: Pupils are equal, round, and reactive to light.  Neck: Normal range of motion. Neck supple. No thyromegaly present.  Cardiovascular: Normal rate, regular rhythm, normal heart sounds and intact distal pulses.   No murmur heard. Pulmonary/Chest: Effort normal and breath sounds normal. No respiratory distress. She has no wheezes.  Abdominal: Soft. Bowel sounds are normal. She exhibits no distension. There is no  tenderness.  Musculoskeletal: Normal range of motion. She exhibits no edema and no tenderness.  Neurological: She is alert and oriented to person, place, and time. She has normal reflexes. No cranial nerve deficit.  Skin: Skin is warm and dry.  Psychiatric: She has a normal mood and affect. Her behavior is normal. Judgment and thought content normal.   BP 103/67  Pulse 61  Temp(Src) 97.3 F (36.3 C) (Oral)  Ht 5' 3.75" (1.619 m)  Wt 131 lb 9.6 oz (59.693 kg)  BMI 22.77 kg/m2        Assessment & Plan:  1. BPPV (benign paroxysmal positional vertigo), unspecified laterality -Falls precaution discussed -Slow movements - meclizine (ANTIVERT) 25 MG tablet; Take 1 tablet (25 mg total) by mouth 3 (three) times daily as needed for dizziness.  Dispense: 30 tablet; Refill: [place], FNP

## 2014-07-27 NOTE — Telephone Encounter (Signed)
Appointment given for today.

## 2014-08-03 ENCOUNTER — Other Ambulatory Visit: Payer: Self-pay | Admitting: Family Medicine

## 2014-08-04 NOTE — Telephone Encounter (Signed)
30 and NTBS for lipids

## 2014-08-07 ENCOUNTER — Encounter (HOSPITAL_COMMUNITY): Payer: Self-pay | Admitting: Emergency Medicine

## 2014-08-07 ENCOUNTER — Emergency Department (HOSPITAL_COMMUNITY)
Admission: EM | Admit: 2014-08-07 | Discharge: 2014-08-07 | Disposition: A | Discharge status: Home / Self Care | Payer: Medicare Other | Attending: Emergency Medicine | Admitting: Emergency Medicine

## 2014-08-07 ENCOUNTER — Telehealth: Payer: Self-pay | Admitting: Nurse Practitioner

## 2014-08-07 DIAGNOSIS — Z8619 Personal history of other infectious and parasitic diseases: Secondary | ICD-10-CM | POA: Diagnosis not present

## 2014-08-07 DIAGNOSIS — Z79899 Other long term (current) drug therapy: Secondary | ICD-10-CM | POA: Insufficient documentation

## 2014-08-07 DIAGNOSIS — Z720 Tobacco use: Secondary | ICD-10-CM | POA: Diagnosis not present

## 2014-08-07 DIAGNOSIS — R42 Dizziness and giddiness: Secondary | ICD-10-CM | POA: Insufficient documentation

## 2014-08-07 DIAGNOSIS — Y9389 Activity, other specified: Secondary | ICD-10-CM | POA: Diagnosis not present

## 2014-08-07 DIAGNOSIS — S199XXA Unspecified injury of neck, initial encounter: Secondary | ICD-10-CM | POA: Diagnosis not present

## 2014-08-07 DIAGNOSIS — Y92410 Unspecified street and highway as the place of occurrence of the external cause: Secondary | ICD-10-CM | POA: Diagnosis not present

## 2014-08-07 DIAGNOSIS — E785 Hyperlipidemia, unspecified: Secondary | ICD-10-CM | POA: Diagnosis not present

## 2014-08-07 DIAGNOSIS — S4992XA Unspecified injury of left shoulder and upper arm, initial encounter: Secondary | ICD-10-CM | POA: Diagnosis present

## 2014-08-07 DIAGNOSIS — S46012A Strain of muscle(s) and tendon(s) of the rotator cuff of left shoulder, initial encounter: Secondary | ICD-10-CM | POA: Diagnosis not present

## 2014-08-07 DIAGNOSIS — T07XXXA Unspecified multiple injuries, initial encounter: Secondary | ICD-10-CM

## 2014-08-07 DIAGNOSIS — K219 Gastro-esophageal reflux disease without esophagitis: Secondary | ICD-10-CM | POA: Insufficient documentation

## 2014-08-07 DIAGNOSIS — S39012A Strain of muscle, fascia and tendon of lower back, initial encounter: Secondary | ICD-10-CM | POA: Diagnosis not present

## 2014-08-07 MED ORDER — BACLOFEN 10 MG PO TABS: 10.0000 mg | ORAL_TABLET | Freq: Three times a day (TID) | ORAL | Status: AC

## 2014-08-07 MED ORDER — TRAMADOL HCL 50 MG PO TABS: ORAL_TABLET | ORAL | Status: DC

## 2014-08-07 MED ORDER — DEXAMETHASONE 4 MG PO TABS: ORAL_TABLET | ORAL | Status: DC

## 2014-08-07 NOTE — Discharge Instructions (Signed)

## 2014-08-07 NOTE — Telephone Encounter (Signed)
appt made
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
## 2014-08-08 ENCOUNTER — Ambulatory Visit: Payer: Medicare Other | Admitting: Family Medicine

## 2014-08-09 ENCOUNTER — Other Ambulatory Visit: Payer: Self-pay | Admitting: *Deleted

## 2014-08-09 DIAGNOSIS — R911 Solitary pulmonary nodule: Secondary | ICD-10-CM

## 2014-08-10 NOTE — ED Provider Notes (Signed)
Medical screening examination/treatment/procedure(s) were performed by non-physician practitioner and as supervising physician I was immediately available for consultation/collaboration.   EKG Interpretation None        Delice Bison Ward, DO 08/10/14 1220

## 2014-08-16 ENCOUNTER — Ambulatory Visit (HOSPITAL_COMMUNITY): Payer: Medicare Other

## 2014-08-16 ENCOUNTER — Ambulatory Visit (INDEPENDENT_AMBULATORY_CARE_PROVIDER_SITE_OTHER): Payer: Medicare Other | Admitting: Nurse Practitioner

## 2014-08-16 ENCOUNTER — Encounter: Payer: Self-pay | Admitting: Nurse Practitioner

## 2014-08-16 VITALS — BP 98/69 | HR 59 | Temp 97.5°F | Ht 63.75 in | Wt 124.8 lb

## 2014-08-16 DIAGNOSIS — R918 Other nonspecific abnormal finding of lung field: Secondary | ICD-10-CM

## 2014-08-16 DIAGNOSIS — R938 Abnormal findings on diagnostic imaging of other specified body structures: Secondary | ICD-10-CM

## 2014-08-16 DIAGNOSIS — L600 Ingrowing nail: Secondary | ICD-10-CM

## 2014-08-16 MED ORDER — SULFAMETHOXAZOLE-TRIMETHOPRIM 400-80 MG PO TABS
1.0000 | ORAL_TABLET | Freq: Two times a day (BID) | ORAL | Status: DC
Start: 2014-08-16 — End: 2014-09-26

## 2014-08-16 NOTE — Patient Instructions (Signed)
Infected Ingrown Toenail °An infected ingrown toenail occurs when the nail edge grows into the skin and bacteria invade the area. Symptoms include pain, tenderness, swelling, and pus drainage from the edge of the nail. Poorly fitting shoes, minor injuries, and improper cutting of the toenail may also contribute to the problem. You should cut your toenails squarely instead of rounding the edges. Do not cut them too short. Avoid tight or pointed toe shoes. Sometimes the ingrown portion of the nail must be removed. If your toenail is removed, it can take 3-4 months for it to re-grow. °HOME CARE INSTRUCTIONS  °· Soak your infected toe in warm water for 20-30 minutes, 2 to 3 times a day. °· Packing or dressings applied to the area should be changed daily. °· Take medicine as directed and finish them. °· Reduce activities and keep your foot elevated when able to reduce swelling and discomfort. Do this until the infection gets better. °· Wear sandals or go barefoot as much as possible while the infected area is sensitive. °· See your caregiver for follow-up care in 2-3 days if the infection is not better. °SEEK MEDICAL CARE IF:  °Your toe is becoming more red, swollen or painful. °MAKE SURE YOU:  °· Understand these instructions. °· Will watch your condition. °· Will get help right away if you are not doing well or get worse. °Document Released: 11/06/2004 Document Revised: 12/22/2011 Document Reviewed: 09/25/2008 °ExitCare® Patient Information ©2015 [employer]. This information is not intended to replace advice given to you by your health care provider. Make sure you discuss any questions you have with your health care provider. ° °

## 2014-08-16 NOTE — Progress Notes (Signed)
   Subjective:    Patient ID: Margaret Barker, female    DOB: [birth year], 43 y.o.   MRN: [id number]  HPI Patient is here to obtain an referral for a chest CT, she had an abdominal CT done in April and subpleural nodule were found in the right middle lobe.   Patient complaints of an ingrown toenail of the left big toe. She reports cutting her nail to short about a week ago. She reports serous drainage and pain of 4/10.    Review of Systems  Musculoskeletal:       Left big toe, ingrown toenail.   All other systems reviewed and are negative.      Objective:   Physical Exam  Constitutional: She is oriented to person, place, and time. She appears well-developed and well-nourished.  HENT:  Head: Normocephalic and atraumatic.  Eyes: Conjunctivae and EOM are normal. Pupils are equal, round, and reactive to light.  Neck: Normal range of motion. Neck supple.  Cardiovascular: Normal rate and regular rhythm.   Pulmonary/Chest: Effort normal and breath sounds normal.  Musculoskeletal: Normal range of motion.  Neurological: She is alert and oriented to person, place, and time. She has normal reflexes.  Skin: Skin is warm.  Erythematous medial borderof left great toe nail- no drainage  Psychiatric: She has a normal mood and affect. Her behavior is normal. Judgment and thought content normal.    BP 98/69 mmHg  Pulse 59  Temp(Src) 97.5 F (36.4 C) (Oral)  Ht 5' 3.75" (1.619 m)  Wt 124 lb 12.8 oz (56.609 kg)  BMI 21.60 kg/m2       Assessment & Plan:  1. Abnormal chest x-ray with multiple nodules - CT Chest Wo Contrast; Future  2. Ingrown left greater toenail Discussed wedging toenail- patient will look up on internet IF not improving RTO to have cut out - sulfamethoxazole-trimethoprim (BACTRIM) 400-80 MG per tablet; Take 1 tablet by mouth 2 (two) times daily.  Dispense: 20 tablet; Refill: 0  Mary-Margaret Hassell Done, FNP

## 2014-08-18 ENCOUNTER — Ambulatory Visit: Payer: Self-pay | Admitting: Family Medicine

## 2014-08-21 ENCOUNTER — Ambulatory Visit (HOSPITAL_COMMUNITY)
Admission: RE | Admit: 2014-08-21 | Discharge: 2014-08-21 | Disposition: A | Discharge status: Home / Self Care | Payer: Medicare Other | Source: Ambulatory Visit | Attending: Nurse Practitioner | Admitting: Nurse Practitioner

## 2014-08-21 DIAGNOSIS — R918 Other nonspecific abnormal finding of lung field: Secondary | ICD-10-CM | POA: Diagnosis not present

## 2014-08-22 ENCOUNTER — Telehealth: Payer: Self-pay | Admitting: Family Medicine

## 2014-08-22 NOTE — Telephone Encounter (Signed)
Patient states that she has a area that looks like a blister on her eye. Patient advised to call eye doctor to have them look at it.

## 2014-09-03 ENCOUNTER — Other Ambulatory Visit: Payer: Self-pay | Admitting: Family Medicine

## 2014-09-04 NOTE — Telephone Encounter (Signed)
lipitor refill deneid- Patient NTBS for follow up and lab work

## 2014-09-04 NOTE — Telephone Encounter (Signed)
Last lipid/liver 09/2013

## 2014-09-13 ENCOUNTER — Telehealth: Payer: Self-pay | Admitting: Nurse Practitioner

## 2014-09-13 NOTE — Telephone Encounter (Signed)
Patient advised to call the practice that she normally gets her medication and who has seen her for her bipolar.

## 2014-09-19 ENCOUNTER — Emergency Department (HOSPITAL_COMMUNITY)
Admission: EM | Admit: 2014-09-19 | Discharge: 2014-09-19 | Disposition: A | Discharge status: Home / Self Care | Payer: Medicare Other | Attending: Emergency Medicine | Admitting: Emergency Medicine

## 2014-09-19 ENCOUNTER — Encounter (HOSPITAL_COMMUNITY): Payer: Self-pay

## 2014-09-19 DIAGNOSIS — F410 Panic disorder [episodic paroxysmal anxiety] without agoraphobia: Secondary | ICD-10-CM | POA: Insufficient documentation

## 2014-09-19 DIAGNOSIS — K219 Gastro-esophageal reflux disease without esophagitis: Secondary | ICD-10-CM | POA: Diagnosis not present

## 2014-09-19 DIAGNOSIS — R062 Wheezing: Secondary | ICD-10-CM | POA: Insufficient documentation

## 2014-09-19 DIAGNOSIS — R197 Diarrhea, unspecified: Secondary | ICD-10-CM | POA: Insufficient documentation

## 2014-09-19 DIAGNOSIS — R634 Abnormal weight loss: Secondary | ICD-10-CM | POA: Diagnosis not present

## 2014-09-19 DIAGNOSIS — Z720 Tobacco use: Secondary | ICD-10-CM | POA: Insufficient documentation

## 2014-09-19 DIAGNOSIS — R112 Nausea with vomiting, unspecified: Secondary | ICD-10-CM | POA: Diagnosis not present

## 2014-09-19 DIAGNOSIS — E785 Hyperlipidemia, unspecified: Secondary | ICD-10-CM | POA: Diagnosis not present

## 2014-09-19 DIAGNOSIS — R630 Anorexia: Secondary | ICD-10-CM | POA: Diagnosis not present

## 2014-09-19 DIAGNOSIS — Z79899 Other long term (current) drug therapy: Secondary | ICD-10-CM | POA: Diagnosis not present

## 2014-09-19 DIAGNOSIS — Z8619 Personal history of other infectious and parasitic diseases: Secondary | ICD-10-CM | POA: Insufficient documentation

## 2014-09-19 DIAGNOSIS — Z973 Presence of spectacles and contact lenses: Secondary | ICD-10-CM | POA: Insufficient documentation

## 2014-09-19 DIAGNOSIS — Z8742 Personal history of other diseases of the female genital tract: Secondary | ICD-10-CM | POA: Insufficient documentation

## 2014-09-19 DIAGNOSIS — F319 Bipolar disorder, unspecified: Secondary | ICD-10-CM | POA: Diagnosis not present

## 2014-09-19 LAB — CBC WITH DIFFERENTIAL/PLATELET
BASOS ABS: 0 10*3/uL (ref 0.0–0.1)
Basophils Relative: 0 % (ref 0–1)
EOS ABS: 0.1 10*3/uL (ref 0.0–0.7)
Eosinophils Relative: 1 % (ref 0–5)
HEMATOCRIT: 40.9 % (ref 36.0–46.0)
HEMOGLOBIN: 14 g/dL (ref 12.0–15.0)
Lymphocytes Relative: 20 % (ref 12–46)
Lymphs Abs: 2.2 10*3/uL (ref 0.7–4.0)
MCH: 32.9 pg (ref 26.0–34.0)
MCHC: 34.2 g/dL (ref 30.0–36.0)
MCV: 96.2 fL (ref 78.0–100.0)
MONO ABS: 0.5 10*3/uL (ref 0.1–1.0)
MONOS PCT: 4 % (ref 3–12)
NEUTROS ABS: 8 10*3/uL — AB (ref 1.7–7.7)
NEUTROS PCT: 75 % (ref 43–77)
Platelets: 243 10*3/uL (ref 150–400)
RBC: 4.25 MIL/uL (ref 3.87–5.11)
RDW: 16.2 % — ABNORMAL HIGH (ref 11.5–15.5)
WBC: 10.8 10*3/uL — ABNORMAL HIGH (ref 4.0–10.5)

## 2014-09-19 LAB — COMPREHENSIVE METABOLIC PANEL
ALT: 11 U/L (ref 0–35)
ANION GAP: 12 (ref 5–15)
AST: 14 U/L (ref 0–37)
Albumin: 3.9 g/dL (ref 3.5–5.2)
Alkaline Phosphatase: 58 U/L (ref 39–117)
BUN: 11 mg/dL (ref 6–23)
CO2: 23 mEq/L (ref 19–32)
CREATININE: 0.92 mg/dL (ref 0.50–1.10)
Calcium: 9.4 mg/dL (ref 8.4–10.5)
Chloride: 105 mEq/L (ref 96–112)
GFR calc Af Amer: 87 mL/min — ABNORMAL LOW (ref >90)
GFR calc non Af Amer: 75 mL/min — ABNORMAL LOW (ref >90)
Glucose, Bld: 126 mg/dL — ABNORMAL HIGH (ref 70–99)
Potassium: 4.3 mEq/L (ref 3.7–5.3)
Sodium: 140 mEq/L (ref 137–147)
TOTAL PROTEIN: 7 g/dL (ref 6.0–8.3)
Total Bilirubin: 0.3 mg/dL (ref 0.3–1.2)

## 2014-09-19 LAB — LIPASE, BLOOD: Lipase: 28 U/L (ref 11–59)

## 2014-09-19 MED ORDER — ONDANSETRON 8 MG PO TBDP: 8.0000 mg | ORAL_TABLET | Freq: Three times a day (TID) | ORAL | Status: DC | PRN

## 2014-09-19 MED ORDER — SODIUM CHLORIDE 0.9 % IV BOLUS (SEPSIS)
1000.0000 mL | Freq: Once | INTRAVENOUS | Status: AC
  Administered 2014-09-19: 1000 mL via INTRAVENOUS

## 2014-09-19 NOTE — ED Provider Notes (Signed)
CSN: [id number]     Arrival date & time 09/19/14  1410 History  This chart was scribed for [employer]. Alvino Chapel, MD by Tula Nakayama, ED Scribe. This patient was seen in room APA09/APA09 and the patient's care was started at 2:33 PM.    Chief Complaint  Patient presents with  . Emesis  . Diarrhea   The history is provided by the patient. No language interpreter was used.    HPI Comments: Margaret Barker is a 43 y.o. female who presents to the Emergency Department complaining of intermittent nausea and diarrhea that started 4 months ago. Pt states burning abdominal pain, CP that occurs after vomiting, loss of appetite and 40 lbs weightloss since the onset of symptoms. She notes symptoms started as diarrhea, nausea and vomiting after food intake. She has seen PCP and GI who think that Nexium caused nausea. They changed medication to Omeprazole with no relief. Pt denies recent international travel. She states she has been camping, but denies taking any trips prior to the onset of symptoms. She also denies positive sick contact. Pt smokes cigarettes. Pt reports that she has been on Bactrim, Sulfa and Amoxicillin for symptoms. She denies blood in stool and hematemesis as associated symptoms.   [place]  Past Medical History  Diagnosis Date  . Alcoholism   . Bipolar disorder   . Anxiety   . Depression   . Panic attacks   . Frequency of urination   . SUI (stress urinary incontinence, female)   . Nocturia   . Hyperlipidemia   . History of panic attacks   . GERD (gastroesophageal reflux disease)   . Helicobacter pylori gastritis     DX  04-13-2014-- TREATED WITH ANTBIOTICS  . Esophagitis   . Erosion of vaginal wall due to surgical mesh   . Wears glasses    Past Surgical History  Procedure Laterality Date  . Sacrospinus anterior culposuspension with uphold mesh and solyx single incision transurethral sling  08-09-2009 DR TANNENBAUM    STRESS INCONTINENCE W/ PELVIC  FLOOR PROLAPSE  . Left breast excisional bx  03-26-2007    BENIGN  . Laparoscopic assisted vaginal hysterectomy  10-22-2006  . Right excisional breast bx  10-23-2005    BENIGN  . Cholecystectomy  1992  . D & c hysteroscopy w/ polyp removal  04-22-2006  . Lesion removal  07/09/2012    Procedure: EXCISION VAGINAL LESION;  Surgeon: Ailene Rud, MD;  Location[facility];  Service: Urology;  Laterality: N/A;  Excision of Vaginal apical cyst  . Tubal ligation    . Pubovaginal sling N/A 04/24/2014    Procedure: EXCISE OF VAGINAL APICAL MESH EXTRUSION;  Surgeon: Ailene Rud, MD;  Location: [facility];  Service: Urology;  Laterality: N/A;  . Cystoscopy N/A 04/24/2014    Procedure: CYSTOSCOPY;  Surgeon: Ailene Rud, MD;  Location: [facility];  Service: Urology;  Laterality: N/A;  . Abdominal hysterectomy     Family History  Problem Relation Age of Onset  . Diabetes Mother   . Heart disease Father   . COPD Father   . Hypertension Father   . Cervical cancer Paternal Grandmother   . Colon cancer Paternal Uncle   . Liver cancer Paternal Uncle    History  Substance Use Topics  . Smoking status: Current Every Day Smoker -- 0.50 packs/day for 24 years    Types: Cigarettes  . Smokeless tobacco: Never Used  .  Alcohol Use: No   OB History    No data available     Review of Systems  Constitutional: Positive for appetite change.  Gastrointestinal: Positive for nausea, vomiting and diarrhea. Negative for blood in stool.  All other systems reviewed and are negative.  Allergies  Enablex; Naprosyn; and Neurontin  Home Medications   Prior to Admission medications   Medication Sig Start Date End Date Taking? Authorizing Provider  atorvastatin (LIPITOR) 80 MG tablet Take 80 mg by mouth daily.   Yes Historical Provider, MD  clarithromycin (BIAXIN) 500 MG tablet Take 500 mg by mouth 2 (two) times daily. 10 day course  starting on 09/18/2014 09/18/14  Yes Historical Provider, MD  clonazePAM (KLONOPIN) 1 MG tablet Take 1 mg by mouth 3 (three) times daily.   Yes Historical Provider, MD  escitalopram (LEXAPRO) 20 MG tablet Take 20 mg by mouth every morning.    Yes Historical Provider, MD  Probiotic Product (PROBIOTIC DAILY PO) Take 1 capsule by mouth daily.   Yes Historical Provider, MD  ranitidine (ZANTAC) 150 MG tablet Take 1 tablet (150 mg total) by mouth 2 (two) times daily. 07/20/14  Yes Inda Castle, MD  solifenacin (VESICARE) 10 MG tablet Take 10 mg by mouth every morning.    Yes Historical Provider, MD  traZODone (DESYREL) 150 MG tablet Take 150 mg by mouth at bedtime. 08/28/14  Yes Historical Provider, MD  valACYclovir (VALTREX) 1000 MG tablet Take 1,000 mg by mouth 2 (two) times daily.   Yes Historical Provider, MD  dexamethasone (DECADRON) 4 MG tablet 1 po bid with food Patient not taking: Reported on 09/19/2014 08/07/14   Lenox Ahr, PA-C  hyoscyamine (LEVSIN SL) 0.125 MG SL tablet Place 2 tablets (0.25 mg total) under the tongue every 4 (four) hours as needed. Patient not taking: Reported on 09/19/2014 07/20/14   Inda Castle, MD  ondansetron (ZOFRAN-ODT) 8 MG disintegrating tablet Take 1 tablet (8 mg total) by mouth every 8 (eight) hours as needed for nausea or vomiting. 09/19/14   Jasper Riling. Tamari Redwine, MD  sulfamethoxazole-trimethoprim (BACTRIM) 400-80 MG per tablet Take 1 tablet by mouth 2 (two) times daily. Patient not taking: Reported on 09/19/2014 08/16/14   Mary-Margaret Hassell Done, FNP  traMADol Veatrice Bourbon) 50 MG tablet 1 or 2 po q6h prn pain Patient not taking: Reported on 09/19/2014 08/07/14   Lenox Ahr, PA-C   BP 144/87 mmHg  Pulse 61  Temp(Src) 98 F (36.7 C) (Oral)  Resp 20  Ht 5\' 2"  (1.575 m)  Wt 120 lb (54.432 kg)  BMI 21.94 kg/m2  SpO2 99% Physical Exam  Constitutional: She appears well-developed and well-nourished. No distress.  Slender  HENT:  Head: Normocephalic and  atraumatic.  Mouth slightly dry  Eyes: Conjunctivae and EOM are normal.  Neck: Neck supple. No tracheal deviation present.  Cardiovascular: Normal rate, regular rhythm and normal heart sounds.   No murmur heard. Pulmonary/Chest: Effort normal. No respiratory distress. She has wheezes.  Wheezes in right upper chest  Abdominal: There is no tenderness.  Musculoskeletal: She exhibits no edema.  No peripheral edema  Skin: Skin is warm and dry.  Psychiatric: She has a normal mood and affect. Her behavior is normal.  Nursing note and vitals reviewed.   ED Course  Procedures (including critical care time)   COORDINATION OF CARE: 2:39 PM Discussed treatment plan with pt which includes lab work and stool sample. Pt agreed to plan.   Labs Review Labs Reviewed  CBC  WITH DIFFERENTIAL - Abnormal; Notable for the following:    WBC 10.8 (*)    RDW 16.2 (*)    Neutro Abs 8.0 (*)    All other components within normal limits  COMPREHENSIVE METABOLIC PANEL - Abnormal; Notable for the following:    Glucose, Bld 126 (*)    GFR calc non Af Amer 75 (*)    GFR calc Af Amer 87 (*)    All other components within normal limits  LIPASE, BLOOD  GI PATHOGEN PANEL BY PCR, STOOL    Imaging Review No results found.   EKG Interpretation None      MDM   Final diagnoses:  Diarrhea    Patient with nausea vomiting some diarrhea over the last months. Has lost 40 pounds. Has been seen by gastroenterology for the same. Lab work overall reassuring here. Stool samples ordered, however with her 2-3 hour stay in the ER she was unable to have any more diarrhea. Patient was eager to be discharged and was discharged home. Will follow with GI.    I personally performed the services described in this documentation, which was scribed in my presence. The recorded information has been reviewed and is accurate.      Jasper Riling. Alvino Chapel, MD 09/19/14 [phone]

## 2014-09-19 NOTE — Discharge Instructions (Signed)
Chronic Diarrhea  Diarrhea is frequent loose and watery bowel movements. It can cause you to feel weak and dehydrated. Dehydration can cause you to become tired and thirsty and to have a dry mouth, decreased urination, and dark yellow urine. Diarrhea is a sign of another problem, most often an infection that will not last long. In most cases, diarrhea lasts 2-3 days. Diarrhea that lasts longer than 4 weeks is called long-lasting (chronic) diarrhea. It is important to treat your diarrhea as directed by your health care provider to lessen or prevent future episodes of diarrhea.   CAUSES   There are many causes of chronic diarrhea. The following are some possible causes:   · Gastrointestinal infections caused by viruses, bacteria, or parasites.    · Food poisoning or food allergies.    · Certain medicines, such as antibiotics, chemotherapy, and laxatives.    · Artificial sweeteners and fructose.    · Digestive disorders, such as celiac disease and inflammatory bowel diseases.    · Irritable bowel syndrome.  · Some disorders of the pancreas.  · Disorders of the thyroid.  · Reduced blood flow to the intestines.  · Cancer.  Sometimes the cause of chronic diarrhea is unknown.  RISK FACTORS  · Having a severely weakened immune system, such as from HIV or AIDS.    · Taking certain types of cancer-fighting drugs (such as with chemotherapy) or other medicines.    · Having had a recent organ transplant.    · Having a portion of the stomach or small bowel removed.    · Traveling to countries where food and water supplies are often contaminated.    SYMPTOMS   In addition to frequent, loose stools, diarrhea may cause:   · Cramping.    · Abdominal pain.    · Nausea.    · Fever.  · Fatigue.  · Urgent need to use the bathroom.  · Loss of bowel control.  DIAGNOSIS   Your health care provider must take a careful history and perform a physical exam. Tests given are based on your symptoms and history. Tests may include:   · Blood or  stool tests. Three or more stool samples may be examined. Stool cultures may be used to test for bacteria or parasites.    · X-rays.    · A procedure in which a thin tube is inserted into the mouth or rectum (endoscopy). This allows the health care provider to look inside the intestine.    TREATMENT   · Treatment is aimed at correcting the cause of the diarrhea when possible.  · Diarrhea caused by an infection can often be treated with antibiotic medicines.  · Diarrhea not caused by an infection may require you to take long-term medicine or have surgery. Specific treatment should be discussed with your health care provider.  · If the cause cannot be determined, treatment aims to relieve symptoms and prevent dehydration. Serious health problems can occur if you do not maintain proper fluid levels. Treatment may include:  ¨ Taking an oral rehydration solution (ORS).  ¨ Not drinking beverages that contain caffeine (such as tea, coffee, and soft drinks).  ¨ Not drinking alcohol.  ¨ Maintaining well-balanced nutrition to help you recover faster.  HOME CARE INSTRUCTIONS   · Drink enough fluids to keep urine clear or pale yellow. Drink 1 cup (8 oz) of fluid for each diarrhea episode. Avoid fluids that contain simple sugars, fruit juices, whole milk products, and sodas. Hydrate with an ORS. You may purchase the ORS or prepare it at home by mixing the   following ingredients together:  ¨  - tsp (1.7-3  mL) table salt.  ¨ ¾ tsp (3 ¾ mL) baking soda.  ¨  tsp (1.7 mL) salt substitute containing potassium chloride.  ¨ 1 tbsp (20 mL) sugar.  ¨ 4.2 c (1 L) of water.    · Certain foods and beverages may increase the speed at which food moves through the gastrointestinal (GI) tract. These foods and beverages should be avoided. They include:  ¨ Caffeinated and alcoholic beverages.  ¨ High-fiber foods, such as raw fruits and vegetables, nuts, seeds, and whole grain breads and cereals.  ¨ Foods and beverages sweetened with sugar  alcohols, such as xylitol, sorbitol, and mannitol.    · Some foods may be well tolerated and may help thicken stool. These include:  ¨ Starchy foods, such as rice, toast, pasta, low-sugar cereal, oatmeal, grits, baked potatoes, crackers, and bagels.  ¨ Bananas.  ¨ Applesauce.  · Add probiotic-rich foods to help increase healthy bacteria in the GI tract. These include yogurt and fermented milk products.  · Wash your hands well after each diarrhea episode.  · Only take over-the-counter or prescription medicines as directed by your health care provider.  · Take a warm bath to relieve any burning or pain from frequent diarrhea episodes.  SEEK MEDICAL CARE IF:   · You are not urinating as often.  · Your urine is a dark color.  · You become very tired or dizzy.  · You have severe pain in the abdomen or rectum.  · Your have blood or pus in your stools.  · Your stools look black and tarry.  SEEK IMMEDIATE MEDICAL CARE IF:   · You are unable to keep fluids down.  · You have persistent vomiting.  · You have blood in your stool.  · Your stools are black and tarry.  · You do not urinate in 6-8 hours, or there is only a small amount of very dark urine.  · You have abdominal pain that increases or localizes.  · You have weakness, dizziness, confusion, or lightheadedness.  · You have a severe headache.  · Your diarrhea gets worse or does not get better.  · You have a fever or persistent symptoms for more than 2-3 days.  · You have a fever and your symptoms suddenly get worse.  MAKE SURE YOU:   · Understand these instructions.  · Will watch your condition.  · Will get help right away if you are not doing well or get worse.  Document Released: 12/20/2003 Document Revised: 10/04/2013 Document Reviewed: 03/24/2013  Exit[employer]® Patient Information ©2015 [employer]. This information is not intended to replace advice given to you by your health care provider. Make sure you discuss any questions you have with your health care  provider.

## 2014-09-19 NOTE — ED Notes (Signed)
Pt reports v/d daily x 4 months.  Reports has lost 40 lbs over that time period.  Reports her doctor's haven't told her what is causing these symptoms, says "they just give me an antibiotic and send me on my way."  Pt says feels weak and has no energy.

## 2014-09-25 ENCOUNTER — Telehealth: Payer: Self-pay | Admitting: Family Medicine

## 2014-09-25 ENCOUNTER — Other Ambulatory Visit: Payer: Self-pay | Admitting: Family Medicine

## 2014-09-25 MED ORDER — FLUCONAZOLE 150 MG PO TABS: 150.0000 mg | ORAL_TABLET | Freq: Once | ORAL | Status: DC

## 2014-09-25 NOTE — Telephone Encounter (Signed)
Patient has a yeast infection from antibiotic and wants to know if we can call in something

## 2014-09-26 ENCOUNTER — Ambulatory Visit (INDEPENDENT_AMBULATORY_CARE_PROVIDER_SITE_OTHER): Payer: Medicare Other | Admitting: Physician Assistant

## 2014-09-26 ENCOUNTER — Encounter: Payer: Self-pay | Admitting: Physician Assistant

## 2014-09-26 VITALS — BP 90/62 | HR 84 | Ht 63.75 in | Wt 119.2 lb

## 2014-09-26 DIAGNOSIS — R112 Nausea with vomiting, unspecified: Secondary | ICD-10-CM

## 2014-09-26 DIAGNOSIS — R197 Diarrhea, unspecified: Secondary | ICD-10-CM

## 2014-09-26 DIAGNOSIS — R634 Abnormal weight loss: Secondary | ICD-10-CM | POA: Diagnosis not present

## 2014-09-26 DIAGNOSIS — R1031 Right lower quadrant pain: Secondary | ICD-10-CM

## 2014-09-26 MED ORDER — NA SULFATE-K SULFATE-MG SULF 17.5-3.13-1.6 GM/177ML PO SOLN: 1.0000 | Freq: Once | ORAL | Status: DC

## 2014-09-26 NOTE — Progress Notes (Signed)
Reviewed and agree with management. Robert D. Kaplan, M.D., FACG  

## 2014-09-26 NOTE — Progress Notes (Signed)
Patient ID: Margaret Barker, female   DOB: 1971/07/06, 43 y.o.   MRN: [id number]   Subjective:    Patient ID: Margaret Barker, female    DOB: 25-Feb-1971, 43 y.o.   MRN: [id number]  HPI Polyp is a pleasant 43 year old white female known to Dr. Deatra Ina. She's primary patient of Dr. Morrie Sheldon. She comes in today with persistent complaints of weight loss ,crampy abdominal pain and intermittent nausea and intermittent diarrhea. He says her appetite has been poor and she has lost a total of 32 pounds over this past year. She says her diarrhea is intermittent she may go for couple of days with no bowel movement and then have loose stools for a few days. She has ongoing crampy abdominal discomfort in her mid abdomen. She feels that she does have early satiety symptoms and her appetite has been poor. She says sometimes she has to force herself to eat and occasionally feels as if her food is "hanging" in her esophagus. Not noted any melena or hematochezia. She has not been on any new medications or had any changes in dosages no regular aspirin or NSAIDs. She does have family history of colitis in her father and sister and a paternal uncle who was just diagnosed with colon cancer in his 69s. She is status post cholecystectomy and hysterectomy. She had undergone EGD and colonoscopy in 2012 per Dr. Deatra Ina .EGD was normal and colonoscopy for diarrhea was unremarkable random biopsies were negative. Most recent labs done 09/19/2014 WBC of 10.8 hemoglobin of 14 albumin 3.9 remainder of see met negative. He had a CT of her chest done recently for follow-up of pulmonary nodule and this is stable. CT of the abdomen done in April 2015 showed some mild wall thickening of the cecum and stable pulmonary nodules.  Review of Systems Pertinent positive and negative review of systems were noted in the above HPI section.  All other review of systems was otherwise negative.  Outpatient Encounter Prescriptions as of 09/26/2014    Medication Sig  . atorvastatin (LIPITOR) 80 MG tablet Take 80 mg by mouth daily.  . clonazePAM (KLONOPIN) 1 MG tablet Take 1 mg by mouth 3 (three) times daily.  Marland Kitchen escitalopram (LEXAPRO) 20 MG tablet Take 20 mg by mouth every morning.   . fluconazole (DIFLUCAN) 150 MG tablet Take 1 tablet (150 mg total) by mouth once.  . Probiotic Product (PROBIOTIC DAILY PO) Take 1 capsule by mouth daily.  . ranitidine (ZANTAC) 150 MG tablet Take 1 tablet (150 mg total) by mouth 2 (two) times daily.  . solifenacin (VESICARE) 10 MG tablet Take 10 mg by mouth every morning.   . traZODone (DESYREL) 150 MG tablet Take 150 mg by mouth at bedtime.  . valACYclovir (VALTREX) 1000 MG tablet Take 1,000 mg by mouth 2 (two) times daily.  . [DISCONTINUED] clarithromycin (BIAXIN) 500 MG tablet Take 500 mg by mouth 2 (two) times daily. 10 day course starting on 09/18/2014  . [DISCONTINUED] dexamethasone (DECADRON) 4 MG tablet 1 po bid with food  . [DISCONTINUED] hyoscyamine (LEVSIN SL) 0.125 MG SL tablet Place 2 tablets (0.25 mg total) under the tongue every 4 (four) hours as needed.  . [DISCONTINUED] ondansetron (ZOFRAN-ODT) 8 MG disintegrating tablet Take 1 tablet (8 mg total) by mouth every 8 (eight) hours as needed for nausea or vomiting.  . [DISCONTINUED] sulfamethoxazole-trimethoprim (BACTRIM) 400-80 MG per tablet Take 1 tablet by mouth 2 (two) times daily.  . [DISCONTINUED] traMADol (ULTRAM) 50 MG tablet 1  or 2 po q6h prn pain  . Na Sulfate-K Sulfate-Mg Sulf SOLN Take 1 kit by mouth once.   Allergies  Allergen Reactions  . Enablex [Darifenacin Hydrobromide Er] Other (See Comments)    HEADACHES  . Naprosyn [Naproxen]   . Neurontin [Gabapentin] Other (See Comments)    SEVERE DEPRESSION   Patient Active Problem List   Diagnosis Date Noted  . Nausea 07/20/2014  . Prolapse of vaginal wall with midline cystocele 04/24/2014  . ABDOMINAL PAIN, GENERALIZED 11/18/2010  . [place], [place] 04/19/2009  . MIXED  INCONTINENCE URGE AND STRESS 03/21/2009  . DEPRESSION, MAJOR, RECURRENT 12/10/2006  . PANIC ATTACKS 12/10/2006  . POST TRAUMATIC STRESS DISORDER 12/10/2006  . GASTROESOPHAGEAL REFLUX, NO ESOPHAGITIS 12/10/2006   History   Social History  . Marital Status: Married    Spouse Name: N/A    Number of Children: 3  . Years of Education: N/A   Occupational History  . Not on file.   Social History Main Topics  . Smoking status: Current Every Day Smoker -- 0.50 packs/day for 24 years    Types: Cigarettes  . Smokeless tobacco: Never Used  . Alcohol Use: No  . Drug Use: Yes    Special: Marijuana     Comment: denies  . Sexual Activity: Yes    Birth Control/ Protection: Surgical   Other Topics Concern  . Not on file   Social History Narrative    Ms. Haze's family history includes COPD in her father; Cervical cancer in her paternal grandmother; Colitis in her father and sister; Colon cancer in her paternal uncle; Diabetes in her mother; Heart disease in her father; Hypertension in her father; Liver cancer in her paternal uncle.      Objective:    Filed Vitals:   09/26/14 0958  BP: 90/62  Pulse: 84    Physical Exam   well-developed thin white female in no acute distress height 5 foot 3 weight 119 this is down from a weight of 130 in October 2015 and a weight of 151 in January 2015. HEENT; nontraumatic normocephalic EOMI PERRLA sclera anicteric, Neck ;supple no JVD, Cardiovascular; regular rate and rhythm with S1-S2 no murmur or gallop, Pulmonary ;clear bilaterally, Abdomen; soft she has some mild tenderness in the epigastrium and also in the right lower quadrant is no palpable mass or hepatosplenomegaly bowel sounds are present, Rectal ;exam not done, Extremities; no clubbing cyanosis or edema skin warm and dry, Psych ;mood and affect appropriate    Assessment & Plan:   #42 43 year old female with significant weight loss of 30 pounds over the past 1 year, current BMI of 20. This  is been associated with decreased appetite intermittent nausea and crampy abdominal pain, right lower quadrant discomfort, and intermittent diarrhea. Etiology is not clear will need to rule out underlying malignancy or IBD #2 hyperlipidemia #3 history of PTSD and depression patient states all have been stable #4 status post cholecystectomy #5 positive family history of colon cancer in paternal uncle age 17  Plan; scheduled for CT of the abdomen and pelvis with contrast Schedule for colonoscopy and EGD with Dr. Deatra Ina, am concerned about right colon. Procedures discussed in detail with the patient and she is agreeable to proceed. She'll continue Zantac 150 mg twice daily for now.  Amy S Esterwood PA-C 09/26/2014

## 2014-09-26 NOTE — Patient Instructions (Signed)
You have been scheduled for an endoscopy and colonoscopy. Please follow the written instructions given to you at your visit today.  We have given you a sample of the Suprep for the colonoscopy. If you use inhalers (even only as needed), please bring them with you on the day of your procedure. Your physician has requested that you go to www.startemmi.com and enter the access code given to you at your visit today. This web site gives a general overview about your procedure. However, you should still follow specific instructions given to you by our office regarding your preparation for the procedure.   You have been scheduled for a CT scan of the abdomen and pelvis at [facility].  You are scheduled on 09-29-2014 at 1:00 PM . You should arrive at 12:45 PM  prior to your appointment time for registration. Please follow the written instructions below on the day of your exam:  WARNING: IF YOU ARE ALLERGIC TO IODINE/X-RAY DYE, PLEASE NOTIFY RADIOLOGY IMMEDIATELY AT [phone]! YOU WILL BE GIVEN A 13 HOUR PREMEDICATION PREP.  1) Do not eat or drink anything after  (4 hours prior to your test) 2) You have been given 2 bottles of oral contrast to drink. The solution may taste  better if refrigerated, but do NOT add ice or any other liquid to this solution. Shake  well before drinking.    Drink 1 bottle of contrast @ 11:00 am  (2 hours prior to your exam)  Drink 1 bottle of contrast @ 12:00 Noon (1 hour prior to your exam)  You may take any medications as prescribed with a small amount of water except for the following: Metformin, Glucophage, Glucovance, Avandamet, Riomet, Fortamet, Actoplus Met, Janumet, Glumetza or Metaglip. The above medications must be held the day of the exam AND 48 hours after the exam.  The purpose of you drinking the oral contrast is to aid in the visualization of your intestinal tract. The contrast solution may cause some diarrhea. Before your exam is started,  you will be given a small amount of fluid to drink. Depending on your individual set of symptoms, you may also receive an intravenous injection of x-ray contrast/dye. Plan on being at [facility] for 30 minutes or long, depending on the type of exam you are having performed.  If you have any questions regarding your exam or if you need to reschedule, you may call the CT department at [phone] between the hours of 8:00 am and 5:00 pm, Monday-Friday.  ________________________________________________________________________  

## 2014-09-29 ENCOUNTER — Ambulatory Visit (HOSPITAL_COMMUNITY)
Admission: RE | Admit: 2014-09-29 | Discharge: 2014-09-29 | Disposition: A | Discharge status: Home / Self Care | Payer: Medicare Other | Source: Ambulatory Visit | Attending: Nurse Practitioner | Admitting: Nurse Practitioner

## 2014-09-29 DIAGNOSIS — R1900 Intra-abdominal and pelvic swelling, mass and lump, unspecified site: Secondary | ICD-10-CM | POA: Diagnosis not present

## 2014-09-29 DIAGNOSIS — Z9049 Acquired absence of other specified parts of digestive tract: Secondary | ICD-10-CM | POA: Insufficient documentation

## 2014-09-29 DIAGNOSIS — R1031 Right lower quadrant pain: Secondary | ICD-10-CM

## 2014-09-29 DIAGNOSIS — R634 Abnormal weight loss: Secondary | ICD-10-CM | POA: Diagnosis not present

## 2014-09-29 DIAGNOSIS — R112 Nausea with vomiting, unspecified: Secondary | ICD-10-CM

## 2014-09-29 DIAGNOSIS — R197 Diarrhea, unspecified: Secondary | ICD-10-CM | POA: Diagnosis not present

## 2014-09-29 MED ORDER — IOHEXOL 300 MG/ML  SOLN
100.0000 mL | Freq: Once | INTRAMUSCULAR | Status: AC | PRN
  Administered 2014-09-29: 100 mL via INTRAVENOUS

## 2014-10-02 ENCOUNTER — Telehealth: Payer: Self-pay

## 2014-10-02 NOTE — Telephone Encounter (Signed)
Pt aware of results 

## 2014-10-02 NOTE — Telephone Encounter (Signed)
-----   Message from Chevis Pretty, [place] sent at 09/29/2014  4:07 PM EST ----- U/s negative

## 2014-10-03 ENCOUNTER — Encounter (HOSPITAL_COMMUNITY): Payer: Self-pay | Admitting: *Deleted

## 2014-10-23 ENCOUNTER — Ambulatory Visit (HOSPITAL_COMMUNITY): Payer: Medicare Other | Admitting: *Deleted

## 2014-10-23 ENCOUNTER — Encounter (HOSPITAL_COMMUNITY)
Admission: RE | Disposition: A | Discharge status: Home / Self Care | Payer: Self-pay | Source: Ambulatory Visit | Attending: Gastroenterology

## 2014-10-23 ENCOUNTER — Encounter (HOSPITAL_COMMUNITY): Payer: Self-pay | Admitting: Gastroenterology

## 2014-10-23 ENCOUNTER — Ambulatory Visit (HOSPITAL_COMMUNITY)
Admission: RE | Admit: 2014-10-23 | Discharge: 2014-10-23 | Disposition: A | Discharge status: Home / Self Care | Payer: Medicare Other | Source: Ambulatory Visit | Attending: Gastroenterology | Admitting: Gastroenterology

## 2014-10-23 DIAGNOSIS — K2960 Other gastritis without bleeding: Secondary | ICD-10-CM | POA: Diagnosis not present

## 2014-10-23 DIAGNOSIS — Z800 Family history of malignant neoplasm of digestive organs: Secondary | ICD-10-CM | POA: Insufficient documentation

## 2014-10-23 DIAGNOSIS — R1031 Right lower quadrant pain: Secondary | ICD-10-CM | POA: Diagnosis not present

## 2014-10-23 DIAGNOSIS — R110 Nausea: Secondary | ICD-10-CM | POA: Insufficient documentation

## 2014-10-23 DIAGNOSIS — F17210 Nicotine dependence, cigarettes, uncomplicated: Secondary | ICD-10-CM | POA: Insufficient documentation

## 2014-10-23 DIAGNOSIS — Z9049 Acquired absence of other specified parts of digestive tract: Secondary | ICD-10-CM | POA: Insufficient documentation

## 2014-10-23 DIAGNOSIS — E785 Hyperlipidemia, unspecified: Secondary | ICD-10-CM | POA: Diagnosis not present

## 2014-10-23 DIAGNOSIS — K648 Other hemorrhoids: Secondary | ICD-10-CM | POA: Diagnosis not present

## 2014-10-23 DIAGNOSIS — R1110 Vomiting, unspecified: Secondary | ICD-10-CM | POA: Diagnosis not present

## 2014-10-23 DIAGNOSIS — K2970 Gastritis, unspecified, without bleeding: Secondary | ICD-10-CM | POA: Diagnosis not present

## 2014-10-23 DIAGNOSIS — R634 Abnormal weight loss: Secondary | ICD-10-CM | POA: Diagnosis not present

## 2014-10-23 DIAGNOSIS — R112 Nausea with vomiting, unspecified: Secondary | ICD-10-CM | POA: Diagnosis not present

## 2014-10-23 DIAGNOSIS — F329 Major depressive disorder, single episode, unspecified: Secondary | ICD-10-CM | POA: Diagnosis not present

## 2014-10-23 DIAGNOSIS — Z79899 Other long term (current) drug therapy: Secondary | ICD-10-CM | POA: Diagnosis not present

## 2014-10-23 DIAGNOSIS — F319 Bipolar disorder, unspecified: Secondary | ICD-10-CM | POA: Insufficient documentation

## 2014-10-23 DIAGNOSIS — K319 Disease of stomach and duodenum, unspecified: Secondary | ICD-10-CM | POA: Diagnosis not present

## 2014-10-23 DIAGNOSIS — R197 Diarrhea, unspecified: Secondary | ICD-10-CM | POA: Diagnosis not present

## 2014-10-23 DIAGNOSIS — K219 Gastro-esophageal reflux disease without esophagitis: Secondary | ICD-10-CM | POA: Diagnosis not present

## 2014-10-23 DIAGNOSIS — F4310 Post-traumatic stress disorder, unspecified: Secondary | ICD-10-CM | POA: Insufficient documentation

## 2014-10-23 DIAGNOSIS — K2990 Gastroduodenitis, unspecified, without bleeding: Secondary | ICD-10-CM

## 2014-10-23 HISTORY — PX: ESOPHAGOGASTRODUODENOSCOPY (EGD) WITH PROPOFOL: SHX5813

## 2014-10-23 HISTORY — PX: COLONOSCOPY: SHX5424

## 2014-10-23 SURGERY — COLONOSCOPY
Anesthesia: Monitor Anesthesia Care

## 2014-10-23 MED ORDER — LIDOCAINE HCL (CARDIAC) 20 MG/ML IV SOLN
INTRAVENOUS | Status: DC | PRN
  Administered 2014-10-23: 50 mg via INTRAVENOUS

## 2014-10-23 MED ORDER — HYOSCYAMINE SULFATE ER 0.375 MG PO TBCR: EXTENDED_RELEASE_TABLET | ORAL | Status: DC

## 2014-10-23 MED ORDER — PROPOFOL 10 MG/ML IV BOLUS
INTRAVENOUS | Status: AC
  Filled 2014-10-23: qty 20

## 2014-10-23 MED ORDER — KETAMINE HCL 10 MG/ML IJ SOLN
INTRAMUSCULAR | Status: DC | PRN
  Administered 2014-10-23: 10 mg via INTRAVENOUS
  Administered 2014-10-23: 20 mg via INTRAVENOUS
  Administered 2014-10-23 (×2): 10 mg via INTRAVENOUS

## 2014-10-23 MED ORDER — SODIUM CHLORIDE 0.9 % IV SOLN: INTRAVENOUS | Status: DC

## 2014-10-23 MED ORDER — PROPOFOL INFUSION 10 MG/ML OPTIME
INTRAVENOUS | Status: DC | PRN
  Administered 2014-10-23: 200 ug/kg/min via INTRAVENOUS

## 2014-10-23 MED ORDER — LACTATED RINGERS IV SOLN
INTRAVENOUS | Status: DC
  Administered 2014-10-23: 1000 mL via INTRAVENOUS

## 2014-10-23 MED ORDER — METOCLOPRAMIDE HCL 5 MG/ML IJ SOLN
INTRAMUSCULAR | Status: DC | PRN
  Administered 2014-10-23: 10 mg via INTRAVENOUS

## 2014-10-23 MED ORDER — METOCLOPRAMIDE HCL 5 MG/ML IJ SOLN
INTRAMUSCULAR | Status: AC
  Filled 2014-10-23: qty 2

## 2014-10-23 MED ORDER — ONDANSETRON HCL 4 MG/2ML IJ SOLN
INTRAMUSCULAR | Status: DC | PRN
  Administered 2014-10-23: 4 mg via INTRAVENOUS

## 2014-10-23 MED ORDER — LIDOCAINE HCL (CARDIAC) 20 MG/ML IV SOLN
INTRAVENOUS | Status: AC
  Filled 2014-10-23: qty 5

## 2014-10-23 MED ORDER — KETAMINE HCL 10 MG/ML IJ SOLN
INTRAMUSCULAR | Status: AC
  Filled 2014-10-23: qty 1

## 2014-10-23 MED ORDER — PROPOFOL 10 MG/ML IV BOLUS
INTRAVENOUS | Status: DC | PRN
  Administered 2014-10-23: 30 mg via INTRAVENOUS
  Administered 2014-10-23: 20 mg via INTRAVENOUS

## 2014-10-23 MED ORDER — ONDANSETRON HCL 4 MG/2ML IJ SOLN
INTRAMUSCULAR | Status: AC
  Filled 2014-10-23: qty 2

## 2014-10-23 SURGICAL SUPPLY — 15 items

## 2014-10-23 NOTE — Interval H&P Note (Signed)
History and Physical Interval Note:  10/23/2014 12:43 PM  Margaret Barker  has presented today for surgery, with the diagnosis of Weight loss Nausea & vomiting RLQ abd pain diarrhea  The various methods of treatment have been discussed with the patient and family. After consideration of risks, benefits and other options for treatment, the patient has consented to  Procedure(s): COLONOSCOPY (N/A) ESOPHAGOGASTRODUODENOSCOPY (EGD) WITH PROPOFOL (N/A) as a surgical intervention .  The patient's history has been reviewed, patient examined, no change in status, stable for surgery.  I have reviewed the patient's chart and labs.  Questions were answered to the patient's satisfaction.    The recent H&P (dated *09/26/17 **) was reviewed, the patient was examined and there is no change in the patients condition since that H&P was completed.   Erskine Emery  10/23/2014, 12:43 PM    Erskine Emery

## 2014-10-23 NOTE — H&P (View-Only) (Signed)
Patient ID: Margaret Barker, female   DOB: 13-Oct-1971, 44 y.o.   MRN: [id number]   Subjective:    Patient ID: Margaret Barker, female    DOB: [birth year], 44 y.o.   MRN: [id number]  HPI Polyp is a pleasant 44 year old white female known to Dr. Deatra Barker. She's primary patient of Dr. Morrie Barker. She comes in today with persistent complaints of weight loss ,crampy abdominal pain and intermittent nausea and intermittent diarrhea. He says her appetite has been poor and she has lost a total of 32 pounds over this past year. She says her diarrhea is intermittent she may go for couple of days with no bowel movement and then have loose stools for a few days. She has ongoing crampy abdominal discomfort in her mid abdomen. She feels that she does have early satiety symptoms and her appetite has been poor. She says sometimes she has to force herself to eat and occasionally feels as if her food is "hanging" in her esophagus. Not noted any melena or hematochezia. She has not been on any new medications or had any changes in dosages no regular aspirin or NSAIDs. She does have family history of colitis in her father and sister and a paternal uncle who was just diagnosed with colon cancer in his 13s. She is status post cholecystectomy and hysterectomy. She had undergone EGD and colonoscopy in 2012 per Dr. Deatra Barker .EGD was normal and colonoscopy for diarrhea was unremarkable random biopsies were negative. Most recent labs done 09/19/2014 WBC of 10.8 hemoglobin of 14 albumin 3.9 remainder of see met negative. He had a CT of her chest done recently for follow-up of pulmonary nodule and this is stable. CT of the abdomen done in April 2015 showed some mild wall thickening of the cecum and stable pulmonary nodules.  Review of Systems Pertinent positive and negative review of systems were noted in the above HPI section.  All other review of systems was otherwise negative.  Outpatient Encounter Prescriptions as of 09/26/2014    Medication Sig  . atorvastatin (LIPITOR) 80 MG tablet Take 80 mg by mouth daily.  . clonazePAM (KLONOPIN) 1 MG tablet Take 1 mg by mouth 3 (three) times daily.  Marland Kitchen escitalopram (LEXAPRO) 20 MG tablet Take 20 mg by mouth every morning.   . fluconazole (DIFLUCAN) 150 MG tablet Take 1 tablet (150 mg total) by mouth once.  . Probiotic Product (PROBIOTIC DAILY PO) Take 1 capsule by mouth daily.  . ranitidine (ZANTAC) 150 MG tablet Take 1 tablet (150 mg total) by mouth 2 (two) times daily.  . solifenacin (VESICARE) 10 MG tablet Take 10 mg by mouth every morning.   . traZODone (DESYREL) 150 MG tablet Take 150 mg by mouth at bedtime.  . valACYclovir (VALTREX) 1000 MG tablet Take 1,000 mg by mouth 2 (two) times daily.  . [DISCONTINUED] clarithromycin (BIAXIN) 500 MG tablet Take 500 mg by mouth 2 (two) times daily. 10 day course starting on 09/18/2014  . [DISCONTINUED] dexamethasone (DECADRON) 4 MG tablet 1 po bid with food  . [DISCONTINUED] hyoscyamine (LEVSIN SL) 0.125 MG SL tablet Place 2 tablets (0.25 mg total) under the tongue every 4 (four) hours as needed.  . [DISCONTINUED] ondansetron (ZOFRAN-ODT) 8 MG disintegrating tablet Take 1 tablet (8 mg total) by mouth every 8 (eight) hours as needed for nausea or vomiting.  . [DISCONTINUED] sulfamethoxazole-trimethoprim (BACTRIM) 400-80 MG per tablet Take 1 tablet by mouth 2 (two) times daily.  . [DISCONTINUED] traMADol (ULTRAM) 50 MG tablet 1  or 2 po q6h prn pain  . Na Sulfate-K Sulfate-Mg Sulf SOLN Take 1 kit by mouth once.   Allergies  Allergen Reactions  . Enablex [Darifenacin Hydrobromide Er] Other (See Comments)    HEADACHES  . Naprosyn [Naproxen]   . Neurontin [Gabapentin] Other (See Comments)    SEVERE DEPRESSION   Patient Active Problem List   Diagnosis Date Noted  . Nausea 07/20/2014  . Prolapse of vaginal wall with midline cystocele 04/24/2014  . ABDOMINAL PAIN, GENERALIZED 11/18/2010  . [place], [place] 04/19/2009  . MIXED  INCONTINENCE URGE AND STRESS 03/21/2009  . DEPRESSION, MAJOR, RECURRENT 12/10/2006  . PANIC ATTACKS 12/10/2006  . POST TRAUMATIC STRESS DISORDER 12/10/2006  . GASTROESOPHAGEAL REFLUX, NO ESOPHAGITIS 12/10/2006   History   Social History  . Marital Status: Married    Spouse Name: N/A    Number of Children: 3  . Years of Education: N/A   Occupational History  . Not on file.   Social History Main Topics  . Smoking status: Current Every Day Smoker -- 0.50 packs/day for 24 years    Types: Cigarettes  . Smokeless tobacco: Never Used  . Alcohol Use: No  . Drug Use: Yes    Special: Marijuana     Comment: denies  . Sexual Activity: Yes    Birth Control/ Protection: Surgical   Other Topics Concern  . Not on file   Social History Narrative    [name]'s family history includes COPD in her father; Cervical cancer in her paternal grandmother; Colitis in her father and sister; Colon cancer in her paternal uncle; Diabetes in her mother; Heart disease in her father; Hypertension in her father; Liver cancer in her paternal uncle.      Objective:    Filed Vitals:   09/26/14 0958  BP: 90/62  Pulse: 84    Physical Exam   well-developed thin white female in no acute distress height 5 foot 3 weight 119 this is down from a weight of 130 in October 2015 and a weight of 151 in January 2015. HEENT; nontraumatic normocephalic EOMI PERRLA sclera anicteric, Neck ;supple no JVD, Cardiovascular; regular rate and rhythm with S1-S2 no murmur or gallop, Pulmonary ;clear bilaterally, Abdomen; soft she has some mild tenderness in the epigastrium and also in the right lower quadrant is no palpable mass or hepatosplenomegaly bowel sounds are present, Rectal ;exam not done, Extremities; no clubbing cyanosis or edema skin warm and dry, Psych ;mood and affect appropriate    Assessment & Plan:   #68 44 year old female with significant weight loss of 30 pounds over the past 1 year, current BMI of 20. This  is been associated with decreased appetite intermittent nausea and crampy abdominal pain, right lower quadrant discomfort, and intermittent diarrhea. Etiology is not clear will need to rule out underlying malignancy or IBD #2 hyperlipidemia #3 history of PTSD and depression patient states all have been stable #4 status post cholecystectomy #5 positive family history of colon cancer in paternal uncle age 87  Plan; scheduled for CT of the abdomen and pelvis with contrast Schedule for colonoscopy and EGD with Dr. Deatra Barker, am concerned about right colon. Procedures discussed in detail with the patient and she is agreeable to proceed. She'll continue Zantac 150 mg twice daily for now.  Amy S Esterwood PA-C 09/26/2014

## 2014-10-23 NOTE — Transfer of Care (Signed)
Immediate Anesthesia Transfer of Care Note  Patient: Margaret Barker  Procedure(s) Performed: Procedure(s): COLONOSCOPY (N/A) ESOPHAGOGASTRODUODENOSCOPY (EGD) WITH PROPOFOL (N/A)  Patient Location: [facility]  Anesthesia Type:MAC  Level of Consciousness: Patient easily awoken, sedated, comfortable, cooperative, following commands, responds to stimulation.   Airway & Oxygen Therapy: Patient spontaneously breathing, ventilating well, oxygen via simple oxygen mask.  Post-op Assessment: Report given to PACU RN, vital signs reviewed and stable, moving all extremities.   Post vital signs: Reviewed and stable.  Complications: No apparent anesthesia complications

## 2014-10-23 NOTE — Anesthesia Preprocedure Evaluation (Addendum)
Anesthesia Evaluation  Patient identified by MRN, date of birth, ID band Patient awake    Reviewed: Allergy & Precautions, H&P , NPO status , Patient's Chart, lab work & pertinent test results  Airway Mallampati: II  TM Distance: >3 FB Neck ROM: Full    Dental no notable dental hx. (+) Teeth Intact, Dental Advisory Given   Pulmonary Current Smoker, former smoker,  breath sounds clear to auscultation  Pulmonary exam normal       Cardiovascular Exercise Tolerance: Good negative cardio ROS  Rhythm:Regular Rate:Normal     Neuro/Psych PSYCHIATRIC DISORDERS Anxiety Depression Bipolar Disorder H/O anxiety d/o, bipolar d/o, alcoholism, panic attacksnegative neurological ROS  negative psych ROS   GI/Hepatic negative GI ROS, GERD-  Medicated,(+)     substance abuse  alcohol use,   Endo/Other  negative endocrine ROS  Renal/GU negative Renal ROS  negative genitourinary   Musculoskeletal negative musculoskeletal ROS (+)   Abdominal   Peds negative pediatric ROS (+)  Hematology negative hematology ROS (+)   Anesthesia Other Findings   Reproductive/Obstetrics negative OB ROS                           Anesthesia Physical Anesthesia Plan  ASA: II  Anesthesia Plan: MAC   Post-op Pain Management:    Induction:   Airway Management Planned:   Additional Equipment:   Intra-op Plan:   Post-operative Plan:   Informed Consent: I have reviewed the patients History and Physical, chart, labs and discussed the procedure including the risks, benefits and alternatives for the proposed anesthesia with the patient or authorized representative who has indicated his/her understanding and acceptance.   Dental Advisory Given  Plan Discussed with: CRNA and Surgeon  Anesthesia Plan Comments:         Anesthesia Quick Evaluation

## 2014-10-23 NOTE — Op Note (Signed)
[facility] [place], [address]   COLONOSCOPY PROCEDURE REPORT  PATIENT: Margaret Barker, Margaret Barker  MR#: [id number] BIRTHDATE: [birth year] , 43  yrs. old GENDER: female ENDOSCOPIST: Inda Castle, MD REFERRED PF:[id number] Laurance Flatten, M.D. PROCEDURE DATE:  10/23/2014 PROCEDURE:   Colonoscopy, diagnostic First Screening Colonoscopy - Avg.  risk and is 50 yrs.  old or older - No.  Prior Negative Screening - Now for repeat screening. Other: See Comments  History of Adenoma - Now for follow-up colonoscopy & has been > or = to 3 yrs.  N/A  Polyps Removed Today? No.  Recommend repeat exam, <10 yrs? No. ASA CLASS:   Class II INDICATIONS:abdominal pain in the lower right quadrant and unexplained diarrhea. MEDICATIONS: Monitored anesthesia care  DESCRIPTION OF PROCEDURE:   After the risks benefits and alternatives of the procedure were thoroughly explained, informed consent was obtained.  The digital rectal exam revealed no abnormalities of the rectum.   The Pentax Ped Colon [id number] endoscope was introduced through the anus and advanced to the cecum, which was identified by both the appendix and ileocecal valve. No adverse events experienced.   The quality of the prep was good, using MoviPrep excellent using Suprep  The instrument was then slowly withdrawn as the colon was fully examined.      COLON FINDINGS: A normal appearing cecum, ileocecal valve, and appendiceal orifice were identified.  The ascending, transverse, descending, sigmoid colon, and rectum appeared unremarkable. Retroflexed views revealed internal hemorrhoids. The time to cecum= .  Withdrawal time=7 minutes 0 seconds.  The scope was withdrawn and the procedure completed. COMPLICATIONS: There were no immediate complications.  ENDOSCOPIC IMPRESSION: 1.   Normal colonoscopy 2.   Internal hemorrhoids  No findings to explain intermittent diarrhea and abdominal pain  RECOMMENDATIONS: trial of hyomax as  needed  eSigned:  Inda Castle, MD 10/23/2014 1:23 PM   cc:   PATIENT NAME:  Breniya, Goertzen MR#: [id number]

## 2014-10-23 NOTE — Discharge Instructions (Signed)
Esophagogastroduodenoscopy °Care After °Refer to this sheet in the next few weeks. These instructions provide you with information on caring for yourself after your procedure. Your caregiver may also give you more specific instructions. Your treatment has been planned according to current medical practices, but problems sometimes occur. Call your caregiver if you have any problems or questions after your procedure.  °HOME CARE INSTRUCTIONS °· Do not eat or drink anything until the numbing medicine (local anesthetic) has worn off and your gag reflex has returned. You will know that the local anesthetic has worn off when you can swallow comfortably. °· Do not drive for 12 hours after the procedure or as directed by your caregiver. °· Only take medicines as directed by your caregiver. °SEEK MEDICAL CARE IF:  °· You cannot stop coughing. °· You are not urinating at all or less than usual. °SEEK IMMEDIATE MEDICAL CARE IF: °· You have difficulty swallowing. °· You cannot eat or drink. °· You have worsening throat or chest pain. °· You have dizziness, lightheadedness, or you faint. °· You have nausea or vomiting. °· You have chills. °· You have a fever. °· You have severe abdominal pain. °· You have black, tarry, or bloody stools. °Document Released: 09/15/2012 Document Reviewed: 09/15/2012 °ExitCare® Patient Information ©2015 [employer]. This information is not intended to replace advice given to you by your health care provider. Make sure you discuss any questions you have with your health care provider. ° °Colonoscopy, Care After °Refer to this sheet in the next few weeks. These instructions provide you with information on caring for yourself after your procedure. Your health care provider may also give you more specific instructions. Your treatment has been planned according to current medical practices, but problems sometimes occur. Call your health care provider if you have any problems or questions after your  procedure. °WHAT TO EXPECT AFTER THE PROCEDURE  °After your procedure, it is typical to have the following: °· A small amount of blood in your stool. °· Moderate amounts of gas and mild abdominal cramping or bloating. °HOME CARE INSTRUCTIONS °· Do not drive, operate machinery, or sign important documents for 24 hours. °· You may shower and resume your regular physical activities, but move at a slower pace for the first 24 hours. °· Take frequent rest periods for the first 24 hours. °· Walk around or put a warm pack on your abdomen to help reduce abdominal cramping and bloating. °· Drink enough fluids to keep your urine clear or pale yellow. °· You may resume your normal diet as instructed by your health care provider. Avoid heavy or fried foods that are hard to digest. °· Avoid drinking alcohol for 24 hours or as instructed by your health care provider. °· Only take over-the-counter or prescription medicines as directed by your health care provider. °· If a tissue sample (biopsy) was taken during your procedure: °¨ Do not take aspirin or blood thinners for 7 days, or as instructed by your health care provider. °¨ Do not drink alcohol for 7 days, or as instructed by your health care provider. °¨ Eat soft foods for the first 24 hours. °SEEK MEDICAL CARE IF: °You have persistent spotting of blood in your stool 2-3 days after the procedure. °SEEK IMMEDIATE MEDICAL CARE IF: °· You have more than a small spotting of blood in your stool. °· You pass large blood clots in your stool. °· Your abdomen is swollen (distended). °· You have nausea or vomiting. °· You have a   fever. °· You have increasing abdominal pain that is not relieved with medicine. °Document Released: 05/13/2004 Document Revised: 07/20/2013 Document Reviewed: 06/06/2013 °ExitCare® Patient Information ©2015 [employer]. This information is not intended to replace advice given to you by your health care provider. Make sure you discuss any questions you have  with your health care provider. ° ° ° °

## 2014-10-23 NOTE — Anesthesia Postprocedure Evaluation (Signed)
  Anesthesia Post-op Note  Patient: Margaret Barker  Procedure(s) Performed: Procedure(s) (LRB): COLONOSCOPY (N/A) ESOPHAGOGASTRODUODENOSCOPY (EGD) WITH PROPOFOL (N/A)  Patient Location: PACU  Anesthesia Type: MAC  Level of Consciousness: awake and alert   Airway and Oxygen Therapy: Patient Spontanous Breathing  Post-op Pain: mild  Post-op Assessment: Post-op Vital signs reviewed, Patient's Cardiovascular Status Stable, Respiratory Function Stable, Patent Airway and No signs of Nausea or vomiting  Last Vitals:  Filed Vitals:   10/23/14 1349  BP: 130/85  Pulse: 87  Temp:   Resp: 16    Post-op Vital Signs: stable   Complications: No apparent anesthesia complications

## 2014-10-23 NOTE — Op Note (Signed)
[facility] [place], [address]   ENDOSCOPY PROCEDURE REPORT  PATIENT: Margaret Barker, Margaret Barker  MR#: [id number] BIRTHDATE: October 14, 1970 , 43  yrs. old GENDER: female ENDOSCOPIST: Inda Castle, MD REFERRED BY:  Redge Gainer, M.D. PROCEDURE DATE:  10/23/2014 PROCEDURE:  EGD w/ biopsy ASA CLASS:     Class II INDICATIONS:  nausea and epigastric pain. MEDICATIONS: Monitored anesthesia care TOPICAL ANESTHETIC:  DESCRIPTION OF PROCEDURE: After the risks benefits and alternatives of the procedure were thoroughly explained, informed consent was obtained.  The Pentax Gastroscope [id number] endoscope was introduced through the mouth and advanced to the second portion of the duodenum , Without limitations.  The instrument was slowly withdrawn as the mucosa was fully examined.    There were multiple areas of streaky erythema in the gastric body and antrum.  Multiple biopsies were performed.   Otherwise normal EGD  Retroflexed views revealed no abnormalities.     The scope was then withdrawn from the patient and the procedure completed.  COMPLICATIONS: There were no immediate complications.  ENDOSCOPIC IMPRESSION: 1.   nonerosive gastritis 2.   Otherwise normal EGD  RECOMMENDATIONS: 1.  Await biopsy results 2.  My office will arrange for you to have a Gastric Emptying Scan performed.  This is a radiology test that gives an idea of how well your stomach functions. 3.  trial of hyomax as needed abdominal pain and nausea  REPEAT EXAM:  eSigned:  Inda Castle, MD 10/23/2014 1:27 PM    CC:

## 2014-10-24 ENCOUNTER — Encounter (HOSPITAL_COMMUNITY): Payer: Self-pay | Admitting: Gastroenterology

## 2014-10-24 ENCOUNTER — Encounter: Payer: Self-pay | Admitting: Gastroenterology

## 2014-10-24 DIAGNOSIS — F329 Major depressive disorder, single episode, unspecified: Secondary | ICD-10-CM | POA: Diagnosis not present

## 2014-10-26 ENCOUNTER — Telehealth: Payer: Self-pay

## 2014-10-26 ENCOUNTER — Other Ambulatory Visit: Payer: Self-pay

## 2014-10-26 DIAGNOSIS — R1112 Projectile vomiting: Secondary | ICD-10-CM

## 2014-10-26 DIAGNOSIS — R1013 Epigastric pain: Secondary | ICD-10-CM

## 2014-10-26 NOTE — Telephone Encounter (Signed)
Patient advised of the GES on 10/30/14. Procedure test explained briefly. She agrees with the plan.

## 2014-10-26 NOTE — Telephone Encounter (Signed)
EGD on 10/23/14. Per Dr Deatra Ina, GES needed. Left message for the patient to return my call. She is now scheduled for GES on 10/30/14 arrive at 9:15 fasting. Hold all stomach medication for 24 hours before the test.

## 2014-10-30 ENCOUNTER — Ambulatory Visit (HOSPITAL_COMMUNITY): Payer: Medicare Other

## 2014-10-31 ENCOUNTER — Encounter (HOSPITAL_COMMUNITY)
Admission: RE | Admit: 2014-10-31 | Discharge: 2014-10-31 | Disposition: A | Discharge status: Home / Self Care | Payer: Medicare Other | Source: Ambulatory Visit | Attending: Gastroenterology | Admitting: Gastroenterology

## 2014-10-31 DIAGNOSIS — R1013 Epigastric pain: Secondary | ICD-10-CM

## 2014-10-31 DIAGNOSIS — R1112 Projectile vomiting: Secondary | ICD-10-CM

## 2014-11-09 ENCOUNTER — Encounter (HOSPITAL_COMMUNITY)
Admission: RE | Admit: 2014-11-09 | Discharge: 2014-11-09 | Disposition: A | Discharge status: Home / Self Care | Payer: Medicare Other | Source: Ambulatory Visit | Attending: Gastroenterology | Admitting: Gastroenterology

## 2014-11-09 DIAGNOSIS — R1013 Epigastric pain: Secondary | ICD-10-CM | POA: Insufficient documentation

## 2014-11-09 DIAGNOSIS — R1112 Projectile vomiting: Secondary | ICD-10-CM | POA: Insufficient documentation

## 2014-11-09 DIAGNOSIS — R109 Unspecified abdominal pain: Secondary | ICD-10-CM | POA: Diagnosis not present

## 2014-11-09 DIAGNOSIS — R112 Nausea with vomiting, unspecified: Secondary | ICD-10-CM | POA: Diagnosis not present

## 2014-11-09 MED ORDER — TECHNETIUM TC 99M SULFUR COLLOID: 2.1000 | Freq: Once | INTRAVENOUS | Status: AC | PRN

## 2014-11-09 NOTE — Progress Notes (Signed)
Quick Note:  Please inform the patient that the gastric emptying scan was normal. She needs a follow-up office visit ______

## 2014-11-16 ENCOUNTER — Other Ambulatory Visit: Payer: Self-pay | Admitting: Family Medicine

## 2014-11-17 NOTE — Telephone Encounter (Signed)
Last seen 08/16/14 MMM

## 2014-11-21 ENCOUNTER — Ambulatory Visit: Payer: Medicare Other | Admitting: Family Medicine

## 2014-11-24 ENCOUNTER — Ambulatory Visit (INDEPENDENT_AMBULATORY_CARE_PROVIDER_SITE_OTHER): Payer: Medicare Other | Admitting: Family Medicine

## 2014-11-24 ENCOUNTER — Encounter: Payer: Self-pay | Admitting: Family Medicine

## 2014-11-24 VITALS — BP 111/71 | HR 97 | Temp 96.4°F | Ht 62.0 in | Wt 128.4 lb

## 2014-11-24 DIAGNOSIS — J208 Acute bronchitis due to other specified organisms: Secondary | ICD-10-CM

## 2014-11-24 MED ORDER — FLUCONAZOLE 150 MG PO TABS: 150.0000 mg | ORAL_TABLET | Freq: Once | ORAL | Status: DC

## 2014-11-24 MED ORDER — AMOXICILLIN 875 MG PO TABS: 875.0000 mg | ORAL_TABLET | Freq: Two times a day (BID) | ORAL | Status: DC

## 2014-11-24 NOTE — Progress Notes (Signed)
   Subjective:    Patient ID: NISSI DOFFING, female    DOB: September 07, 1971, 44 y.o.   MRN: [id number]  HPI Patient is here for c/o uri sx's   Review of Systems    No chest pain, SOB, HA, dizziness, vision change, N/V, diarrhea, constipation, dysuria, urinary urgency or frequency, myalgias, arthralgias or rash.  Objective:   Physical Exam  Vital signs noted  Well developed well nourished female.  HEENT - Head atraumatic Normocephalic                Eyes - PERRLA, Conjuctiva - clear Sclera- Clear EOMI                Ears - EAC's Wnl TM's Wnl Gross Hearing WNL                Nose - Nares patent                 Throat - oropharanx wnl Respiratory - Lungs CTA bilateral Cardiac - RRR S1 and S2 without murmur GI - Abdomen soft Nontender and bowel sounds active x 4 Extremities - No edema. Neuro - Grossly intact.      Assessment & Plan:  Acute bronchitis due to other specified organisms - Plan: fluconazole (DIFLUCAN) 150 MG tablet, amoxicillin (AMOXIL) 875 MG tablet  Push po fluids, rest, tylenol and motrin otc prn as directed for fever, arthralgias, and myalgias.  Follow up prn if sx's continue or persist.  Lysbeth Penner FNP

## 2014-12-05 DIAGNOSIS — F329 Major depressive disorder, single episode, unspecified: Secondary | ICD-10-CM | POA: Diagnosis not present

## 2014-12-11 ENCOUNTER — Other Ambulatory Visit: Payer: Self-pay | Admitting: Gastroenterology

## 2014-12-12 DIAGNOSIS — F411 Generalized anxiety disorder: Secondary | ICD-10-CM | POA: Diagnosis not present

## 2015-01-01 ENCOUNTER — Telehealth: Payer: Self-pay | Admitting: Family Medicine

## 2015-01-01 NOTE — Telephone Encounter (Signed)
Patient offered appointment with [facility] tomorrow but states she will go to urgent care today

## 2015-01-20 ENCOUNTER — Ambulatory Visit (INDEPENDENT_AMBULATORY_CARE_PROVIDER_SITE_OTHER): Payer: Medicare Other | Admitting: Family Medicine

## 2015-01-20 VITALS — BP 103/76 | HR 80 | Temp 97.7°F | Ht 62.0 in | Wt 136.4 lb

## 2015-01-20 DIAGNOSIS — M5431 Sciatica, right side: Secondary | ICD-10-CM

## 2015-01-20 MED ORDER — PREDNISONE 10 MG PO TABS: ORAL_TABLET | ORAL | Status: DC

## 2015-01-20 NOTE — Progress Notes (Signed)
Subjective:  Patient ID: JOEI FRANGOS, female    DOB: 01-Oct-1971  Age: 44 y.o. MRN: [id number]  CC: Back Pain   HPI Margaret Barker presents for  3 wks right buttocks pain going around and down thigh. NKI  7/10 severity History Margaret Barker has a past medical history of Alcoholism; Bipolar disorder; Anxiety; Depression; Panic attacks; Frequency of urination; SUI (stress urinary incontinence, female); Nocturia; Hyperlipidemia; History of panic attacks; GERD (gastroesophageal reflux disease); Helicobacter pylori gastritis; Esophagitis; Erosion of vaginal wall due to surgical mesh; and Wears glasses.   She has past surgical history that includes [place] (08-09-2009 DR Gaynelle Arabian); LEFT BREAST EXCISIONAL BX (03-26-2007); Laparoscopic assisted vaginal hysterectomy (10-22-2006); RIGHT EXCISIONAL BREAST BX (10-23-2005); Cholecystectomy (1992); D & C HYSTEROSCOPY W/ POLYP REMOVAL (04-22-2006); Lesion removal (07/09/2012); Tubal ligation; Pubovaginal sling (N/A, 04/24/2014); Cystoscopy (N/A, 04/24/2014); Abdominal hysterectomy; Colonoscopy (N/A, 10/23/2014); and Esophagogastroduodenoscopy (egd) with propofol (N/A, 10/23/2014).   Her family history includes COPD in her father; Cervical cancer in her paternal grandmother; Colitis in her father and sister; Colon cancer in her paternal uncle; Diabetes in her mother; Heart disease in her father; Hypertension in her father; Liver cancer in her paternal uncle.She reports that she has been smoking Cigarettes.  She has a 12 pack-year smoking history. She has never used smokeless tobacco. She reports that she uses illicit drugs (Marijuana). She reports that she does not drink alcohol.  Current Outpatient Prescriptions on File Prior to Visit  Medication Sig Dispense Refill  . acetaminophen (TYLENOL) 500 MG tablet Take 1,000 mg by mouth every 6 (six) hours as needed for moderate pain or  headache.    . busPIRone (BUSPAR) 10 MG tablet Take 10 mg by mouth 2 (two) times daily.    . DULoxetine (CYMBALTA) 60 MG capsule Take 60 mg by mouth every morning.    Marland Kitchen QUEtiapine (SEROQUEL) 100 MG tablet Take 100 mg by mouth at bedtime.    . ranitidine (ZANTAC) 150 MG tablet TAKE ONE TABLET BY MOUTH TWICE DAILY 60 tablet 2  . solifenacin (VESICARE) 10 MG tablet Take 10 mg by mouth every morning.     . valACYclovir (VALTREX) 1000 MG tablet TAKE ONE TABLET BY MOUTH TWICE DAILY 60 tablet 2   Current Facility-Administered Medications on File Prior to Visit  Medication Dose Route Frequency Provider Last Rate Last Dose  . bupivacaine-EPINEPHrine (MARCAINE W/ EPI) 0.25 % (with pres) injection 20 mL  20 mL Infiltration Once Carolan Clines, MD        ROS Review of Systems  Constitutional: Negative for fever, chills, diaphoresis, appetite change, fatigue and unexpected weight change.  HENT: Negative for congestion, ear pain, hearing loss, postnasal drip, rhinorrhea, sneezing, sore throat and trouble swallowing.   Eyes: Negative for pain.  Respiratory: Negative for cough, chest tightness and shortness of breath.   Cardiovascular: Negative for chest pain and palpitations.  Gastrointestinal: Negative for nausea, vomiting, abdominal pain, diarrhea and constipation.  Genitourinary: Negative for dysuria, frequency and menstrual problem.  Musculoskeletal: Positive for myalgias, back pain and arthralgias. Negative for joint swelling.  Skin: Negative for rash.  Neurological: Negative for dizziness, weakness, numbness and headaches.  Psychiatric/Behavioral: Negative for dysphoric mood and agitation.    Objective:  BP 103/76 mmHg  Pulse 80  Temp(Src) 97.7 F (36.5 C) (Oral)  Ht 5\' 2"  (1.575 m)  Wt 136 lb 6.4 oz (61.871 kg)  BMI 24.94 kg/m2  BP Readings from Last 3  Encounters:  01/20/15 103/76  11/24/14 111/71  10/23/14 130/85    Wt Readings from Last 3 Encounters:  01/20/15 136 lb 6.4 oz  (61.871 kg)  11/24/14 128 lb 6.4 oz (58.242 kg)  10/23/14 115 lb (52.164 kg)     Physical Exam  Constitutional: She is oriented to person, place, and time. She appears well-developed and well-nourished. No distress.  HENT:  Head: Normocephalic and atraumatic.  Right Ear: External ear normal.  Left Ear: External ear normal.  Nose: Nose normal.  Mouth/Throat: Oropharynx is clear and moist.  Eyes: Conjunctivae and EOM are normal. Pupils are equal, round, and reactive to light.  Neck: Normal range of motion. Neck supple. No thyromegaly present.  Cardiovascular: Normal rate, regular rhythm and normal heart sounds.   No murmur heard. Pulmonary/Chest: Effort normal and breath sounds normal. No respiratory distress. She has no wheezes. She has no rales.  Abdominal: Soft. Bowel sounds are normal. She exhibits no distension. There is no tenderness.  Lymphadenopathy:    She has no cervical adenopathy.  Neurological: She is alert and oriented to person, place, and time. She has normal reflexes.  Skin: Skin is warm and dry.  Psychiatric: She has a normal mood and affect. Her behavior is normal. Judgment and thought content normal.    No results found for: HGBA1C  Lab Results  Component Value Date   WBC 10.8* 09/19/2014   HGB 14.0 09/19/2014   HCT 40.9 09/19/2014   PLT 243 09/19/2014   GLUCOSE 126* 09/19/2014   CHOL 171 09/16/2013   TRIG 83 09/16/2013   HDL 75 09/16/2013   LDLCALC 79 09/16/2013   ALT 11 09/19/2014   AST 14 09/19/2014   NA 140 09/19/2014   K 4.3 09/19/2014   CL 105 09/19/2014   CREATININE 0.92 09/19/2014   BUN 11 09/19/2014   CO2 23 09/19/2014   TSH 1.110 06/27/2013    Nm Gastric Emptying  11/09/2014   CLINICAL DATA:  Nausea vomiting.  Pain.  EXAM: NUCLEAR MEDICINE GASTRIC EMPTYING SCAN  TECHNIQUE: After oral ingestion of radiolabeled meal, sequential abdominal images were obtained for 120 minutes. Residual percentage of activity remaining within the stomach was  calculated at 60 and 120 minutes.  RADIOPHARMACEUTICALS:  2.1 mCi Technetium 99-m labeled sulfur colloid  COMPARISON:  CT 09/29/2014.  FINDINGS: Expected location of the stomach in the left upper quadrant. Ingested meal empties the stomach gradually over the course of the study with 55% retention at 60 min and 21% retention at 120 min (normal retention less than 30% at a 120 min).  IMPRESSION: Normal gastric emptying study.   Electronically Signed   By: Marcello Moores  Register   On: 11/09/2014 09:55    Assessment & Plan:   Margaret Barker was seen today for back pain.  Diagnoses and all orders for this visit:  Sciatica, right  Other orders -     predniSONE (DELTASONE) 10 MG tablet; Take 5 daily for 3 days followed by 4,3,2 and 1 for 3 days each.  I have discontinued Margaret Barker's Probiotic Product (PROBIOTIC DAILY PO), OVER THE COUNTER MEDICATION, OVER THE COUNTER MEDICATION, Hyoscyamine Sulfate, fluconazole, and amoxicillin. I am also having her start on predniSONE. Additionally, I am having her maintain her solifenacin, acetaminophen, DULoxetine, QUEtiapine, busPIRone, valACYclovir, and ranitidine.  Meds ordered this encounter  Medications  . predniSONE (DELTASONE) 10 MG tablet    Sig: Take 5 daily for 3 days followed by 4,3,2 and 1 for 3 days each.    Dispense:  45 tablet    Refill:  0     Follow-up: No Follow-up on file.  Claretta Fraise, M.D.

## 2015-01-20 NOTE — Patient Instructions (Signed)
Decrease use of right leg. Apply heat frequently. Avoid burns - do not lay on heating pad. Turn it off if getting drowsy.

## 2015-01-23 ENCOUNTER — Telehealth: Payer: Self-pay | Admitting: Family Medicine

## 2015-01-23 NOTE — Telephone Encounter (Signed)
Please order the referral. Thanks, WS

## 2015-01-24 ENCOUNTER — Other Ambulatory Visit: Payer: Self-pay | Admitting: *Deleted

## 2015-01-24 DIAGNOSIS — H5789 Other specified disorders of eye and adnexa: Secondary | ICD-10-CM

## 2015-01-24 NOTE — Telephone Encounter (Signed)
Referral made 

## 2015-02-20 DIAGNOSIS — H11442 Conjunctival cysts, left eye: Secondary | ICD-10-CM | POA: Diagnosis not present

## 2015-03-01 ENCOUNTER — Other Ambulatory Visit: Payer: Self-pay | Admitting: Gastroenterology

## 2015-03-04 ENCOUNTER — Other Ambulatory Visit: Payer: Self-pay | Admitting: Nurse Practitioner

## 2015-03-07 DIAGNOSIS — K529 Noninfective gastroenteritis and colitis, unspecified: Secondary | ICD-10-CM | POA: Diagnosis not present

## 2015-03-16 DIAGNOSIS — R102 Pelvic and perineal pain: Secondary | ICD-10-CM | POA: Diagnosis not present

## 2015-03-19 DIAGNOSIS — F329 Major depressive disorder, single episode, unspecified: Secondary | ICD-10-CM | POA: Diagnosis not present

## 2015-03-28 ENCOUNTER — Ambulatory Visit (INDEPENDENT_AMBULATORY_CARE_PROVIDER_SITE_OTHER): Payer: Medicare Other | Admitting: Family

## 2015-03-28 ENCOUNTER — Encounter: Payer: Self-pay | Admitting: Family

## 2015-03-28 VITALS — BP 103/72 | HR 83 | Temp 97.1°F | Ht 62.0 in | Wt 145.8 lb

## 2015-03-28 DIAGNOSIS — M5441 Lumbago with sciatica, right side: Secondary | ICD-10-CM | POA: Diagnosis not present

## 2015-03-28 DIAGNOSIS — K219 Gastro-esophageal reflux disease without esophagitis: Secondary | ICD-10-CM

## 2015-03-28 MED ORDER — METHYLPREDNISOLONE ACETATE 80 MG/ML IJ SUSP
80.0000 mg | Freq: Once | INTRAMUSCULAR | Status: AC
  Administered 2015-03-28: 80 mg via INTRAMUSCULAR

## 2015-03-28 MED ORDER — KETOROLAC TROMETHAMINE 60 MG/2ML IM SOLN
60.0000 mg | Freq: Once | INTRAMUSCULAR | Status: AC
  Administered 2015-03-28: 60 mg via INTRAMUSCULAR

## 2015-03-28 MED ORDER — MELOXICAM 15 MG PO TABS: 15.0000 mg | ORAL_TABLET | Freq: Every day | ORAL | Status: DC

## 2015-03-28 MED ORDER — OMEPRAZOLE 20 MG PO CPDR: 20.0000 mg | DELAYED_RELEASE_CAPSULE | Freq: Every day | ORAL | Status: DC

## 2015-03-28 MED ORDER — CYCLOBENZAPRINE HCL 10 MG PO TABS: 10.0000 mg | ORAL_TABLET | Freq: Three times a day (TID) | ORAL | Status: DC | PRN

## 2015-03-28 NOTE — Patient Instructions (Signed)
Sciatica Sciatica is pain, weakness, numbness, or tingling along the path of the sciatic nerve. The nerve starts in the lower back and runs down the back of each leg. The nerve controls the muscles in the lower leg and in the back of the knee, while also providing sensation to the back of the thigh, lower leg, and the sole of your foot. Sciatica is a symptom of another medical condition. For instance, nerve damage or certain conditions, such as a herniated disk or bone spur on the spine, pinch or put pressure on the sciatic nerve. This causes the pain, weakness, or other sensations normally associated with sciatica. Generally, sciatica only affects one side of the body. CAUSES   Herniated or slipped disc.  Degenerative disk disease.  A pain disorder involving the narrow muscle in the buttocks (piriformis syndrome).  Pelvic injury or fracture.  Pregnancy.  Tumor (rare). SYMPTOMS  Symptoms can vary from mild to very severe. The symptoms usually travel from the low back to the buttocks and down the back of the leg. Symptoms can include:  Mild tingling or dull aches in the lower back, leg, or hip.  Numbness in the back of the calf or sole of the foot.  Burning sensations in the lower back, leg, or hip.  Sharp pains in the lower back, leg, or hip.  Leg weakness.  Severe back pain inhibiting movement. These symptoms may get worse with coughing, sneezing, laughing, or prolonged sitting or standing. Also, being overweight may worsen symptoms. DIAGNOSIS  Your caregiver will perform a physical exam to look for common symptoms of sciatica. He or she may ask you to do certain movements or activities that would trigger sciatic nerve pain. Other tests may be performed to find the cause of the sciatica. These may include:  Blood tests.  X-rays.  Imaging tests, such as an MRI or CT scan. TREATMENT  Treatment is directed at the cause of the sciatic pain. Sometimes, treatment is not necessary  and the pain and discomfort goes away on its own. If treatment is needed, your caregiver may suggest:  Over-the-counter medicines to relieve pain.  Prescription medicines, such as anti-inflammatory medicine, muscle relaxants, or narcotics.  Applying heat or ice to the painful area.  Steroid injections to lessen pain, irritation, and inflammation around the nerve.  Reducing activity during periods of pain.  Exercising and stretching to strengthen your abdomen and improve flexibility of your spine. Your caregiver may suggest losing weight if the extra weight makes the back pain worse.  Physical therapy.  Surgery to eliminate what is pressing or pinching the nerve, such as a bone spur or part of a herniated disk. HOME CARE INSTRUCTIONS   Only take over-the-counter or prescription medicines for pain or discomfort as directed by your caregiver.  Apply ice to the affected area for 20 minutes, 3-4 times a day for the first 48-72 hours. Then try heat in the same way.  Exercise, stretch, or perform your usual activities if these do not aggravate your pain.  Attend physical therapy sessions as directed by your caregiver.  Keep all follow-up appointments as directed by your caregiver.  Do not wear high heels or shoes that do not provide proper support.  Check your mattress to see if it is too soft. A firm mattress may lessen your pain and discomfort. SEEK IMMEDIATE MEDICAL CARE IF:   You lose control of your bowel or bladder (incontinence).  You have increasing weakness in the lower back, pelvis, buttocks,   or legs.  You have redness or swelling of your back.  You have a burning sensation when you urinate.  You have pain that gets worse when you lie down or awakens you at night.  Your pain is worse than you have experienced in the past.  Your pain is lasting longer than 4 weeks.  You are suddenly losing weight without reason. MAKE SURE YOU:  Understand these  instructions.  Will watch your condition.  Will get help right away if you are not doing well or get worse. Document Released: 09/23/2001 Document Revised: 03/30/2012 Document Reviewed: 02/08/2012 Exit[employer] Patient Information 2015 [employer]. This information is not intended to replace advice given to you by your health care provider. Make sure you discuss any questions you have with your health care provider.  

## 2015-03-28 NOTE — Progress Notes (Signed)
   Subjective:    Patient ID: Margaret Barker, female    DOB: Jan 14, 1971, 44 y.o.   MRN: [id number]  Back Pain This is a new problem. The current episode started more than 1 month ago. The problem occurs constantly. The problem has been waxing and waning since onset. The pain is present in the lumbar spine. The quality of the pain is described as shooting and aching. The pain is at a severity of 8/10. The pain is moderate. The symptoms are aggravated by bending and standing. Associated symptoms include leg pain and tingling. Pertinent negatives include no bladder incontinence, bowel incontinence, dysuria, headaches or weakness. Treatments tried: prednisone. The treatment provided mild relief.      Review of Systems  Constitutional: Negative.   HENT: Negative.   Eyes: Negative.   Respiratory: Negative.  Negative for shortness of breath.   Cardiovascular: Negative.  Negative for palpitations.  Gastrointestinal: Negative.  Negative for bowel incontinence.  Endocrine: Negative.   Genitourinary: Negative.  Negative for bladder incontinence and dysuria.  Musculoskeletal: Positive for back pain.  Neurological: Positive for tingling. Negative for weakness and headaches.  Hematological: Negative.   Psychiatric/Behavioral: Negative.   All other systems reviewed and are negative.      Objective:   Physical Exam  Constitutional: She is oriented to person, place, and time. She appears well-developed and well-nourished. No distress.  HENT:  Head: Normocephalic and atraumatic.  Eyes: Pupils are equal, round, and reactive to light.  Neck: Normal range of motion. Neck supple. No thyromegaly present.  Cardiovascular: Normal rate, regular rhythm, normal heart sounds and intact distal pulses.   No murmur heard. Pulmonary/Chest: Effort normal and breath sounds normal. No respiratory distress. She has no wheezes.  Abdominal: Soft. Bowel sounds are normal. She exhibits no distension. There is no  tenderness.  Musculoskeletal: Normal range of motion. She exhibits no edema or tenderness.  Neurological: She is alert and oriented to person, place, and time. She has normal reflexes. No cranial nerve deficit.  Skin: Skin is warm and dry.  Psychiatric: She has a normal mood and affect. Her behavior is normal. Judgment and thought content normal.  Vitals reviewed.   BP 103/72 mmHg  Pulse 83  Temp(Src) 97.1 F (36.2 C) (Oral)  Ht 5\' 2"  (1.575 m)  Wt 145 lb 12.8 oz (66.134 kg)  BMI 26.66 kg/m2       Assessment & Plan:   1. Right-sided low back pain with right-sided sciatica -Rest -ice -No other NSAID's  -Take Prilosec with Mobic to avoid gastritis  -Sedation precaution discussed - ketorolac (TORADOL) injection 60 mg; Inject 2 mLs (60 mg total) into the muscle once. - methylPREDNISolone acetate (DEPO-MEDROL) injection 80 mg; Inject 1 mL (80 mg total) into the muscle once. - meloxicam (MOBIC) 15 MG tablet; Take 1 tablet (15 mg total) by mouth daily.  Dispense: 30 tablet; Refill: 0 Flexeril 10 mg TID  2. Gastroesophageal reflux disease, esophagitis presence not specified -Stop Mobic if stomach issues develop - omeprazole (PRILOSEC) 20 MG capsule; Take 1 capsule (20 mg total) by mouth daily.  Dispense: 30 capsule; Refill: [place], FNP

## 2015-04-02 ENCOUNTER — Ambulatory Visit: Payer: Medicare Other | Admitting: Family

## 2015-04-03 ENCOUNTER — Ambulatory Visit: Payer: Medicare Other | Admitting: Family

## 2015-04-04 ENCOUNTER — Emergency Department (HOSPITAL_COMMUNITY)
Admission: EM | Admit: 2015-04-04 | Discharge: 2015-04-04 | Disposition: A | Discharge status: Home / Self Care | Payer: Medicare Other | Attending: Emergency Medicine | Admitting: Emergency Medicine

## 2015-04-04 ENCOUNTER — Encounter (HOSPITAL_COMMUNITY): Payer: Self-pay | Admitting: Emergency Medicine

## 2015-04-04 DIAGNOSIS — Z8639 Personal history of other endocrine, nutritional and metabolic disease: Secondary | ICD-10-CM | POA: Diagnosis not present

## 2015-04-04 DIAGNOSIS — M542 Cervicalgia: Secondary | ICD-10-CM | POA: Diagnosis present

## 2015-04-04 DIAGNOSIS — Z87828 Personal history of other (healed) physical injury and trauma: Secondary | ICD-10-CM | POA: Insufficient documentation

## 2015-04-04 DIAGNOSIS — F410 Panic disorder [episodic paroxysmal anxiety] without agoraphobia: Secondary | ICD-10-CM | POA: Diagnosis not present

## 2015-04-04 DIAGNOSIS — Z79899 Other long term (current) drug therapy: Secondary | ICD-10-CM | POA: Insufficient documentation

## 2015-04-04 DIAGNOSIS — Z791 Long term (current) use of non-steroidal anti-inflammatories (NSAID): Secondary | ICD-10-CM | POA: Diagnosis not present

## 2015-04-04 DIAGNOSIS — Z8619 Personal history of other infectious and parasitic diseases: Secondary | ICD-10-CM | POA: Diagnosis not present

## 2015-04-04 DIAGNOSIS — D1779 Benign lipomatous neoplasm of other sites: Secondary | ICD-10-CM | POA: Insufficient documentation

## 2015-04-04 DIAGNOSIS — D170 Benign lipomatous neoplasm of skin and subcutaneous tissue of head, face and neck: Secondary | ICD-10-CM

## 2015-04-04 DIAGNOSIS — F319 Bipolar disorder, unspecified: Secondary | ICD-10-CM | POA: Insufficient documentation

## 2015-04-04 DIAGNOSIS — K219 Gastro-esophageal reflux disease without esophagitis: Secondary | ICD-10-CM | POA: Diagnosis not present

## 2015-04-04 DIAGNOSIS — Z720 Tobacco use: Secondary | ICD-10-CM | POA: Diagnosis not present

## 2015-04-04 NOTE — Discharge Instructions (Signed)
Lipoma  A lipoma is a noncancerous (benign) tumor composed of fat cells. They are usually found under the skin (subcutaneous). A lipoma may occur in any tissue of the body that contains fat. Common areas for lipomas to appear include the back, shoulders, buttocks, and thighs. Lipomas are a very common soft tissue growth. They are soft and grow slowly. Most problems caused by a lipoma depend on where it is growing.  DIAGNOSIS   A lipoma can be diagnosed with a physical exam. These tumors rarely become cancerous, but radiographic studies can help determine this for certain. Studies used may include:  · Computerized X-ray scans (CT or CAT scan).  · Computerized magnetic scans (MRI).  TREATMENT   Small lipomas that are not causing problems may be watched. If a lipoma continues to enlarge or causes problems, removal is often the best treatment. Lipomas can also be removed to improve appearance. Surgery is done to remove the fatty cells and the surrounding capsule. Most often, this is done with medicine that numbs the area (local anesthetic). The removed tissue is examined under a microscope to make sure it is not cancerous. Keep all follow-up appointments with your caregiver.  SEEK MEDICAL CARE IF:   · The lipoma becomes larger or hard.  · The lipoma becomes painful, red, or increasingly swollen. These could be signs of infection or a more serious condition.  Document Released: 09/19/2002 Document Revised: 12/22/2011 Document Reviewed: 03/01/2010  ExitCare® Patient Information ©2015 [employer]. This information is not intended to replace advice given to you by your health care provider. Make sure you discuss any questions you have with your health care provider.

## 2015-04-04 NOTE — ED Notes (Addendum)
PT c/o tightness and pain to left side of neck with a knot to left base of skull at neck line x3 days with no reported injury. PT denies any recent illness or fever.

## 2015-04-04 NOTE — ED Provider Notes (Signed)
CSN: [id number]     Arrival date & time 04/04/15  1339 History   First MD Initiated Contact with Patient 04/04/15 1347     Chief Complaint  Patient presents with  . Neck Pain     (Consider location/radiation/quality/duration/timing/severity/associated sxs/prior Treatment) HPI Comments: Pt comes in with c/o knot on the base of her scalp. She states that her neck is sore. Denies redness or warmth. No fever. No neck stiffness. No injury. No numbness or weakness  The history is provided by the patient. No language interpreter was used.    Past Medical History  Diagnosis Date  . Alcoholism   . Bipolar disorder   . Anxiety   . Depression   . Panic attacks   . Frequency of urination   . SUI (stress urinary incontinence, female)   . Nocturia   . Hyperlipidemia   . History of panic attacks   . GERD (gastroesophageal reflux disease)   . Helicobacter pylori gastritis     DX  04-13-2014-- TREATED WITH ANTBIOTICS  . Esophagitis   . Erosion of vaginal wall due to surgical mesh   . Wears glasses    Past Surgical History  Procedure Laterality Date  . Sacrospinus anterior culposuspension with uphold mesh and solyx single incision transurethral sling  08-09-2009 DR TANNENBAUM    STRESS INCONTINENCE W/ PELVIC FLOOR PROLAPSE  . Left breast excisional bx  03-26-2007    BENIGN  . Laparoscopic assisted vaginal hysterectomy  10-22-2006  . Right excisional breast bx  10-23-2005    BENIGN  . Cholecystectomy  1992  . D & c hysteroscopy w/ polyp removal  04-22-2006  . Lesion removal  07/09/2012    Procedure: EXCISION VAGINAL LESION;  Surgeon: Ailene Rud, MD;  Location: [facility];  Service: Urology;  Laterality: N/A;  Excision of Vaginal apical cyst  . Tubal ligation    . Pubovaginal sling N/A 04/24/2014    Procedure: EXCISE OF VAGINAL APICAL MESH EXTRUSION;  Surgeon: Ailene Rud, MD;  Location[facility];  Service: Urology;  Laterality: N/A;  .  Cystoscopy N/A 04/24/2014    Procedure: CYSTOSCOPY;  Surgeon: Ailene Rud, MD;  Location: [facility];  Service: Urology;  Laterality: N/A;  . Abdominal hysterectomy    . Colonoscopy N/A 10/23/2014    Procedure: COLONOSCOPY;  Surgeon: Inda Castle, MD;  Location: [facility];  Service: Endoscopy;  Laterality: N/A;  . Esophagogastroduodenoscopy (egd) with propofol N/A 10/23/2014    Procedure: ESOPHAGOGASTRODUODENOSCOPY (EGD) WITH PROPOFOL;  Surgeon: Inda Castle, MD;  Location: [facility];  Service: Endoscopy;  Laterality: N/A;   Family History  Problem Relation Age of Onset  . Diabetes Mother   . Heart disease Father   . COPD Father   . Hypertension Father   . Cervical cancer Paternal Grandmother   . Colon cancer Paternal Uncle   . Liver cancer Paternal Uncle   . Colitis Father   . Colitis Sister    History  Substance Use Topics  . Smoking status: Current Every Day Smoker -- 0.50 packs/day for 24 years    Types: Cigarettes  . Smokeless tobacco: Never Used  . Alcohol Use: No   OB History    Gravida Para Term Preterm AB TAB SAB Ectopic Multiple Living            3     Review of Systems  All other systems reviewed and are negative.     Allergies  Depakote er; Enablex; Naprosyn; and Neurontin  Home Medications   Prior to Admission medications   Medication Sig Start Date End Date Taking? Authorizing Provider  acetaminophen (TYLENOL) 500 MG tablet Take 1,000 mg by mouth every 6 (six) hours as needed for moderate pain or headache.    Historical Provider, MD  busPIRone (BUSPAR) 10 MG tablet Take 10 mg by mouth 2 (two) times daily.    Historical Provider, MD  cyclobenzaprine (FLEXERIL) 10 MG tablet Take 1 tablet (10 mg total) by mouth 3 (three) times daily as needed for muscle spasms. 03/28/15   Sharion Balloon, FNP  DULoxetine (CYMBALTA) 60 MG capsule Take 60 mg by mouth every morning.    Historical Provider, MD  Hyoscyamine Sulfate 0.375 MG TBCR  Take by mouth. Dr. Deatra Ina    Historical Provider, MD  meloxicam (MOBIC) 15 MG tablet Take 1 tablet (15 mg total) by mouth daily. 03/28/15   Sharion Balloon, FNP  omeprazole (PRILOSEC) 20 MG capsule Take 1 capsule (20 mg total) by mouth daily. 03/28/15   Sharion Balloon, FNP  QUEtiapine (SEROQUEL) 100 MG tablet Take 100 mg by mouth at bedtime.    Historical Provider, MD  ranitidine (ZANTAC) 150 MG tablet TAKE ONE TABLET BY MOUTH TWICE DAILY 03/02/15   Inda Castle, MD  solifenacin (VESICARE) 10 MG tablet Take 10 mg by mouth every morning.     Historical Provider, MD  traZODone (DESYREL) 100 MG tablet Take 100 mg by mouth at bedtime. 03/10/15   Historical Provider, MD  valACYclovir (VALTREX) 1000 MG tablet TAKE ONE TABLET BY MOUTH TWICE DAILY 03/05/15   Mary-Margaret Hassell Done, FNP   BP 118/82 mmHg  Pulse 76  Temp(Src) 98.2 F (36.8 C) (Oral)  Resp 18  Ht 5\' 4"  (1.626 m)  Wt 145 lb (65.772 kg)  BMI 24.88 kg/m2  SpO2 100% Physical Exam  Constitutional: She is oriented to person, place, and time. She appears well-developed and well-nourished.  HENT:  Right Ear: External ear normal.  Left Ear: External ear normal.  Fluctuant are noted to the posterior scalp. No redness or warmth to the area  Neck: Normal range of motion. Neck supple.  No neck stiffness or rigidity  Cardiovascular: Normal rate and regular rhythm.   Pulmonary/Chest: Effort normal and breath sounds normal.  Musculoskeletal: Normal range of motion.  Neurological: She is oriented to person, place, and time. She exhibits normal muscle tone. Coordination normal.  Nursing note and vitals reviewed.   ED Course  Procedures (including critical care time) Labs Review Labs Reviewed - No data to display  Imaging Review No results found.   EKG Interpretation None      MDM   Final diagnoses:  Lipoma of scalp    Consistent with lipoma or cyst. No neck stiffness. Discussed findings with pt    Glendell Docker,  NP 04/04/15 [place], MD 04/05/15 [phone]

## 2015-04-04 NOTE — ED Notes (Signed)
NP [place] at bedside.

## 2015-04-05 DIAGNOSIS — H11442 Conjunctival cysts, left eye: Secondary | ICD-10-CM | POA: Diagnosis not present

## 2015-04-06 ENCOUNTER — Encounter: Payer: Self-pay | Admitting: Physician Assistant

## 2015-04-06 ENCOUNTER — Ambulatory Visit (INDEPENDENT_AMBULATORY_CARE_PROVIDER_SITE_OTHER): Payer: Medicare Other | Admitting: Physician Assistant

## 2015-04-06 VITALS — BP 107/72 | HR 74 | Temp 97.1°F | Ht 64.0 in | Wt 146.0 lb

## 2015-04-06 DIAGNOSIS — D170 Benign lipomatous neoplasm of skin and subcutaneous tissue of head, face and neck: Secondary | ICD-10-CM | POA: Diagnosis not present

## 2015-04-06 NOTE — Progress Notes (Signed)
   Subjective:    Patient ID: Margaret Barker, female    DOB: March 15, 1971, 44 y.o.   MRN: [id number]  HPI 44 y/o female presents for hospital follow up after visiting [facility]. She was diagnosed with lipoma of scalp. She states that it popped up quickly this past Monday.     Review of Systems  Skin:       Knot on back of scalp  Feels pressure from the stretched skin   All other systems reviewed and are negative.      Objective:   Physical Exam  Constitutional: She is oriented to person, place, and time. She appears well-developed and well-nourished. No distress.  Neurological: She is alert and oriented to person, place, and time.  Skin: She is not diaphoretic.  Palpable soft mass on posterior left scalp at base of hairline, approximately 2-3cm in diameter   Psychiatric: She has a normal mood and affect. Her behavior is normal. Judgment and thought content normal.  Nursing note and vitals reviewed.         Assessment & Plan:  1. Lipoma of scalp - benign in appearance - Told patient options of removal and gave handout indicating more in detail about diagnosis - Advised patient to watch for enlargement or if it becomes symptomatic that she can look into excision.    RTO prn   Len Azeez A. Benjamin Stain PA-C

## 2015-04-06 NOTE — Patient Instructions (Signed)
Lipoma  A lipoma is a noncancerous (benign) tumor composed of fat cells. They are usually found under the skin (subcutaneous). A lipoma may occur in any tissue of the body that contains fat. Common areas for lipomas to appear include the back, shoulders, buttocks, and thighs. Lipomas are a very common soft tissue growth. They are soft and grow slowly. Most problems caused by a lipoma depend on where it is growing.  DIAGNOSIS   A lipoma can be diagnosed with a physical exam. These tumors rarely become cancerous, but radiographic studies can help determine this for certain. Studies used may include:  · Computerized X-ray scans (CT or CAT scan).  · Computerized magnetic scans (MRI).  TREATMENT   Small lipomas that are not causing problems may be watched. If a lipoma continues to enlarge or causes problems, removal is often the best treatment. Lipomas can also be removed to improve appearance. Surgery is done to remove the fatty cells and the surrounding capsule. Most often, this is done with medicine that numbs the area (local anesthetic). The removed tissue is examined under a microscope to make sure it is not cancerous. Keep all follow-up appointments with your caregiver.  SEEK MEDICAL CARE IF:   · The lipoma becomes larger or hard.  · The lipoma becomes painful, red, or increasingly swollen. These could be signs of infection or a more serious condition.  Document Released: 09/19/2002 Document Revised: 12/22/2011 Document Reviewed: 03/01/2010  ExitCare® Patient Information ©2015 [employer]. This information is not intended to replace advice given to you by your health care provider. Make sure you discuss any questions you have with your health care provider.

## 2015-04-18 ENCOUNTER — Ambulatory Visit: Payer: Medicare Other | Admitting: Family Medicine

## 2015-04-25 ENCOUNTER — Encounter: Payer: Self-pay | Admitting: Family Medicine

## 2015-04-27 ENCOUNTER — Encounter: Payer: Self-pay | Admitting: Family Medicine

## 2015-05-08 ENCOUNTER — Other Ambulatory Visit: Payer: Self-pay | Admitting: Gastroenterology

## 2015-05-08 ENCOUNTER — Ambulatory Visit: Payer: Medicare Other | Admitting: Family Medicine

## 2015-05-09 ENCOUNTER — Ambulatory Visit: Payer: Medicare Other | Admitting: Family Medicine

## 2015-05-22 ENCOUNTER — Telehealth: Payer: Self-pay | Admitting: Gastroenterology

## 2015-05-23 NOTE — Telephone Encounter (Signed)
Ok

## 2015-05-23 NOTE — Telephone Encounter (Signed)
Dr Deatra Ina, Can I send in Robinul? It may be cheaper on the patient

## 2015-05-24 ENCOUNTER — Ambulatory Visit: Payer: Medicare Other | Admitting: Gastroenterology

## 2015-05-24 MED ORDER — GLYCOPYRROLATE 2 MG PO TABS: 2.0000 mg | ORAL_TABLET | Freq: Two times a day (BID) | ORAL | Status: DC

## 2015-05-24 NOTE — Telephone Encounter (Signed)
Med sent to [facility] tL/M that new med was sent to [facility]

## 2015-05-28 ENCOUNTER — Encounter: Payer: Self-pay | Admitting: Family Medicine

## 2015-05-28 ENCOUNTER — Ambulatory Visit (INDEPENDENT_AMBULATORY_CARE_PROVIDER_SITE_OTHER): Payer: Medicare Other | Admitting: Family Medicine

## 2015-05-28 VITALS — BP 120/82 | HR 74 | Temp 99.3°F | Ht 64.0 in | Wt 151.8 lb

## 2015-05-28 DIAGNOSIS — M5431 Sciatica, right side: Secondary | ICD-10-CM | POA: Diagnosis not present

## 2015-05-28 MED ORDER — CYCLOBENZAPRINE HCL 10 MG PO TABS: 10.0000 mg | ORAL_TABLET | Freq: Three times a day (TID) | ORAL | Status: DC | PRN

## 2015-05-28 MED ORDER — PREDNISONE 10 MG PO TABS: ORAL_TABLET | ORAL | Status: DC

## 2015-05-28 NOTE — Progress Notes (Signed)
Subjective:  Patient ID: Margaret Barker, female    DOB: [birth year]  Age: 44 y.o. MRN: [id number]  CC: Sciatica and Lipoma   HPI GENECIS VELEY presents for Severe cramping of right calf and foot intermittently from her sciatica. Pain radiates down the posterior right thigh. It interferes with her ability to ambulate and perform usual activities. It has been increasing over the last several days to weeks. It has been present intermittently for about 4 months.  There is a lesion on the back of her neck that has become more indurated.  History Jaclyne has a past medical history of Alcoholism; Bipolar disorder; Anxiety; Depression; Panic attacks; Frequency of urination; SUI (stress urinary incontinence, female); Nocturia; Hyperlipidemia; History of panic attacks; GERD (gastroesophageal reflux disease); Helicobacter pylori gastritis; Esophagitis; Erosion of vaginal wall due to surgical mesh; and Wears glasses.   She has past surgical history that includes [place] (08-09-2009 DR Gaynelle Arabian); LEFT BREAST EXCISIONAL BX (03-26-2007); Laparoscopic assisted vaginal hysterectomy (10-22-2006); RIGHT EXCISIONAL BREAST BX (10-23-2005); Cholecystectomy (1992); D & C HYSTEROSCOPY W/ POLYP REMOVAL (04-22-2006); Lesion removal (07/09/2012); Tubal ligation; Pubovaginal sling (N/A, 04/24/2014); Cystoscopy (N/A, 04/24/2014); Abdominal hysterectomy; Colonoscopy (N/A, 10/23/2014); and Esophagogastroduodenoscopy (egd) with propofol (N/A, 10/23/2014).   Her family history includes COPD in her father; Cervical cancer in her paternal grandmother; Colitis in her father and sister; Colon cancer in her paternal uncle; Diabetes in her mother; Heart disease in her father; Hypertension in her father; Liver cancer in her paternal uncle.She reports that she has been smoking Cigarettes.  She has a 12 pack-year smoking history. She has never used  smokeless tobacco. She reports that she uses illicit drugs (Marijuana). She reports that she does not drink alcohol.  Outpatient Prescriptions Prior to Visit  Medication Sig Dispense Refill  . busPIRone (BUSPAR) 10 MG tablet Take 10 mg by mouth 2 (two) times daily.    . DULoxetine (CYMBALTA) 60 MG capsule Take 60 mg by mouth every morning.    Marland Kitchen glycopyrrolate (ROBINUL) 2 MG tablet Take 1 tablet (2 mg total) by mouth 2 (two) times daily. 60 tablet 3  . omeprazole (PRILOSEC) 20 MG capsule Take 1 capsule (20 mg total) by mouth daily. 30 capsule 3  . QUEtiapine (SEROQUEL) 100 MG tablet Take 100 mg by mouth at bedtime.    . ranitidine (ZANTAC) 150 MG tablet TAKE ONE TABLET BY MOUTH TWICE DAILY 60 tablet 0  . solifenacin (VESICARE) 10 MG tablet Take 10 mg by mouth every morning.     . traZODone (DESYREL) 100 MG tablet Take 100 mg by mouth at bedtime.    . valACYclovir (VALTREX) 1000 MG tablet TAKE ONE TABLET BY MOUTH TWICE DAILY 60 tablet 1  . cyclobenzaprine (FLEXERIL) 10 MG tablet Take 1 tablet (10 mg total) by mouth 3 (three) times daily as needed for muscle spasms. 30 tablet 0  . Hyoscyamine Sulfate 0.375 MG TBCR Take by mouth. Dr. Deatra Ina    . meloxicam (MOBIC) 15 MG tablet Take 1 tablet (15 mg total) by mouth daily. 30 tablet 0   Facility-Administered Medications Prior to Visit  Medication Dose Route Frequency Provider Last Rate Last Dose  . bupivacaine-EPINEPHrine (MARCAINE W/ EPI) 0.25 % (with pres) injection 20 mL  20 mL Infiltration Once Carolan Clines, MD        ROS Review of Systems  Constitutional: Negative for fever, chills, diaphoresis, appetite change, fatigue and unexpected weight change.  HENT: Negative  for congestion, ear pain, hearing loss, postnasal drip, rhinorrhea, sneezing, sore throat and trouble swallowing.   Eyes: Negative for pain.  Respiratory: Negative for cough, chest tightness and shortness of breath.   Cardiovascular: Negative for chest pain and palpitations.   Gastrointestinal: Negative for nausea, vomiting, abdominal pain, diarrhea and constipation.  Genitourinary: Negative for dysuria, frequency and menstrual problem.  Musculoskeletal: Positive for myalgias and back pain.  Skin: Negative for rash.  Neurological: Positive for tremors and weakness (right lower extremity). Negative for dizziness, numbness and headaches.  Psychiatric/Behavioral: Negative for dysphoric mood and agitation.    Objective:  BP 120/82 mmHg  Pulse 74  Temp(Src) 99.3 F (37.4 C) (Oral)  Ht '5\' 4"'  (1.626 m)  Wt 151 lb 12.8 oz (68.856 kg)  BMI 26.04 kg/m2  BP Readings from Last 3 Encounters:  05/28/15 120/82  04/06/15 107/72  04/04/15 118/82    Wt Readings from Last 3 Encounters:  05/28/15 151 lb 12.8 oz (68.856 kg)  04/06/15 146 lb (66.225 kg)  04/04/15 145 lb (65.772 kg)     Physical Exam  Constitutional: She is oriented to person, place, and time. She appears well-developed and well-nourished. No distress.  HENT:  Head: Normocephalic and atraumatic.  Eyes: Conjunctivae are normal. Pupils are equal, round, and reactive to light.  Neck: Normal range of motion. Neck supple. No thyromegaly present.  Cardiovascular: Normal rate, regular rhythm and normal heart sounds.   No murmur heard. Pulmonary/Chest: Effort normal and breath sounds normal. No respiratory distress. She has no wheezes. She has no rales.  Abdominal: Soft. Bowel sounds are normal. She exhibits no distension. There is no tenderness.  Musculoskeletal: She exhibits tenderness (Right sciatic notch).  Right lower extremity full range of motion but painful for straight leg raise.  Lymphadenopathy:    She has no cervical adenopathy.  Neurological: She is alert and oriented to person, place, and time.  Skin: Skin is warm and dry.  1 cm rubbery lesion under the scalp at the nuchal ridge  Psychiatric: She has a normal mood and affect. Her behavior is normal. Judgment and thought content normal.     No results found for: HGBA1C  Lab Results  Component Value Date   WBC 14.1* 05/28/2015   HGB 14.0 09/19/2014   HCT 43.0 05/28/2015   PLT 243 09/19/2014   GLUCOSE 87 05/28/2015   CHOL 171 09/16/2013   TRIG 83 09/16/2013   HDL 75 09/16/2013   LDLCALC 79 09/16/2013   ALT 75* 05/28/2015   AST 62* 05/28/2015   NA 137 05/28/2015   K 4.0 05/28/2015   CL 100 05/28/2015   CREATININE 0.90 05/28/2015   BUN 14 05/28/2015   CO2 19 05/28/2015   TSH 1.110 06/27/2013    No results found.  Assessment & Plan:   [place] was seen today for sciatica and lipoma.  Diagnoses and all orders for this visit:  Sciatica, right -     CMP14+EGFR -     CBC with Differential/Platelet -     Sedimentation rate -     Magnesium -     Phosphorus  Other orders -     predniSONE (DELTASONE) 10 MG tablet; Take 5 daily for 3 days followed by 4,3,2 and 1 for 3 days each. -     cyclobenzaprine (FLEXERIL) 10 MG tablet; Take 1 tablet (10 mg total) by mouth 3 (three) times daily as needed for muscle spasms. -     CBC with Differential/Platelet  I have discontinued  Ms. Vanzandt's Hyoscyamine Sulfate, meloxicam, and cyclobenzaprine. I am also having her start on predniSONE and cyclobenzaprine. Additionally, I am having her maintain her solifenacin, DULoxetine, QUEtiapine, busPIRone, valACYclovir, traZODone, omeprazole, ranitidine, and glycopyrrolate.  Meds ordered this encounter  Medications  . predniSONE (DELTASONE) 10 MG tablet    Sig: Take 5 daily for 3 days followed by 4,3,2 and 1 for 3 days each.    Dispense:  45 tablet    Refill:  0  . cyclobenzaprine (FLEXERIL) 10 MG tablet    Sig: Take 1 tablet (10 mg total) by mouth 3 (three) times daily as needed for muscle spasms.    Dispense:  90 tablet    Refill:  1     Follow-up: Return in about 6 weeks (around 07/09/2015).  Claretta Fraise, M.D.

## 2015-05-29 LAB — CMP14+EGFR
ALT: 75 IU/L — AB (ref 0–32)
AST: 62 IU/L — ABNORMAL HIGH (ref 0–40)
Albumin/Globulin Ratio: 2 (ref 1.1–2.5)
Albumin: 5 g/dL (ref 3.5–5.5)
Alkaline Phosphatase: 90 IU/L (ref 39–117)
BILIRUBIN TOTAL: 0.3 mg/dL (ref 0.0–1.2)
BUN/Creatinine Ratio: 16 (ref 9–23)
BUN: 14 mg/dL (ref 6–24)
CALCIUM: 9.9 mg/dL (ref 8.7–10.2)
CHLORIDE: 100 mmol/L (ref 97–108)
CO2: 19 mmol/L (ref 18–29)
Creatinine, Ser: 0.9 mg/dL (ref 0.57–1.00)
GFR, EST AFRICAN AMERICAN: 91 mL/min/{1.73_m2} (ref >59)
GFR, EST NON AFRICAN AMERICAN: 79 mL/min/{1.73_m2} (ref >59)
GLUCOSE: 87 mg/dL (ref 65–99)
Globulin, Total: 2.5 g/dL (ref 1.5–4.5)
Potassium: 4 mmol/L (ref 3.5–5.2)
Sodium: 137 mmol/L (ref 134–144)
TOTAL PROTEIN: 7.5 g/dL (ref 6.0–8.5)

## 2015-05-29 LAB — CBC WITH DIFFERENTIAL/PLATELET
Basophils Absolute: 0 10*3/uL (ref 0.0–0.2)
Basos: 0 %
EOS (ABSOLUTE): 0.1 10*3/uL (ref 0.0–0.4)
Eos: 1 %
HEMOGLOBIN: 14.7 g/dL (ref 11.1–15.9)
Hematocrit: 43 % (ref 34.0–46.6)
Immature Grans (Abs): 0 10*3/uL (ref 0.0–0.1)
Immature Granulocytes: 0 %
Lymphocytes Absolute: 3.5 10*3/uL — ABNORMAL HIGH (ref 0.7–3.1)
Lymphs: 25 %
MCH: 33 pg (ref 26.6–33.0)
MCHC: 34.2 g/dL (ref 31.5–35.7)
MCV: 96 fL (ref 79–97)
MONOCYTES: 5 %
Monocytes Absolute: 0.7 10*3/uL (ref 0.1–0.9)
Neutrophils Absolute: 9.7 10*3/uL — ABNORMAL HIGH (ref 1.4–7.0)
Neutrophils: 69 %
Platelets: 345 10*3/uL (ref 150–379)
RBC: 4.46 x10E6/uL (ref 3.77–5.28)
RDW: 14.4 % (ref 12.3–15.4)
WBC: 14.1 10*3/uL — AB (ref 3.4–10.8)

## 2015-05-29 LAB — PHOSPHORUS: Phosphorus: 2.6 mg/dL (ref 2.5–4.5)

## 2015-05-29 LAB — SEDIMENTATION RATE: Sed Rate: 16 mm/hr (ref 0–32)

## 2015-05-29 LAB — MAGNESIUM: Magnesium: 2 mg/dL (ref 1.6–2.3)

## 2015-06-01 ENCOUNTER — Telehealth: Payer: Self-pay | Admitting: Family Medicine

## 2015-06-01 DIAGNOSIS — M5430 Sciatica, unspecified side: Secondary | ICD-10-CM

## 2015-06-01 NOTE — Telephone Encounter (Signed)
Pt stopped prednisone tapper, was very emothional, irritable and lashing out. Did not feel it was helping her sciatica. Would like an urgent referral to a Orthopedist

## 2015-06-01 NOTE — Telephone Encounter (Signed)
Please refer to Ortho diagnosis is sciatica.

## 2015-06-02 NOTE — Telephone Encounter (Signed)
Informed patient that a referral will be made to and orthopedist for her sciatica and to call us back with any other questions or concerns

## 2015-06-04 ENCOUNTER — Other Ambulatory Visit: Payer: Self-pay | Admitting: Gastroenterology

## 2015-06-11 ENCOUNTER — Other Ambulatory Visit: Payer: Self-pay | Admitting: Gastroenterology

## 2015-06-11 ENCOUNTER — Other Ambulatory Visit: Payer: Self-pay | Admitting: Nurse Practitioner

## 2015-06-22 ENCOUNTER — Telehealth: Payer: Self-pay | Admitting: Family Medicine

## 2015-06-22 NOTE — Telephone Encounter (Signed)
Spoke with pt regarding hot flashes She is currently taking [place] and [employer] This is not relieving sxs appt scheduled

## 2015-06-25 DIAGNOSIS — F329 Major depressive disorder, single episode, unspecified: Secondary | ICD-10-CM | POA: Diagnosis not present

## 2015-06-26 ENCOUNTER — Ambulatory Visit (INDEPENDENT_AMBULATORY_CARE_PROVIDER_SITE_OTHER): Payer: Medicare Other | Admitting: Family

## 2015-06-26 ENCOUNTER — Encounter: Payer: Self-pay | Admitting: Family

## 2015-06-26 VITALS — BP 117/85 | HR 88 | Temp 97.3°F | Ht 64.0 in | Wt 153.6 lb

## 2015-06-26 DIAGNOSIS — R4586 Emotional lability: Secondary | ICD-10-CM

## 2015-06-26 DIAGNOSIS — F39 Unspecified mood [affective] disorder: Secondary | ICD-10-CM | POA: Diagnosis not present

## 2015-06-26 DIAGNOSIS — IMO0001 Reserved for inherently not codable concepts without codable children: Secondary | ICD-10-CM

## 2015-06-26 DIAGNOSIS — N951 Menopausal and female climacteric states: Secondary | ICD-10-CM | POA: Diagnosis not present

## 2015-06-26 DIAGNOSIS — Z13220 Encounter for screening for lipoid disorders: Secondary | ICD-10-CM | POA: Diagnosis not present

## 2015-06-26 DIAGNOSIS — Z136 Encounter for screening for cardiovascular disorders: Secondary | ICD-10-CM | POA: Diagnosis not present

## 2015-06-26 NOTE — Progress Notes (Signed)
   Subjective:    Patient ID: Margaret Barker, female    DOB: [birth year], 44 y.o.   MRN: [id number]  HPI Pt presents to the office today for hot flashes and mood swings. Pt states she had a partial hysterectomy in 2005. Pt states she still both ovaries. Pt states she has been taking black cohosh with no relief. Pt denies any history or family history of cervical, endometrial, or breast cancer. Pt is seen at [facility] every 3 months. Pt states she saw her psychologists yesterday and he increased her Cymbalta increase to 90 mg from 60 mg.   Review of Systems  Constitutional: Negative.   HENT: Negative.   Eyes: Negative.   Respiratory: Negative.  Negative for shortness of breath.   Cardiovascular: Negative.  Negative for palpitations.  Gastrointestinal: Negative.   Endocrine: Negative.   Genitourinary: Negative.   Musculoskeletal: Negative.   Neurological: Negative.  Negative for headaches.  Hematological: Negative.   Psychiatric/Behavioral: Negative.   All other systems reviewed and are negative.      Objective:   Physical Exam  Constitutional: She is oriented to person, place, and time. She appears well-developed and well-nourished. No distress.  HENT:  Head: Normocephalic and atraumatic.  Eyes: Pupils are equal, round, and reactive to light.  Neck: Normal range of motion. Neck supple. No thyromegaly present.  Cardiovascular: Normal rate, regular rhythm, normal heart sounds and intact distal pulses.   No murmur heard. Pulmonary/Chest: Effort normal and breath sounds normal. No respiratory distress. She has no wheezes.  Abdominal: Soft. Bowel sounds are normal. She exhibits no distension. There is no tenderness.  Musculoskeletal: Normal range of motion. She exhibits no edema or tenderness.  Neurological: She is alert and oriented to person, place, and time. She has normal reflexes. No cranial nerve deficit.  Skin: Skin is warm and dry.  Psychiatric: She has a normal mood and affect.  Her behavior is normal. Judgment and thought content normal.  Vitals reviewed.   BP 117/85 mmHg  Pulse 88  Temp(Src) 97.3 F (36.3 C) (Oral)  Ht _0  (1.626 m)  Wt 153 lb 9.6 oz (69.673 kg)  BMI 26.35 kg/m2       Assessment & Plan:  1. Hot flash, menopausal - CMP14+EGFR - FSH/LH - Thyroid Panel With TSH  2. Mood swings - CMP14+EGFR - FSH/LH - Thyroid Panel With TSH  3. Encounter for cholesteral screening for cardiovascular disease    Continue all meds Labs pending- Pt will schedule pap and then we will discuss hormone therapy  Risks discussed with hormone replacement thearpy Health Maintenance reviewed- Pt to schedule mammogram  Diet and exercise encouraged RTO 2 weeks for pap  Evelina Dun, FNP

## 2015-06-26 NOTE — Patient Instructions (Signed)
Menopause Menopause is the normal time of life when menstrual periods stop completely. Menopause is complete when you have missed 12 consecutive menstrual periods. It usually occurs between the ages of 48 years and 55 years. Very rarely does a woman develop menopause before the age of 40 years. At menopause, your ovaries stop producing the female hormones estrogen and progesterone. This can cause undesirable symptoms and also affect your health. Sometimes the symptoms may occur 4-5 years before the menopause begins. There is no relationship between menopause and:  Oral contraceptives.  Number of children you had.  Race.  The age your menstrual periods started (menarche). Heavy smokers and very thin women may develop menopause earlier in life. CAUSES  The ovaries stop producing the female hormones estrogen and progesterone.  Other causes include:  Surgery to remove both ovaries.  The ovaries stop functioning for no known reason.  Tumors of the pituitary gland in the brain.  Medical disease that affects the ovaries and hormone production.  Radiation treatment to the abdomen or pelvis.  Chemotherapy that affects the ovaries. SYMPTOMS   Hot flashes.  Night sweats.  Decrease in sex drive.  Vaginal dryness and thinning of the vagina causing painful intercourse.  Dryness of the skin and developing wrinkles.  Headaches.  Tiredness.  Irritability.  Memory problems.  Weight gain.  Bladder infections.  Hair growth of the face and chest.  Infertility. More serious symptoms include:  Loss of bone (osteoporosis) causing breaks (fractures).  Depression.  Hardening and narrowing of the arteries (atherosclerosis) causing heart attacks and strokes. DIAGNOSIS   When the menstrual periods have stopped for 12 straight months.  Physical exam.  Hormone studies of the blood. TREATMENT  There are many treatment choices and nearly as many questions about them. The  decisions to treat or not to treat menopausal changes is an individual choice made with your health care provider. Your health care provider can discuss the treatments with you. Together, you can decide which treatment will work best for you. Your treatment choices may include:   Hormone therapy (estrogen and progesterone).  Non-hormonal medicines.  Treating the individual symptoms with medicine (for example antidepressants for depression).  Herbal medicines that may help specific symptoms.  Counseling by a psychiatrist or psychologist.  Group therapy.  Lifestyle changes including:  Eating healthy.  Regular exercise.  Limiting caffeine and alcohol.  Stress management and meditation.  No treatment. HOME CARE INSTRUCTIONS   Take the medicine your health care provider gives you as directed.  Get plenty of sleep and rest.  Exercise regularly.  Eat a diet that contains calcium (good for the bones) and soy products (acts like estrogen hormone).  Avoid alcoholic beverages.  Do not smoke.  If you have hot flashes, dress in layers.  Take supplements, calcium, and vitamin D to strengthen bones.  You can use over-the-counter lubricants or moisturizers for vaginal dryness.  Group therapy is sometimes very helpful.  Acupuncture may be helpful in some cases. SEEK MEDICAL CARE IF:   You are not sure you are in menopause.  You are having menopausal symptoms and need advice and treatment.  You are still having menstrual periods after age 55 years.  You have pain with intercourse.  Menopause is complete (no menstrual period for 12 months) and you develop vaginal bleeding.  You need a referral to a specialist (gynecologist, psychiatrist, or psychologist) for treatment. SEEK IMMEDIATE MEDICAL CARE IF:   You have severe depression.  You have excessive vaginal bleeding.    You fell and think you have a broken bone.  You have pain when you urinate.  You develop leg or  chest pain.  You have a fast pounding heart beat (palpitations).  You have severe headaches.  You develop vision problems.  You feel a lump in your breast.  You have abdominal pain or severe indigestion. Document Released: 12/20/2003 Document Revised: 06/01/2013 Document Reviewed: 04/28/2013 ExitCare Patient Information 2015 [employer]. This information is not intended to replace advice given to you by your health care provider. Make sure you discuss any questions you have with your health care provider. Perimenopause Perimenopause is the time when your body begins to move into the menopause (no menstrual period for 12 straight months). It is a natural process. Perimenopause can begin 2-8 years before the menopause and usually lasts for 1 year after the menopause. During this time, your ovaries may or may not produce an egg. The ovaries vary in their production of estrogen and progesterone hormones each month. This can cause irregular menstrual periods, difficulty getting pregnant, vaginal bleeding between periods, and uncomfortable symptoms. CAUSES  Irregular production of the ovarian hormones, estrogen and progesterone, and not ovulating every month.  Other causes include:  Tumor of the pituitary gland in the brain.  Medical disease that affects the ovaries.  Radiation treatment.  Chemotherapy.  Unknown causes.  Heavy smoking and excessive alcohol intake can bring on perimenopause sooner. SIGNS AND SYMPTOMS   Hot flashes.  Night sweats.  Irregular menstrual periods.  Decreased sex drive.  Vaginal dryness.  Headaches.  Mood swings.  Depression.  Memory problems.  Irritability.  Tiredness.  Weight gain.  Trouble getting pregnant.  The beginning of losing bone cells (osteoporosis).  The beginning of hardening of the arteries (atherosclerosis). DIAGNOSIS  Your health care provider will make a diagnosis by analyzing your age, menstrual history, and  symptoms. He or she will do a physical exam and note any changes in your body, especially your female organs. Female hormone tests may or may not be helpful depending on the amount of female hormones you produce and when you produce them. However, other hormone tests may be helpful to rule out other problems. TREATMENT  In some cases, no treatment is needed. The decision on whether treatment is necessary during the perimenopause should be made by you and your health care provider based on how the symptoms are affecting you and your lifestyle. Various treatments are available, such as:  Treating individual symptoms with a specific medicine for that symptom.  Herbal medicines that can help specific symptoms.  Counseling.  Group therapy. HOME CARE INSTRUCTIONS   Keep track of your menstrual periods (when they occur, how heavy they are, how long between periods, and how long they last) as well as your symptoms and when they started.  Only take over-the-counter or prescription medicines as directed by your health care provider.  Sleep and rest.  Exercise.  Eat a diet that contains calcium (good for your bones) and soy (acts like the estrogen hormone).  Do not smoke.  Avoid alcoholic beverages.  Take vitamin supplements as recommended by your health care provider. Taking vitamin E may help in certain cases.  Take calcium and vitamin D supplements to help prevent bone loss.  Group therapy is sometimes helpful.  Acupuncture may help in some cases. SEEK MEDICAL CARE IF:   You have questions about any symptoms you are having.  You need a referral to a specialist (gynecologist, psychiatrist, or psychologist). SEEK IMMEDIATE   MEDICAL CARE IF:   You have vaginal bleeding.  Your period lasts longer than 8 days.  Your periods are recurring sooner than 21 days.  You have bleeding after intercourse.  You have severe depression.  You have pain when you urinate.  You have severe  headaches.  You have vision problems. Document Released: 11/06/2004 Document Revised: 07/20/2013 Document Reviewed: 04/28/2013 ExitCare Patient Information 2015 [employer]. This information is not intended to replace advice given to you by your health care provider. Make sure you discuss any questions you have with your health care provider.  

## 2015-06-27 LAB — CMP14+EGFR
ALBUMIN: 4.7 g/dL (ref 3.5–5.5)
ALT: 19 IU/L (ref 0–32)
AST: 18 IU/L (ref 0–40)
Albumin/Globulin Ratio: 1.8 (ref 1.1–2.5)
Alkaline Phosphatase: 77 IU/L (ref 39–117)
BUN / CREAT RATIO: 15 (ref 9–23)
BUN: 13 mg/dL (ref 6–24)
CALCIUM: 9.5 mg/dL (ref 8.7–10.2)
CO2: 21 mmol/L (ref 18–29)
Chloride: 99 mmol/L (ref 97–108)
Creatinine, Ser: 0.86 mg/dL (ref 0.57–1.00)
GFR, EST AFRICAN AMERICAN: 96 mL/min/{1.73_m2} (ref >59)
GFR, EST NON AFRICAN AMERICAN: 83 mL/min/{1.73_m2} (ref >59)
Globulin, Total: 2.6 g/dL (ref 1.5–4.5)
Glucose: 99 mg/dL (ref 65–99)
Potassium: 4.4 mmol/L (ref 3.5–5.2)
Sodium: 141 mmol/L (ref 134–144)
TOTAL PROTEIN: 7.3 g/dL (ref 6.0–8.5)

## 2015-06-27 LAB — THYROID PANEL WITH TSH
FREE THYROXINE INDEX: 2.2 (ref 1.2–4.9)
T3 Uptake Ratio: 26 % (ref 24–39)
T4 TOTAL: 8.3 ug/dL (ref 4.5–12.0)
TSH: 1.49 u[IU]/mL (ref 0.450–4.500)

## 2015-06-27 LAB — FSH/LH
FSH: 54.9 m[IU]/mL
LH: 46.3 m[IU]/mL

## 2015-06-28 MED ORDER — ESTRADIOL 1 MG PO TABS: ORAL_TABLET | ORAL | Status: DC

## 2015-06-28 NOTE — Addendum Note (Signed)
Addended by: Evelina Dun A on: 06/28/2015 04:08 PM   Modules accepted: Orders

## 2015-06-28 NOTE — Progress Notes (Signed)
Estrace rx sent to pharmacy

## 2015-07-02 DIAGNOSIS — Z1231 Encounter for screening mammogram for malignant neoplasm of breast: Secondary | ICD-10-CM | POA: Diagnosis not present

## 2015-07-06 ENCOUNTER — Ambulatory Visit (INDEPENDENT_AMBULATORY_CARE_PROVIDER_SITE_OTHER): Payer: Medicare Other | Admitting: Family Medicine

## 2015-07-06 ENCOUNTER — Encounter: Payer: Self-pay | Admitting: Family Medicine

## 2015-07-06 VITALS — BP 112/81 | HR 74 | Temp 97.9°F | Ht 64.0 in | Wt 154.0 lb

## 2015-07-06 DIAGNOSIS — R102 Pelvic and perineal pain: Secondary | ICD-10-CM

## 2015-07-06 DIAGNOSIS — Z0000 Encounter for general adult medical examination without abnormal findings: Secondary | ICD-10-CM

## 2015-07-06 NOTE — Progress Notes (Signed)
Subjective:   Margaret Barker is a 44 y.o. female who presents for Medicare Annual (Subsequent) preventive examination.  Review of Systems:  Review of Systems  Constitutional: Negative for fever and chills.  HENT: Negative for ear pain and tinnitus.   Eyes: Negative for blurred vision and pain.  Respiratory: Negative for cough, shortness of breath and wheezing.   Cardiovascular: Negative for chest pain, palpitations and leg swelling.  Gastrointestinal: Negative for abdominal pain, diarrhea, constipation, blood in stool and melena.  Genitourinary: Negative for dysuria and hematuria.  Musculoskeletal: Negative for myalgias, back pain and joint pain.  Skin: Negative for rash.  Neurological: Negative for dizziness, sensory change, focal weakness, weakness and headaches.  Psychiatric/Behavioral: Negative for depression and suicidal ideas.         Objective:     Vitals: BP 112/81 mmHg  Pulse 74  Temp(Src) 97.9 F (36.6 C) (Oral)  Ht 5\' 4"  (1.626 m)  Wt 154 lb (69.854 kg)  BMI 26.42 kg/m2  Tobacco History  Smoking status  . Current Every Day Smoker -- 1.00 packs/day for 24 years  . Types: Cigarettes  Smokeless tobacco  . Never Used     Ready to quit: Not Answered Counseling given: Not Answered   Past Medical History  Diagnosis Date  . Alcoholism   . Bipolar disorder   . Anxiety   . Depression   . Panic attacks   . Frequency of urination   . SUI (stress urinary incontinence, female)   . Nocturia   . Hyperlipidemia   . History of panic attacks   . GERD (gastroesophageal reflux disease)   . Helicobacter pylori gastritis     DX  04-13-2014-- TREATED WITH ANTBIOTICS  . Esophagitis   . Erosion of vaginal wall due to surgical mesh   . Wears glasses    Past Surgical History  Procedure Laterality Date  . Sacrospinus anterior culposuspension with uphold mesh and solyx single incision transurethral sling  08-09-2009 DR TANNENBAUM    STRESS INCONTINENCE W/ PELVIC  FLOOR PROLAPSE  . Left breast excisional bx  03-26-2007    BENIGN  . Laparoscopic assisted vaginal hysterectomy  10-22-2006  . Right excisional breast bx  10-23-2005    BENIGN  . Cholecystectomy  1992  . D & c hysteroscopy w/ polyp removal  04-22-2006  . Lesion removal  07/09/2012    Procedure: EXCISION VAGINAL LESION;  Surgeon: Ailene Rud, MD;  Location: [facility];  Service: Urology;  Laterality: N/A;  Excision of Vaginal apical cyst  . Tubal ligation    . Pubovaginal sling N/A 04/24/2014    Procedure: EXCISE OF VAGINAL APICAL MESH EXTRUSION;  Surgeon: Ailene Rud, MD;  Location: [facility];  Service: Urology;  Laterality: N/A;  . Cystoscopy N/A 04/24/2014    Procedure: CYSTOSCOPY;  Surgeon: Ailene Rud, MD;  Location[facility];  Service: Urology;  Laterality: N/A;  . Abdominal hysterectomy    . Colonoscopy N/A 10/23/2014    Procedure: COLONOSCOPY;  Surgeon: Inda Castle, MD;  Location: [facility];  Service: Endoscopy;  Laterality: N/A;  . Esophagogastroduodenoscopy (egd) with propofol N/A 10/23/2014    Procedure: ESOPHAGOGASTRODUODENOSCOPY (EGD) WITH PROPOFOL;  Surgeon: Inda Castle, MD;  Location: [facility];  Service: Endoscopy;  Laterality: N/A;   Family History  Problem Relation Age of Onset  . Diabetes Mother   . Heart disease Father   . COPD Father   . Hypertension Father   .  Cervical cancer Paternal Grandmother   . Colon cancer Paternal Uncle   . Liver cancer Paternal Uncle   . Colitis Father   . Colitis Sister    History  Sexual Activity  . Sexual Activity: Yes  . Birth Control/ Protection: Surgical    Outpatient Encounter Prescriptions as of 07/06/2015  Medication Sig  . busPIRone (BUSPAR) 10 MG tablet Take 10 mg by mouth 3 (three) times daily.   . cyclobenzaprine (FLEXERIL) 10 MG tablet Take 1 tablet (10 mg total) by mouth 3 (three) times daily as needed for muscle spasms.  .  DULoxetine (CYMBALTA) 30 MG capsule Take 30 mg by mouth daily. Three tablets once a day  . estradiol (ESTRACE) 1 MG tablet Take 21 days then off 7 days.  Marland Kitchen glycopyrrolate (ROBINUL) 2 MG tablet Take 1 tablet (2 mg total) by mouth 2 (two) times daily.  . ranitidine (ZANTAC) 150 MG tablet TAKE ONE TABLET BY MOUTH TWICE DAILY  . solifenacin (VESICARE) 10 MG tablet Take 10 mg by mouth every morning.   . traZODone (DESYREL) 100 MG tablet Take 200 mg by mouth at bedtime.   . valACYclovir (VALTREX) 1000 MG tablet TAKE ONE TABLET BY MOUTH TWICE DAILY  . [DISCONTINUED] DULoxetine (CYMBALTA) 60 MG capsule Take 60 mg by mouth every morning.  . [DISCONTINUED] omeprazole (PRILOSEC) 20 MG capsule Take 1 capsule (20 mg total) by mouth daily.   Facility-Administered Encounter Medications as of 07/06/2015  Medication  . bupivacaine-EPINEPHrine (MARCAINE W/ EPI) 0.25 % (with pres) injection 20 mL    Activities of Daily Living In your present state of health, do you have any difficulty performing the following activities: 07/06/2015 08/16/2014  Hearing? N N  Vision? Y N  Difficulty concentrating or making decisions? N N  Walking or climbing stairs? N N  Dressing or bathing? N N  Doing errands, shopping? N N  Preparing Food and eating ? N -  Using the Toilet? N -  In the past six months, have you accidently leaked urine? N -  Do you have problems with loss of bowel control? N -  Managing your Medications? N -  Managing your Finances? N -  Housekeeping or managing your Housekeeping? N -    Patient Care Team: Claretta Fraise, MD as PCP - General (Family Medicine) Robin Searing, MD (Psychiatry)    Assessment:    Problem List Items Addressed This Visit    None    Visit Diagnoses    Routine history and physical examination of adult    -  Primary    Vaginal pain        soreness since vaginal mesh repair, denies discharge, refer to Gynecology    Relevant Orders    Ambulatory referral to Gynecology        Exercise Activities and Dietary recommendations Current Exercise Habits:: Home exercise routine, Type of exercise: walking, Time (Minutes): 15, Frequency (Times/Week): 2, Weekly Exercise (Minutes/Week): 30  Goals    None     Fall Risk Fall Risk  03/14/2014  Falls in the past year? No   Depression Screen PHQ 2/9 Scores 07/06/2015 06/26/2015 03/28/2015 03/14/2014  PHQ - 2 Score 0 1 0 3  PHQ- 9 Score - - - 12     Cognitive Testing MMSE - Mini Mental State Exam 07/06/2015  Orientation to time 4  Orientation to Place 5  Registration 3  Attention/ Calculation 5  Recall 0  Language- name 2 objects 2  Language- repeat 1  Language- follow 3 step command 3  Language- read & follow direction 1  Write a sentence 1  Copy design 1  Total score 26    Immunization History  Administered Date(s) Administered  . Influenza Split 07/05/2013  . Influenza Whole 07/27/2008  . Influenza-Unspecified 07/03/2014  . Pneumococcal Conjugate-13 07/03/2014  . Td 10/13/1996   Screening Tests Health Maintenance  Topic Date Due  . INFLUENZA VACCINE  08/05/2015 (Originally 05/14/2015)  . PAP SMEAR  07/05/2016 (Originally 11/13/2008)  . HIV Screening  07/05/2016 (Originally 08/31/1986)  . TETANUS/TDAP  03/13/2022      Plan:    see assessment  During the course of the visit the patient was educated and counseled about the following appropriate screening and preventive services:   Vaccines to include Pneumoccal, Influenza, Hepatitis B, Td, Zostavax, HCV  Electrocardiogram  Cardiovascular Disease  Colorectal cancer screening  Bone density screening  Diabetes screening  Glaucoma screening  Mammography/PAP  Nutrition counseling   Patient Instructions (the written plan) was given to the patient.   Worthy Rancher, MD  07/06/2015

## 2015-07-13 ENCOUNTER — Telehealth: Payer: Self-pay | Admitting: Family Medicine

## 2015-07-13 NOTE — Telephone Encounter (Signed)
Patient advised to try OTC medications like melatonin or tylenol pm .  Take the flexeril medicine she has just before bed time also.

## 2015-07-20 ENCOUNTER — Encounter: Payer: Self-pay | Admitting: Family

## 2015-07-20 ENCOUNTER — Ambulatory Visit (INDEPENDENT_AMBULATORY_CARE_PROVIDER_SITE_OTHER): Payer: Medicare Other | Admitting: Family

## 2015-07-20 VITALS — BP 114/76 | HR 88 | Temp 98.0°F | Ht 64.0 in | Wt 156.4 lb

## 2015-07-20 DIAGNOSIS — Z1321 Encounter for screening for nutritional disorder: Secondary | ICD-10-CM

## 2015-07-20 DIAGNOSIS — K219 Gastro-esophageal reflux disease without esophagitis: Secondary | ICD-10-CM | POA: Diagnosis not present

## 2015-07-20 DIAGNOSIS — N8111 Cystocele, midline: Secondary | ICD-10-CM | POA: Diagnosis not present

## 2015-07-20 DIAGNOSIS — F411 Generalized anxiety disorder: Secondary | ICD-10-CM | POA: Diagnosis not present

## 2015-07-20 DIAGNOSIS — E559 Vitamin D deficiency, unspecified: Secondary | ICD-10-CM | POA: Diagnosis not present

## 2015-07-20 DIAGNOSIS — R1084 Generalized abdominal pain: Secondary | ICD-10-CM | POA: Diagnosis not present

## 2015-07-20 DIAGNOSIS — G4700 Insomnia, unspecified: Secondary | ICD-10-CM | POA: Diagnosis not present

## 2015-07-20 DIAGNOSIS — Z0000 Encounter for general adult medical examination without abnormal findings: Secondary | ICD-10-CM

## 2015-07-20 DIAGNOSIS — N3946 Mixed incontinence: Secondary | ICD-10-CM

## 2015-07-20 DIAGNOSIS — R5383 Other fatigue: Secondary | ICD-10-CM

## 2015-07-20 DIAGNOSIS — R7989 Other specified abnormal findings of blood chemistry: Secondary | ICD-10-CM | POA: Diagnosis not present

## 2015-07-20 DIAGNOSIS — Z01419 Encounter for gynecological examination (general) (routine) without abnormal findings: Secondary | ICD-10-CM

## 2015-07-20 DIAGNOSIS — E785 Hyperlipidemia, unspecified: Secondary | ICD-10-CM

## 2015-07-20 DIAGNOSIS — F330 Major depressive disorder, recurrent, mild: Secondary | ICD-10-CM | POA: Diagnosis not present

## 2015-07-20 NOTE — Patient Instructions (Signed)
Health Maintenance, Female Adopting a healthy lifestyle and getting preventive care can go a long way to promote health and wellness. Talk with your health care provider about what schedule of regular examinations is right for you. This is a good chance for you to check in with your provider about disease prevention and staying healthy. In between checkups, there are plenty of things you can do on your own. Experts have done a lot of research about which lifestyle changes and preventive measures are most likely to keep you healthy. Ask your health care provider for more information. WEIGHT AND DIET  Eat a healthy diet  Be sure to include plenty of vegetables, fruits, low-fat dairy products, and lean protein.  Do not eat a lot of foods high in solid fats, added sugars, or salt.  Get regular exercise. This is one of the most important things you can do for your health.  Most adults should exercise for at least 150 minutes each week. The exercise should increase your heart rate and make you sweat (moderate-intensity exercise).  Most adults should also do strengthening exercises at least twice a week. This is in addition to the moderate-intensity exercise.  Maintain a healthy weight  Body mass index (BMI) is a measurement that can be used to identify possible weight problems. It estimates body fat based on height and weight. Your health care provider can help determine your BMI and help you achieve or maintain a healthy weight.  For females 20 years of age and older:   A BMI below 18.5 is considered underweight.  A BMI of 18.5 to 24.9 is normal.  A BMI of 25 to 29.9 is considered overweight.  A BMI of 30 and above is considered obese.  Watch levels of cholesterol and blood lipids  You should start having your blood tested for lipids and cholesterol at 44 years of age, then have this test every 5 years.  You may need to have your cholesterol levels checked more often if:  Your lipid  or cholesterol levels are high.  You are older than 44 years of age.  You are at high risk for heart disease.  CANCER SCREENING   Lung Cancer  Lung cancer screening is recommended for adults 55-80 years old who are at high risk for lung cancer because of a history of smoking.  A yearly low-dose CT scan of the lungs is recommended for people who:  Currently smoke.  Have quit within the past 15 years.  Have at least a 30-pack-year history of smoking. A pack year is smoking an average of one pack of cigarettes a day for 1 year.  Yearly screening should continue until it has been 15 years since you quit.  Yearly screening should stop if you develop a health problem that would prevent you from having lung cancer treatment.  Breast Cancer  Practice breast self-awareness. This means understanding how your breasts normally appear and feel.  It also means doing regular breast self-exams. Let your health care provider know about any changes, no matter how small.  If you are in your 20s or 30s, you should have a clinical breast exam (CBE) by a health care provider every 1-3 years as part of a regular health exam.  If you are 40 or older, have a CBE every year. Also consider having a breast X-ray (mammogram) every year.  If you have a family history of breast cancer, talk to your health care provider about genetic screening.  If you   are at high risk for breast cancer, talk to your health care provider about having an MRI and a mammogram every year.  Breast cancer gene (BRCA) assessment is recommended for women who have family members with BRCA-related cancers. BRCA-related cancers include:  Breast.  Ovarian.  Tubal.  Peritoneal cancers.  Results of the assessment will determine the need for genetic counseling and BRCA1 and BRCA2 testing. Cervical Cancer Your health care provider may recommend that you be screened regularly for cancer of the pelvic organs (ovaries, uterus, and  vagina). This screening involves a pelvic examination, including checking for microscopic changes to the surface of your cervix (Pap test). You may be encouraged to have this screening done every 3 years, beginning at age 21.  For women ages 30-65, health care providers may recommend pelvic exams and Pap testing every 3 years, or they may recommend the Pap and pelvic exam, combined with testing for human papilloma virus (HPV), every 5 years. Some types of HPV increase your risk of cervical cancer. Testing for HPV may also be done on women of any age with unclear Pap test results.  Other health care providers may not recommend any screening for nonpregnant women who are considered low risk for pelvic cancer and who do not have symptoms. Ask your health care provider if a screening pelvic exam is right for you.  If you have had past treatment for cervical cancer or a condition that could lead to cancer, you need Pap tests and screening for cancer for at least 20 years after your treatment. If Pap tests have been discontinued, your risk factors (such as having a new sexual partner) need to be reassessed to determine if screening should resume. Some women have medical problems that increase the chance of getting cervical cancer. In these cases, your health care provider may recommend more frequent screening and Pap tests. Colorectal Cancer  This type of cancer can be detected and often prevented.  Routine colorectal cancer screening usually begins at 44 years of age and continues through 44 years of age.  Your health care provider may recommend screening at an earlier age if you have risk factors for colon cancer.  Your health care provider may also recommend using home test kits to check for hidden blood in the stool.  A small camera at the end of a tube can be used to examine your colon directly (sigmoidoscopy or colonoscopy). This is done to check for the earliest forms of colorectal  cancer.  Routine screening usually begins at age 50.  Direct examination of the colon should be repeated every 5-10 years through 44 years of age. However, you may need to be screened more often if early forms of precancerous polyps or small growths are found. Skin Cancer  Check your skin from head to toe regularly.  Tell your health care provider about any new moles or changes in moles, especially if there is a change in a mole's shape or color.  Also tell your health care provider if you have a mole that is larger than the size of a pencil eraser.  Always use sunscreen. Apply sunscreen liberally and repeatedly throughout the day.  Protect yourself by wearing long sleeves, pants, a wide-brimmed hat, and sunglasses whenever you are outside. HEART DISEASE, DIABETES, AND HIGH BLOOD PRESSURE   High blood pressure causes heart disease and increases the risk of stroke. High blood pressure is more likely to develop in:  People who have blood pressure in the high end   of the normal range (130-139/85-89 mm Hg).  People who are overweight or obese.  People who are African American.  If you are 38-23 years of age, have your blood pressure checked every 3-5 years. If you are 61 years of age or older, have your blood pressure checked every year. You should have your blood pressure measured twice--once when you are at a hospital or clinic, and once when you are not at a hospital or clinic. Record the average of the two measurements. To check your blood pressure when you are not at a hospital or clinic, you can use:  An automated blood pressure machine at a pharmacy.  A home blood pressure monitor.  If you are between 45 years and 39 years old, ask your health care provider if you should take aspirin to prevent strokes.  Have regular diabetes screenings. This involves taking a blood sample to check your fasting blood sugar level.  If you are at a normal weight and have a low risk for diabetes,  have this test once every three years after 44 years of age.  If you are overweight and have a high risk for diabetes, consider being tested at a younger age or more often. PREVENTING INFECTION  Hepatitis B  If you have a higher risk for hepatitis B, you should be screened for this virus. You are considered at high risk for hepatitis B if:  You were born in a country where hepatitis B is common. Ask your health care provider which countries are considered high risk.  Your parents were born in a high-risk country, and you have not been immunized against hepatitis B (hepatitis B vaccine).  You have HIV or AIDS.  You use needles to inject street drugs.  You live with someone who has hepatitis B.  You have had sex with someone who has hepatitis B.  You get hemodialysis treatment.  You take certain medicines for conditions, including cancer, organ transplantation, and autoimmune conditions. Hepatitis C  Blood testing is recommended for:  Everyone born from [birth year] through 1965.  Anyone with known risk factors for hepatitis C. Sexually transmitted infections (STIs)  You should be screened for sexually transmitted infections (STIs) including gonorrhea and chlamydia if:  You are sexually active and are younger than 44 years of age.  You are older than 44 years of age and your health care provider tells you that you are at risk for this type of infection.  Your sexual activity has changed since you were last screened and you are at an increased risk for chlamydia or gonorrhea. Ask your health care provider if you are at risk.  If you do not have HIV, but are at risk, it may be recommended that you take a prescription medicine daily to prevent HIV infection. This is called pre-exposure prophylaxis (PrEP). You are considered at risk if:  You are sexually active and do not regularly use condoms or know the HIV status of your partner(s).  You take drugs by injection.  You are sexually  active with a partner who has HIV. Talk with your health care provider about whether you are at high risk of being infected with HIV. If you choose to begin PrEP, you should first be tested for HIV. You should then be tested every 3 months for as long as you are taking PrEP.  PREGNANCY   If you are premenopausal and you may become pregnant, ask your health care provider about preconception counseling.  If you may  become pregnant, take 400 to 800 micrograms (mcg) of folic acid every day.  If you want to prevent pregnancy, talk to your health care provider about birth control (contraception). OSTEOPOROSIS AND MENOPAUSE   Osteoporosis is a disease in which the bones lose minerals and strength with aging. This can result in serious bone fractures. Your risk for osteoporosis can be identified using a bone density scan.  If you are 61 years of age or older, or if you are at risk for osteoporosis and fractures, ask your health care provider if you should be screened.  Ask your health care provider whether you should take a calcium or vitamin D supplement to lower your risk for osteoporosis.  Menopause may have certain physical symptoms and risks.  Hormone replacement therapy may reduce some of these symptoms and risks. Talk to your health care provider about whether hormone replacement therapy is right for you.  HOME CARE INSTRUCTIONS   Schedule regular health, dental, and eye exams.  Stay current with your immunizations.   Do not use any tobacco products including cigarettes, chewing tobacco, or electronic cigarettes.  If you are pregnant, do not drink alcohol.  If you are breastfeeding, limit how much and how often you drink alcohol.  Limit alcohol intake to no more than 1 drink per day for nonpregnant women. One drink equals 12 ounces of beer, 5 ounces of wine, or 1 ounces of hard liquor.  Do not use street drugs.  Do not share needles.  Ask your health care provider for help if  you need support or information about quitting drugs.  Tell your health care provider if you often feel depressed.  Tell your health care provider if you have ever been abused or do not feel safe at home.   This information is not intended to replace advice given to you by your health care provider. Make sure you discuss any questions you have with your health care provider.   Document Released: 04/14/2011 Document Revised: 10/20/2014 Document Reviewed: 08/31/2013 Elsevier Interactive Patient Education [employer].

## 2015-07-20 NOTE — Progress Notes (Signed)
Subjective:    Patient ID: Margaret Barker, female    DOB: [birth year], 44 y.o.   MRN: [id number]  Pt presents today for CPE with pap. Pt is followed by behavioral health every 3 months at [facility] for GAD, Depression, and Insomnia. Pt states she had a mammogram about two weeks ago and she has an appt to go the [facility] to have a "spot" looked at.  Gastrophageal Reflux She complains of heartburn. She reports no belching, no coughing or no stridor. This is a chronic problem. The current episode started more than 1 year ago. The problem occurs frequently. The problem has been waxing and waning. The heartburn is of moderate intensity. The heartburn wakes her from sleep. The symptoms are aggravated by caffeine, certain foods and lying down. Risk factors include obesity. She has tried a PPI for the symptoms. The treatment provided moderate relief.      Review of Systems  Constitutional: Negative.   HENT: Negative.   Eyes: Negative.   Respiratory: Negative.  Negative for cough and shortness of breath.   Cardiovascular: Negative.  Negative for palpitations.  Gastrointestinal: Positive for heartburn.  Endocrine: Negative.   Genitourinary: Negative.   Musculoskeletal: Negative.   Neurological: Negative.  Negative for headaches.  Hematological: Negative.   Psychiatric/Behavioral: Negative.   All other systems reviewed and are negative.      Objective:   Physical Exam  Constitutional: She is oriented to person, place, and time. She appears well-developed and well-nourished. No distress.  HENT:  Head: Normocephalic and atraumatic.  Right Ear: External ear normal.  Left Ear: External ear normal.  Nose: Nose normal.  Mouth/Throat: Oropharynx is clear and moist.  Eyes: Pupils are equal, round, and reactive to light.  Neck: Normal range of motion. Neck supple. No thyromegaly present.  Cardiovascular: Normal rate, regular rhythm, normal heart sounds and intact distal pulses.   No murmur  heard. Pulmonary/Chest: Effort normal and breath sounds normal. No respiratory distress. She has no wheezes. Right breast exhibits no inverted nipple, no mass, no nipple discharge, no skin change and no tenderness. Left breast exhibits no inverted nipple, no mass, no nipple discharge, no skin change and no tenderness. Breasts are symmetrical.  Abdominal: Soft. Bowel sounds are normal. She exhibits no distension. There is no tenderness.  Genitourinary: Vagina normal.  Bimanual exam- no adnexal masses or tenderness, ovaries nonpalpable   Cervix not present-No discharge   Musculoskeletal: Normal range of motion. She exhibits no edema or tenderness.  Neurological: She is alert and oriented to person, place, and time. She has normal reflexes. No cranial nerve deficit.  Skin: Skin is warm and dry.  Psychiatric: She has a normal mood and affect. Her behavior is normal. Judgment and thought content normal.  Vitals reviewed.    BP 114/76 mmHg  Pulse 88  Temp(Src) 98 F (36.7 C) (Oral)  Ht '5\' 4"'  (1.626 m)  Wt 156 lb 6.4 oz (70.943 kg)  BMI 26.83 kg/m2      Assessment & Plan:  1. Gastroesophageal reflux disease without esophagitis - CMP14+EGFR  2. Prolapse of vaginal wall with midline cystocele - CMP14+EGFR  3. Hyperlipemia - CMP14+EGFR - Lipid panel  4. Abdominal pain, generalized - CMP14+EGFR  5. MIXED INCONTINENCE URGE AND STRESS - CMP14+EGFR  6. Mild episode of recurrent major depressive disorder (HCC) - CMP14+EGFR  7. GAD (generalized anxiety disorder) - CMP14+EGFR  8. Insomnia - CMP14+EGFR  9. Laboratory tests ordered as part of a complete physical exam (CPE) -  Anemia Profile B - CMP14+EGFR - Thyroid Panel With TSH - Vit D  25 hydroxy (rtn osteoporosis monitoring) - Pap IG w/ reflex to HPV when ASC-U - Lipid panel  10. Encounter for routine gynecological examination - CMP14+EGFR - Pap IG w/ reflex to HPV when ASC-U  11. Encounter for vitamin deficiency  screening - CMP14+EGFR  12. Other fatigue - Anemia Profile B - CMP14+EGFR - Thyroid Panel With TSH - Vit D  25 hydroxy (rtn osteoporosis monitoring)   Continue all meds Labs pending Health Maintenance reviewed Diet and exercise encouraged RTO 1 year, keep appts with Behavioral Health, and Follow up for breast biopsy   Evelina Dun, FNP

## 2015-07-21 LAB — ANEMIA PROFILE B
BASOS ABS: 0 10*3/uL (ref 0.0–0.2)
Basos: 1 %
EOS (ABSOLUTE): 0.2 10*3/uL (ref 0.0–0.4)
Eos: 2 %
FOLATE: 19.5 ng/mL (ref >3.0)
Ferritin: 56 ng/mL (ref 15–150)
HEMOGLOBIN: 14.5 g/dL (ref 11.1–15.9)
Hematocrit: 42.3 % (ref 34.0–46.6)
IMMATURE GRANS (ABS): 0 10*3/uL (ref 0.0–0.1)
IMMATURE GRANULOCYTES: 0 %
Iron Saturation: 12 % — ABNORMAL LOW (ref 15–55)
Iron: 41 ug/dL (ref 27–159)
LYMPHS ABS: 2.4 10*3/uL (ref 0.7–3.1)
LYMPHS: 29 %
MCH: 32.9 pg (ref 26.6–33.0)
MCHC: 34.3 g/dL (ref 31.5–35.7)
MCV: 96 fL (ref 79–97)
MONOS ABS: 0.5 10*3/uL (ref 0.1–0.9)
Monocytes: 6 %
Neutrophils Absolute: 5.2 10*3/uL (ref 1.4–7.0)
Neutrophils: 62 %
PLATELETS: 299 10*3/uL (ref 150–379)
RBC: 4.41 x10E6/uL (ref 3.77–5.28)
RDW: 14.6 % (ref 12.3–15.4)
Retic Ct Pct: 0.8 % (ref 0.6–2.6)
Total Iron Binding Capacity: 342 ug/dL (ref 250–450)
UIBC: 301 ug/dL (ref 131–425)
Vitamin B-12: 544 pg/mL (ref 211–946)
WBC: 8.4 10*3/uL (ref 3.4–10.8)

## 2015-07-21 LAB — CMP14+EGFR
ALT: 32 IU/L (ref 0–32)
AST: 23 IU/L (ref 0–40)
Albumin/Globulin Ratio: 1.8 (ref 1.1–2.5)
Albumin: 4.6 g/dL (ref 3.5–5.5)
Alkaline Phosphatase: 83 IU/L (ref 39–117)
BUN/Creatinine Ratio: 12 (ref 9–23)
BUN: 10 mg/dL (ref 6–24)
Bilirubin Total: 0.2 mg/dL (ref 0.0–1.2)
CALCIUM: 9.5 mg/dL (ref 8.7–10.2)
CO2: 25 mmol/L (ref 18–29)
CREATININE: 0.82 mg/dL (ref 0.57–1.00)
Chloride: 98 mmol/L (ref 97–108)
GFR calc Af Amer: 101 mL/min/{1.73_m2} (ref >59)
GFR calc non Af Amer: 88 mL/min/{1.73_m2} (ref >59)
GLUCOSE: 74 mg/dL (ref 65–99)
Globulin, Total: 2.6 g/dL (ref 1.5–4.5)
Potassium: 4.3 mmol/L (ref 3.5–5.2)
Sodium: 138 mmol/L (ref 134–144)
Total Protein: 7.2 g/dL (ref 6.0–8.5)

## 2015-07-21 LAB — THYROID PANEL WITH TSH
Free Thyroxine Index: 2.4 (ref 1.2–4.9)
T3 UPTAKE RATIO: 27 % (ref 24–39)
T4 TOTAL: 9 ug/dL (ref 4.5–12.0)
TSH: 1.38 u[IU]/mL (ref 0.450–4.500)

## 2015-07-21 LAB — VITAMIN D 25 HYDROXY (VIT D DEFICIENCY, FRACTURES): VIT D 25 HYDROXY: 25.5 ng/mL — AB (ref 30.0–100.0)

## 2015-07-21 LAB — LIPID PANEL
Chol/HDL Ratio: 4.8 ratio units — ABNORMAL HIGH (ref 0.0–4.4)
Cholesterol, Total: 281 mg/dL — ABNORMAL HIGH (ref 100–199)
HDL: 58 mg/dL (ref >39)
LDL Calculated: 185 mg/dL — ABNORMAL HIGH (ref 0–99)
Triglycerides: 190 mg/dL — ABNORMAL HIGH (ref 0–149)
VLDL CHOLESTEROL CAL: 38 mg/dL (ref 5–40)

## 2015-07-23 ENCOUNTER — Encounter: Payer: Medicare Other | Admitting: Obstetrics and Gynecology

## 2015-07-25 ENCOUNTER — Other Ambulatory Visit: Payer: Self-pay | Admitting: Family Medicine

## 2015-07-25 ENCOUNTER — Ambulatory Visit (INDEPENDENT_AMBULATORY_CARE_PROVIDER_SITE_OTHER): Payer: Medicare Other

## 2015-07-25 ENCOUNTER — Ambulatory Visit (INDEPENDENT_AMBULATORY_CARE_PROVIDER_SITE_OTHER): Payer: Medicare Other | Admitting: Family Medicine

## 2015-07-25 ENCOUNTER — Encounter: Payer: Self-pay | Admitting: Family Medicine

## 2015-07-25 VITALS — BP 97/65 | HR 87 | Temp 96.8°F | Ht 64.0 in | Wt 159.0 lb

## 2015-07-25 DIAGNOSIS — M5441 Lumbago with sciatica, right side: Secondary | ICD-10-CM

## 2015-07-25 DIAGNOSIS — K5901 Slow transit constipation: Secondary | ICD-10-CM

## 2015-07-25 MED ORDER — POLYETHYLENE GLYCOL 3350 17 GM/SCOOP PO POWD: 17.0000 g | Freq: Two times a day (BID) | ORAL | Status: DC | PRN

## 2015-07-25 MED ORDER — BETAMETHASONE SOD PHOS & ACET 6 (3-3) MG/ML IJ SUSP
6.0000 mg | Freq: Once | INTRAMUSCULAR | Status: AC
  Administered 2015-07-25: 6 mg via INTRAMUSCULAR

## 2015-07-25 MED ORDER — PREGABALIN 50 MG PO CAPS: ORAL_CAPSULE | ORAL | Status: DC

## 2015-07-25 NOTE — Progress Notes (Signed)
Subjective:  Patient ID: Margaret Barker, female    DOB: May 05, 1971  Age: 44 y.o. MRN: [id number]  CC: Sciatica   HPI Margaret Barker presents for sciatica getting worse. Flaring now.8/10 pain for a week. Worse with certain movements. Can't walk much. INterferes with sleep due to pain. Pain radiates from right paraspinous area at l4-5 into right lateral hip into the inguinal area. Recently had increase of cymbalta, started on estrace. Neither has made a change in sx. However, thee is no longer radiation into the thigh or calf. Instead it seems to be radiating more to the front of the thigh into the inguinal region as mentioned above. Flexeril didn't seem to help.  History Margaret Barker has a past medical history of Alcoholism ([place]); Bipolar disorder ([place]); Anxiety; Depression; Panic attacks; Frequency of urination; SUI (stress urinary incontinence, female); Nocturia; Hyperlipidemia; History of panic attacks; GERD (gastroesophageal reflux disease); Helicobacter pylori gastritis; Esophagitis; Erosion of vaginal wall due to surgical mesh; and Wears glasses.   She has past surgical history that includes [place] (08-09-2009 DR Gaynelle Arabian); LEFT BREAST EXCISIONAL BX (03-26-2007); Laparoscopic assisted vaginal hysterectomy (10-22-2006); RIGHT EXCISIONAL BREAST BX (10-23-2005); Cholecystectomy (1992); D & C HYSTEROSCOPY W/ POLYP REMOVAL (04-22-2006); Lesion removal (07/09/2012); Tubal ligation; Pubovaginal sling (N/A, 04/24/2014); Cystoscopy (N/A, 04/24/2014); Abdominal hysterectomy; Colonoscopy (N/A, 10/23/2014); and Esophagogastroduodenoscopy (egd) with propofol (N/A, 10/23/2014).   Her family history includes COPD in her father; Cervical cancer in her paternal grandmother; Colitis in her father and sister; Colon cancer in her paternal uncle; Diabetes in her mother; Heart disease in her father; Hypertension in her father; Liver  cancer in her paternal uncle.She reports that she has been smoking Cigarettes.  She has a 24 pack-year smoking history. She has never used smokeless tobacco. She reports that she uses illicit drugs (Marijuana). She reports that she does not drink alcohol.  Outpatient Prescriptions Prior to Visit  Medication Sig Dispense Refill  . busPIRone (BUSPAR) 10 MG tablet Take 10 mg by mouth 3 (three) times daily.     . cyclobenzaprine (FLEXERIL) 10 MG tablet Take 1 tablet (10 mg total) by mouth 3 (three) times daily as needed for muscle spasms. 90 tablet 1  . DULoxetine (CYMBALTA) 30 MG capsule Take 30 mg by mouth daily. Three tablets once a day    . estradiol (ESTRACE) 1 MG tablet Take 21 days then off 7 days. 21 tablet 6  . glycopyrrolate (ROBINUL) 2 MG tablet Take 1 tablet (2 mg total) by mouth 2 (two) times daily. 60 tablet 3  . ranitidine (ZANTAC) 150 MG tablet TAKE ONE TABLET BY MOUTH TWICE DAILY 60 tablet 0  . solifenacin (VESICARE) 10 MG tablet Take 10 mg by mouth every morning.     . traZODone (DESYREL) 100 MG tablet Take 200 mg by mouth at bedtime.     . valACYclovir (VALTREX) 1000 MG tablet TAKE ONE TABLET BY MOUTH TWICE DAILY 60 tablet 2  . QUEtiapine (SEROQUEL) 100 MG tablet      Facility-Administered Medications Prior to Visit  Medication Dose Route Frequency Provider Last Rate Last Dose  . bupivacaine-EPINEPHrine (MARCAINE W/ EPI) 0.25 % (with pres) injection 20 mL  20 mL Infiltration Once Carolan Clines, MD        ROS Review of Systems  Constitutional: Negative for fever, activity change and appetite change.  HENT: Negative for congestion, rhinorrhea and sore throat.   Eyes: Negative for pain and visual disturbance.  Respiratory: Negative for cough and shortness of breath.   Gastrointestinal: Negative for nausea and abdominal pain.  Musculoskeletal: Positive for myalgias, back pain and arthralgias.    Objective:  BP 97/65 mmHg  Pulse 87  Temp(Src) 96.8 F (36 C) (Oral)   Ht 5\' 4"  (1.626 m)  Wt 159 lb (72.122 kg)  BMI 27.28 kg/m2  BP Readings from Last 3 Encounters:  07/25/15 97/65  07/20/15 114/76  07/06/15 112/81    Wt Readings from Last 3 Encounters:  07/25/15 159 lb (72.122 kg)  07/20/15 156 lb 6.4 oz (70.943 kg)  07/06/15 154 lb (69.854 kg)     Physical Exam  Constitutional: She is oriented to person, place, and time. She appears well-developed and well-nourished. No distress.  HENT:  Head: Normocephalic and atraumatic.  Eyes: Conjunctivae are normal. Pupils are equal, round, and reactive to light.  Neck: Normal range of motion. Neck supple. No thyromegaly present.  Cardiovascular: Normal rate, regular rhythm and normal heart sounds.   No murmur heard. Pulmonary/Chest: Effort normal and breath sounds normal. No respiratory distress. She has no wheezes. She has no rales.  Abdominal: Soft. Bowel sounds are normal. She exhibits no distension. There is no tenderness.  Musculoskeletal: Normal range of motion. She exhibits tenderness (ight L4-5 region paraspinous musculature).  Marked positive straight leg raise on the right  Lymphadenopathy:    She has no cervical adenopathy.  Neurological: She is alert and oriented to person, place, and time. No cranial nerve deficit.  Skin: Skin is warm and dry.    No results found for: HGBA1C  Lab Results  Component Value Date   WBC 8.4 07/20/2015   HGB 14.0 09/19/2014   HCT 42.3 07/20/2015   PLT 243 09/19/2014   GLUCOSE 74 07/20/2015   CHOL 281* 07/20/2015   TRIG 190* 07/20/2015   HDL 58 07/20/2015   LDLCALC 185* 07/20/2015   ALT 32 07/20/2015   AST 23 07/20/2015   NA 138 07/20/2015   K 4.3 07/20/2015   CL 98 07/20/2015   CREATININE 0.82 07/20/2015   BUN 10 07/20/2015   CO2 25 07/20/2015   TSH 1.380 07/20/2015    No results found.  Assessment & Plan:   Margaret Barker was seen today for sciatica.  Diagnoses and all orders for this visit:  Right-sided low back pain with right-sided  sciatica -     DG Lumbar Spine 2-3 Views; Future -     Cancel: DG HIP UNILAT WITH PELVIS MIN 4 VIEWS RIGHT; Future -     betamethasone acetate-betamethasone sodium phosphate (CELESTONE) injection 6 mg; Inject 1 mL (6 mg total) into the muscle once. -     MR Lumbar Spine Wo Contrast; Future -     Ambulatory referral to Physical Therapy  Slow transit constipation  Other orders -     pregabalin (LYRICA) 50 MG capsule; 1 every evening for 3 nights, then 2 daily at bedtime 3 nights then 3 daily at bedtime 3 nights, then 4 daily at bedtime -     polyethylene glycol powder (GLYCOLAX/MIRALAX) powder; Take 17 g by mouth 2 (two) times daily as needed.   I have discontinued Margaret Barker's QUEtiapine. I am also having her start on pregabalin and polyethylene glycol powder. Additionally, I am having her maintain her solifenacin, busPIRone, traZODone, glycopyrrolate, cyclobenzaprine, ranitidine, valACYclovir, estradiol, and DULoxetine. We administered betamethasone acetate-betamethasone sodium phosphate.  Meds ordered this encounter  Medications  . betamethasone acetate-betamethasone sodium phosphate (CELESTONE) injection 6 mg  Sig:   . pregabalin (LYRICA) 50 MG capsule    Sig: 1 every evening for 3 nights, then 2 daily at bedtime 3 nights then 3 daily at bedtime 3 nights, then 4 daily at bedtime    Dispense:  120 capsule    Refill:  0  . polyethylene glycol powder (GLYCOLAX/MIRALAX) powder    Sig: Take 17 g by mouth 2 (two) times daily as needed.    Dispense:  3350 g    Refill:  1   Lumbar spine XR - no remarfable abn in spine, but XS stool noted in colon  Follow-up: Return in about 2 weeks (around 08/08/2015) for Pain.  Claretta Fraise, M.D.

## 2015-07-26 ENCOUNTER — Other Ambulatory Visit: Payer: Self-pay | Admitting: Family

## 2015-07-26 LAB — PAP IG W/ RFLX HPV ASCU: PAP Smear Comment: 0

## 2015-07-26 MED ORDER — VITAMIN D (ERGOCALCIFEROL) 1.25 MG (50000 UNIT) PO CAPS: 50000.0000 [IU] | ORAL_CAPSULE | ORAL | Status: DC

## 2015-07-26 MED ORDER — ATORVASTATIN CALCIUM 40 MG PO TABS: 40.0000 mg | ORAL_TABLET | Freq: Every day | ORAL | Status: DC

## 2015-07-31 ENCOUNTER — Ambulatory Visit: Payer: Medicare Other | Attending: Family Medicine | Admitting: Physical Therapy

## 2015-07-31 ENCOUNTER — Other Ambulatory Visit: Payer: Self-pay

## 2015-07-31 DIAGNOSIS — R6889 Other general symptoms and signs: Secondary | ICD-10-CM | POA: Diagnosis not present

## 2015-07-31 DIAGNOSIS — M5441 Lumbago with sciatica, right side: Secondary | ICD-10-CM | POA: Diagnosis not present

## 2015-07-31 DIAGNOSIS — R198 Other specified symptoms and signs involving the digestive system and abdomen: Secondary | ICD-10-CM | POA: Diagnosis not present

## 2015-07-31 NOTE — Therapy (Signed)
[facility] [place], [place], [address] Phone: [phone]   Fax:  [phone]  Physical Therapy Evaluation  Patient Details  Name: Margaret Barker MRN: [id number] Date of Birth: 1971/08/17 Referring Provider: Claretta Fraise MD  Encounter Date: 07/31/2015      PT End of Session - 07/31/15 1343    Visit Number 1   Number of Visits 12   Date for PT Re-Evaluation 09/11/15   PT Start Time 1302   PT Stop Time 1344   PT Time Calculation (min) 42 min   Activity Tolerance Patient tolerated treatment well   Behavior During Therapy [facility] for tasks assessed/performed      Past Medical History  Diagnosis Date  . Alcoholism ([place])   . Bipolar disorder ([place])   . Anxiety   . Depression   . Panic attacks   . Frequency of urination   . SUI (stress urinary incontinence, female)   . Nocturia   . Hyperlipidemia   . History of panic attacks   . GERD (gastroesophageal reflux disease)   . Helicobacter pylori gastritis     DX  04-13-2014-- TREATED WITH ANTBIOTICS  . Esophagitis   . Erosion of vaginal wall due to surgical mesh   . Wears glasses     Past Surgical History  Procedure Laterality Date  . Sacrospinus anterior culposuspension with uphold mesh and solyx single incision transurethral sling  08-09-2009 DR TANNENBAUM    STRESS INCONTINENCE W/ PELVIC FLOOR PROLAPSE  . Left breast excisional bx  03-26-2007    BENIGN  . Laparoscopic assisted vaginal hysterectomy  10-22-2006  . Right excisional breast bx  10-23-2005    BENIGN  . Cholecystectomy  1992  . D & c hysteroscopy w/ polyp removal  04-22-2006  . Lesion removal  07/09/2012    Procedure: EXCISION VAGINAL LESION;  Surgeon: Ailene Rud, MD;  Location: [facility];  Service: Urology;  Laterality: N/A;  Excision of Vaginal apical cyst  . Tubal ligation    . Pubovaginal sling N/A 04/24/2014    Procedure: EXCISE OF VAGINAL APICAL MESH EXTRUSION;   Surgeon: Ailene Rud, MD;  Location: [facility];  Service: Urology;  Laterality: N/A;  . Cystoscopy N/A 04/24/2014    Procedure: CYSTOSCOPY;  Surgeon: Ailene Rud, MD;  Location: [facility];  Service: Urology;  Laterality: N/A;  . Abdominal hysterectomy    . Colonoscopy N/A 10/23/2014    Procedure: COLONOSCOPY;  Surgeon: Inda Castle, MD;  Location: [facility];  Service: Endoscopy;  Laterality: N/A;  . Esophagogastroduodenoscopy (egd) with propofol N/A 10/23/2014    Procedure: ESOPHAGOGASTRODUODENOSCOPY (EGD) WITH PROPOFOL;  Surgeon: Inda Castle, MD;  Location: [facility];  Service: Endoscopy;  Laterality: N/A;    There were no vitals filed for this visit.  Visit Diagnosis:  Right-sided low back pain with right-sided sciatica - Plan: PT plan of care cert/re-cert  Abdominal weakness - Plan: PT plan of care cert/re-cert  Activity intolerance - Plan: PT plan of care cert/re-cert      Subjective Assessment - 07/31/15 1300    Subjective Patient began experiencing mid back pain with sciatica 6 months ago. She cleans houses for a living. Initially was down her right leg. She had a cortisone injection last week which abolished the leg pain. She now has pain RLB.   Limitations Sitting;Standing;Walking   How long can you sit comfortably? one hour   How long can you stand comfortably?  30 min   How long can you walk comfortably? 30 min   Diagnostic tests xrays, mri ordered   Patient Stated Goals to get rid of the pain   Currently in Pain? Yes   Pain Score 4    Pain Location Back   Pain Orientation Right   Pain Descriptors / Indicators Stabbing   Pain Type Acute pain   Pain Radiating Towards to toes (prior to shot)   Pain Onset More than a month ago   Pain Frequency Intermittent   Aggravating Factors  bending    Pain Relieving Factors nothing   Effect of Pain on Daily Activities has to work with pain            [facility] PT  Assessment - 07/31/15 0001    Assessment   Medical Diagnosis R LBP with sciatica   Referring Provider Claretta Fraise MD   Onset Date/Surgical Date 01/29/15   Next MD Visit 2 weeks   Precautions   Precautions None   Balance Screen   Has the patient fallen in the past 6 months No   Has the patient had a decrease in activity level because of a fear of falling?  No   Is the patient reluctant to leave their home because of a fear of falling?  No   Prior Function   Level of Independence Independent   Vocation Part time employment   Vocation Requirements cleaning houses   Observation/Other Assessments   Focus on Therapeutic Outcomes (FOTO)  48% limited   Posture/Postural Control   Posture Comments increased lumbar lordosis   ROM / Strength   AROM / PROM / Strength AROM;Strength   AROM   AROM Assessment Site Lumbar   Lumbar Flexion full   Lumbar Extension full   Lumbar - Right Side Bend full   Lumbar - Left Side Bend full   Lumbar - Right Rotation 25% decreased   Lumbar - Left Rotation full   Strength   Overall Strength Comments grossly 5/5 BLE   Palpation   Palpation comment R SI, bil QL, R paraspinals   Special Tests    Special Tests --  prone over 1 pillow decreases pain to 2/10                   [facility] Adult PT Treatment/Exercise - 07/31/15 0001    Manual Therapy   Manual Therapy Manual Traction   Manual therapy comments manual traction abolished all pain   Manual Traction hooklying with belt around lower legs and PT; Multiple reps                PT Education - 07/31/15 1347    Education provided Yes   Education Details ADL modifications, avoiding flexion, hep   Person(s) Educated Patient   Methods Explanation;Demonstration;Handout   Comprehension Verbalized understanding;Returned demonstration          PT Short Term Goals - 07/31/15 1352    PT SHORT TERM GOAL #1   Title I with initial HEP 08/14/15   Time 2   Period Weeks   Status New            PT Long Term Goals - 07/31/15 1353    PT LONG TERM GOAL #1   Title I with advanced hep   Time 6   Period Weeks   Status New   PT LONG TERM GOAL #2   Title able to perform ADLS without back pain.   Time 6  Period Weeks   Status New   PT LONG TERM GOAL #3   Title no report of LE sx with ADLS   Time 6   Period Weeks   Status New   PT LONG TERM GOAL #4   Title able to verbalize and/or demonstrate body mechanic techniques to prevent further injury   Time 6   Period Weeks   Status New               Plan - 08/10/15 1347    Clinical Impression Statement Patient is a 44 year old female with insidious onset of LBP with sciatica 6 months ago. RLE pain has been abolished with cortisone shot. She is able to decrease pain with prone over one pillow and pain was abolished with manual traction. She will benefit from core strengthening.   Pt will benefit from skilled therapeutic intervention in order to improve on the following deficits Decreased range of motion;Pain;Decreased activity tolerance;Improper body mechanics;Decreased strength   Rehab Potential Excellent   PT Frequency 2x / week   PT Duration 6 weeks   PT Treatment/Interventions ADLs/Self Care Home Management;Cryotherapy;Electrical Stimulation;Moist Heat;Therapeutic exercise;Therapeutic activities;Ultrasound;Neuromuscular re-education;Patient/family education;Manual techniques;Traction   PT Next Visit Plan core strengthening, functional strengthening, mckenzie protocol, manual traction/traction prn, modalities prn   PT Home Exercise Plan prone over pillow or POE 3-4x per day   Consulted and Agree with Plan of Care Patient          G-Codes - 08/10/15 1355    Functional Assessment Tool Used FOTO 48% LIMITED   Functional Limitation Mobility: Walking and moving around   Mobility: Walking and Moving Around Current Status ([id number]) At least 40 percent but less than 60 percent impaired, limited or restricted    Mobility: Walking and Moving Around Goal Status [id number]) At least 20 percent but less than 40 percent impaired, limited or restricted       Problem List Patient Active Problem List   Diagnosis Date Noted  . GAD (generalized anxiety disorder) 07/20/2015  . Insomnia 07/20/2015  . Internal hemorrhoids without complication 61/53/7943  . Prolapse of vaginal wall with midline cystocele 04/24/2014  . Abdominal pain, generalized 11/18/2010  . MIXED INCONTINENCE URGE AND STRESS 03/21/2009  . Major depressive disorder, recurrent episode ([place]) 12/10/2006  . PANIC ATTACKS 12/10/2006  . POST TRAUMATIC STRESS DISORDER 12/10/2006  . GASTROESOPHAGEAL REFLUX, NO ESOPHAGITIS 12/10/2006    Madelyn Flavors PT  08-10-15, 3:42 PM  [facility] [place], [place], [address] Phone: [phone]   Fax:  [phone]  Name: Margaret Barker MRN: [id number] Date of Birth: [birth year]

## 2015-07-31 NOTE — Patient Instructions (Signed)
Brushing Teeth    Place one foot on ledge and one hand on counter. Bend other knee slightly to keep back straight.  Copyright  VHI. All rights reserved.  Refrigerator   Squat with knees apart to reach lower shelves and drawers.   Copyright  VHI. All rights reserved.  Laundry [employer] down and hold basket close to stand. Use leg muscles to do the work.   Copyright  VHI. All rights reserved.  Housework - Vacuuming   Hold the vacuum with arm held at side. Step back and forth to move it, keeping head up. Avoid twisting.   Copyright  VHI. All rights reserved.  Housework - Wiping   Position yourself as close as possible to reach work surface. Avoid straining your back.   Copyright  VHI. All rights reserved.  Gardening - Mowing   Keep arms close to sides and walk with lawn mower.   Copyright  VHI. All rights reserved.  Sleeping on Side   Place pillow between knees. Use cervical support under neck and a roll around waist as needed.   Copyright  VHI. All rights reserved.  Log Roll   Lying on back, bend left knee and place left arm across chest. Roll all in one movement to the right. Reverse to roll to the left. Always move as one unit.   Copyright  VHI. All rights reserved.  Stand to Sit / Sit to Stand   To sit: Bend knees to lower self onto front edge of chair, then scoot back on seat. To stand: Reverse sequence by placing one foot forward, and scoot to front of seat. Use rocking motion to stand up.  Copyright  VHI. All rights reserved.  Posture - Standing   Good posture is important. Avoid slouching and forward head thrust. Maintain curve in low back and align ears over shoul- ders, hips over ankles.   Copyright  VHI. All rights reserved.  Posture - Sitting   Sit upright, head facing forward. Try using a roll to support lower back. Keep shoulders relaxed, and avoid rounded back. Keep hips level with knees. Avoid crossing legs for long  periods.   Copyright  VHI. All rights reserved.  Computer Work   Position work to Programmer, multimedia. Use proper work and seat height. Keep shoulders back and down, wrists straight, and elbows at right angles. Use chair that provides full back support. Add footrest and lumbar roll as needed.   Copyright  VHI. All rights reserved.   PRONE LYING OR PRONE ON ELBOWS:  LAY ON YOUR STOMACH OVER  A PILLOW FOR 5 MIN EVERY FEW HOURS TO TRY TO GET RID OF THE PAIN OR AT LEAST LESSEN IT. OR PROP UP ON ELBOWS.  Margaret Barker, PT 07/31/2015 1:41 PM [facility] [address] [place], [place], [address] Phone: [phone]   Fax:  [phone]

## 2015-08-06 ENCOUNTER — Ambulatory Visit: Payer: Medicare Other | Admitting: Physical Therapy

## 2015-08-06 DIAGNOSIS — M5441 Lumbago with sciatica, right side: Secondary | ICD-10-CM | POA: Diagnosis not present

## 2015-08-06 DIAGNOSIS — R198 Other specified symptoms and signs involving the digestive system and abdomen: Secondary | ICD-10-CM | POA: Diagnosis not present

## 2015-08-06 DIAGNOSIS — R6889 Other general symptoms and signs: Secondary | ICD-10-CM | POA: Diagnosis not present

## 2015-08-06 NOTE — Therapy (Signed)
[facility] [place], [place], [address] Phone: [phone]   Fax:  [phone]  Physical Therapy Treatment  Patient Details  Name: Margaret Barker MRN: [id number] Date of Birth: 10-Feb-1971 Referring Provider: Claretta Fraise MD  Encounter Date: 08/06/2015      PT End of Session - 08/06/15 0812    Visit Number 2   Number of Visits 12   Date for PT Re-Evaluation 09/11/15   PT Start Time 0814   PT Stop Time 0912   PT Time Calculation (min) 58 min   Activity Tolerance Patient tolerated treatment well   Behavior During Therapy [facility] for tasks assessed/performed      Past Medical History  Diagnosis Date  . Alcoholism ([place])   . Bipolar disorder ([place])   . Anxiety   . Depression   . Panic attacks   . Frequency of urination   . SUI (stress urinary incontinence, female)   . Nocturia   . Hyperlipidemia   . History of panic attacks   . GERD (gastroesophageal reflux disease)   . Helicobacter pylori gastritis     DX  04-13-2014-- TREATED WITH ANTBIOTICS  . Esophagitis   . Erosion of vaginal wall due to surgical mesh   . Wears glasses     Past Surgical History  Procedure Laterality Date  . Sacrospinus anterior culposuspension with uphold mesh and solyx single incision transurethral sling  08-09-2009 DR TANNENBAUM    STRESS INCONTINENCE W/ PELVIC FLOOR PROLAPSE  . Left breast excisional bx  03-26-2007    BENIGN  . Laparoscopic assisted vaginal hysterectomy  10-22-2006  . Right excisional breast bx  10-23-2005    BENIGN  . Cholecystectomy  1992  . D & c hysteroscopy w/ polyp removal  04-22-2006  . Lesion removal  07/09/2012    Procedure: EXCISION VAGINAL LESION;  Surgeon: Ailene Rud, MD;  Location: [facility];  Service: Urology;  Laterality: N/A;  Excision of Vaginal apical cyst  . Tubal ligation    . Pubovaginal sling N/A 04/24/2014    Procedure: EXCISE OF VAGINAL APICAL MESH EXTRUSION;   Surgeon: Ailene Rud, MD;  Location: [facility];  Service: Urology;  Laterality: N/A;  . Cystoscopy N/A 04/24/2014    Procedure: CYSTOSCOPY;  Surgeon: Ailene Rud, MD;  Location: [facility];  Service: Urology;  Laterality: N/A;  . Abdominal hysterectomy    . Colonoscopy N/A 10/23/2014    Procedure: COLONOSCOPY;  Surgeon: Inda Castle, MD;  Location: [facility];  Service: Endoscopy;  Laterality: N/A;  . Esophagogastroduodenoscopy (egd) with propofol N/A 10/23/2014    Procedure: ESOPHAGOGASTRODUODENOSCOPY (EGD) WITH PROPOFOL;  Surgeon: Inda Castle, MD;  Location: [facility];  Service: Endoscopy;  Laterality: N/A;    There were no vitals filed for this visit.  Visit Diagnosis:  Right-sided low back pain with right-sided sciatica  Abdominal weakness      Subjective Assessment - 08/06/15 0815    Currently in Pain? Yes   Pain Score 2    Pain Location Back   Pain Orientation Right   Pain Descriptors / Indicators Stabbing   Pain Radiating Towards to the left side   Pain Onset More than a month ago   Pain Frequency Intermittent   Aggravating Factors  bending   Pain Relieving Factors nothing   Effect of Pain on Daily Activities has to work with pain  [facility] Adult PT Treatment/Exercise - 08/06/15 0001    Exercises   Exercises Lumbar   Lumbar Exercises: Stretches   Press Ups --  10 reps   Quadruped Mid Back Stretch 3 reps;30 seconds  straight, left and right   Lumbar Exercises: Supine   Ab Set 5 reps;5 seconds   Clam 20 reps   Bridge 5 seconds;20 reps   Lumbar Exercises: Prone   Straight Leg Raise 10 reps  bil   Other Prone Lumbar Exercises pelvic press 5 sec hold x 5   Lumbar Exercises: Quadruped   [place] 10 reps   Modalities   Modalities Electrical Stimulation   Electrical Stimulation   Electrical Stimulation Location lumbar   Electrical Stimulation Action IFC    Electrical Stimulation Parameters 80-150 hz x 15 min to tolerance   Electrical Stimulation Goals Tone;Pain   Manual Therapy   Manual Therapy Joint mobilization   Joint Mobilization UPA and CPA mobs gd 1-3 L1-5  Left L1-4 tight, L4 R painful                PT Education - 08/06/15 1017    Education provided Yes   Education Details bridge, cat/cow and transverse abdominus with clam   Person(s) Educated Patient   Methods Explanation;Demonstration;Handout   Comprehension Verbalized understanding;Returned demonstration          PT Short Term Goals - 07/31/15 1352    PT SHORT TERM GOAL #1   Title I with initial HEP 08/14/15   Time 2   Period Weeks   Status New           PT Long Term Goals - 07/31/15 1353    PT LONG TERM GOAL #1   Title I with advanced hep   Time 6   Period Weeks   Status New   PT LONG TERM GOAL #2   Title able to perform ADLS without back pain.   Time 6   Period Weeks   Status New   PT LONG TERM GOAL #3   Title no report of LE sx with ADLS   Time 6   Period Weeks   Status New   PT LONG TERM GOAL #4   Title able to verbalize and/or demonstrate body mechanic techniques to prevent further injury   Time 6   Period Weeks   Status New               Plan - 08/06/15 1018    Clinical Impression Statement Patient demos tightness of left paraspinals and QL with PA mobs of lumbar spine and pain at R L4/5. Mobilizations gd 1-3 abolished R back pain. Patient tolerated therex without increased pain, but did have soreness in back after mobs. She responded well to estim. No goals met as only second visit.   Pt will benefit from skilled therapeutic intervention in order to improve on the following deficits Decreased range of motion;Pain;Decreased activity tolerance;Improper body mechanics;Decreased strength   Rehab Potential Excellent   PT Frequency 2x / week   PT Duration 6 weeks   PT Treatment/Interventions ADLs/Self Care Home  Management;Cryotherapy;Electrical Stimulation;Moist Heat;Therapeutic exercise;Therapeutic activities;Ultrasound;Neuromuscular re-education;Patient/family education;Manual techniques;Traction   PT Next Visit Plan Contnue mobs; Continue core strengthening, functional strengthening, mckenzie protocol, manual traction/traction prn, modalities prn   PT Home Exercise Plan bridge, cat/cow and transverse abdominus with clam   Consulted and Agree with Plan of Care Patient        Problem List Patient Active Problem List  Diagnosis Date Noted  . GAD (generalized anxiety disorder) 07/20/2015  . Insomnia 07/20/2015  . Internal hemorrhoids without complication 00/51/1021  . Prolapse of vaginal wall with midline cystocele 04/24/2014  . Abdominal pain, generalized 11/18/2010  . MIXED INCONTINENCE URGE AND STRESS 03/21/2009  . Major depressive disorder, recurrent episode ([place]) 12/10/2006  . PANIC ATTACKS 12/10/2006  . POST TRAUMATIC STRESS DISORDER 12/10/2006  . GASTROESOPHAGEAL REFLUX, NO ESOPHAGITIS 12/10/2006    Madelyn Flavors PT  08/06/2015, 10:23 AM  [facility] [address] [place], [place], [address] Phone: [phone]   Fax:  [phone]  Name: Margaret Barker MRN: [id number] Date of Birth: [birth year]

## 2015-08-07 ENCOUNTER — Ambulatory Visit: Payer: Medicare Other | Admitting: Physical Therapy

## 2015-08-07 DIAGNOSIS — R6889 Other general symptoms and signs: Secondary | ICD-10-CM | POA: Diagnosis not present

## 2015-08-07 DIAGNOSIS — R198 Other specified symptoms and signs involving the digestive system and abdomen: Secondary | ICD-10-CM | POA: Diagnosis not present

## 2015-08-07 DIAGNOSIS — M5441 Lumbago with sciatica, right side: Secondary | ICD-10-CM

## 2015-08-07 NOTE — Therapy (Signed)
[facility] [place], [place], [address] Phone: [phone]   Fax:  [phone]  Physical Therapy Treatment  Patient Details  Name: Margaret Barker MRN: [id number] Date of Birth: [birth year] Referring Provider: Claretta Fraise MD  Encounter Date: 08/07/2015      PT End of Session - 08/07/15 0826    Visit Number 3   Number of Visits 12   Date for PT Re-Evaluation 09/11/15   PT Start Time 0815   PT Stop Time 0914   PT Time Calculation (min) 59 min   Activity Tolerance Patient tolerated treatment well   Behavior During Therapy [facility] for tasks assessed/performed      Past Medical History  Diagnosis Date  . Alcoholism ([place])   . Bipolar disorder ([place])   . Anxiety   . Depression   . Panic attacks   . Frequency of urination   . SUI (stress urinary incontinence, female)   . Nocturia   . Hyperlipidemia   . History of panic attacks   . GERD (gastroesophageal reflux disease)   . Helicobacter pylori gastritis     DX  04-13-2014-- TREATED WITH ANTBIOTICS  . Esophagitis   . Erosion of vaginal wall due to surgical mesh   . Wears glasses     Past Surgical History  Procedure Laterality Date  . Sacrospinus anterior culposuspension with uphold mesh and solyx single incision transurethral sling  08-09-2009 DR TANNENBAUM    STRESS INCONTINENCE W/ PELVIC FLOOR PROLAPSE  . Left breast excisional bx  03-26-2007    BENIGN  . Laparoscopic assisted vaginal hysterectomy  10-22-2006  . Right excisional breast bx  10-23-2005    BENIGN  . Cholecystectomy  1992  . D & c hysteroscopy w/ polyp removal  04-22-2006  . Lesion removal  07/09/2012    Procedure: EXCISION VAGINAL LESION;  Surgeon: Ailene Rud, MD;  Location: [facility];  Service: Urology;  Laterality: N/A;  Excision of Vaginal apical cyst  . Tubal ligation    . Pubovaginal sling N/A 04/24/2014    Procedure: EXCISE OF VAGINAL APICAL MESH EXTRUSION;   Surgeon: Ailene Rud, MD;  Location: [facility];  Service: Urology;  Laterality: N/A;  . Cystoscopy N/A 04/24/2014    Procedure: CYSTOSCOPY;  Surgeon: Ailene Rud, MD;  Location[facility];  Service: Urology;  Laterality: N/A;  . Abdominal hysterectomy    . Colonoscopy N/A 10/23/2014    Procedure: COLONOSCOPY;  Surgeon: Inda Castle, MD;  Location: [facility];  Service: Endoscopy;  Laterality: N/A;  . Esophagogastroduodenoscopy (egd) with propofol N/A 10/23/2014    Procedure: ESOPHAGOGASTRODUODENOSCOPY (EGD) WITH PROPOFOL;  Surgeon: Inda Castle, MD;  Location: [facility];  Service: Endoscopy;  Laterality: N/A;    There were no vitals filed for this visit.  Visit Diagnosis:  Right-sided low back pain with right-sided sciatica      Subjective Assessment - 08/07/15 0827    Subjective Patient reports increased pain last night. She had no pain after yesterday's treatment, however she worked yesterday following treatment and reports changing beds in addition to her normal cleaning. She reports pain with sitting, but standing is okay. She was still able to abolish pain with prone lying over pillow, but pain returns after one hour. She also stated that this morning her Left foot wants to drag. Patient to have MRI 08/17/15.   Currently in Pain? Yes   Pain Score 6    Pain Location  Back   Pain Orientation Right   Pain Descriptors / Indicators Stabbing   Pain Type Acute pain   Pain Radiating Towards right SI area   Pain Onset More than a month ago   Aggravating Factors  bending, sitting on her couch   Pain Relieving Factors prone over one pillow   Effect of Pain on Daily Activities has pain with ADLS                         [facility] Adult PT Treatment/Exercise - 08/07/15 0001    Self-Care   Self-Care ADL's;Other Self-Care Comments   ADL's reviewed correct way to change beds by either kneeling or squatting with good form;  discussed sititng options other than sofa; avoiding twisting with ADLS   Other Self-Care Comments  reviewed anatomy of disc and nerves using spine model   Lumbar Exercises: Prone   Other Prone Lumbar Exercises prone lying after traction abolishes left sided lumbar pain; POE increases pain in R lumbar to 5/10, back to prone 4/10;    Modalities   Modalities Electrical Stimulation;Traction   Acupuncturist Location lumbar   Electrical Stimulation Action IFC   Electrical Stimulation Parameters 80-150 hz to tolerance x 15 min   Electrical Stimulation Goals Tone;Pain   Traction   Type of Traction Lumbar   Min (lbs) 5   Max (lbs) 40   Hold Time 99   Rest Time 5   Time 15   Manual Therapy   Manual Therapy Manual Traction   Manual Traction hooklying with belt around lower legs and PT; Multiple reps                PT Education - 08/06/15 1017    Education provided Yes   Education Details bridge, cat/cow and transverse abdominus with clam   Person(s) Educated Patient   Methods Explanation;Demonstration;Handout   Comprehension Verbalized understanding;Returned demonstration          PT Short Term Goals - 07/31/15 1352    PT SHORT TERM GOAL #1   Title I with initial HEP 08/14/15   Time 2   Period Weeks   Status New           PT Long Term Goals - 07/31/15 1353    PT LONG TERM GOAL #1   Title I with advanced hep   Time 6   Period Weeks   Status New   PT LONG TERM GOAL #2   Title able to perform ADLS without back pain.   Time 6   Period Weeks   Status New   PT LONG TERM GOAL #3   Title no report of LE sx with ADLS   Time 6   Period Weeks   Status New   PT LONG TERM GOAL #4   Title able to verbalize and/or demonstrate body mechanic techniques to prevent further injury   Time 6   Period Weeks   Status New               Plan - 08/07/15 0900    Clinical Impression Statement Patient had increased pain upon arrival  today, so PT held lumbar spinal mobs. Patient responded well to mechanical traction cutting pain in half and reporting that she is walking better. Manual traction decreased pain to 2/10 and after estim in prone over one pillow, patient had 0/10 pain when she left.  Goals are ongoing.   Pt will benefit  from skilled therapeutic intervention in order to improve on the following deficits Decreased range of motion;Pain;Decreased activity tolerance;Improper body mechanics;Decreased strength   Rehab Potential Excellent   PT Frequency 2x / week   PT Duration 6 weeks   PT Treatment/Interventions ADLs/Self Care Home Management;Cryotherapy;Electrical Stimulation;Moist Heat;Therapeutic exercise;Therapeutic activities;Ultrasound;Neuromuscular re-education;Patient/family education;Manual techniques;Traction   PT Next Visit Plan Contnue mobs; Continue core strengthening, functional strengthening, mckenzie protocol, manual traction/traction prn, modalities prn   PT Home Exercise Plan bridge, cat/cow and transverse abdominus with clam   Consulted and Agree with Plan of Care Patient        Problem List Patient Active Problem List   Diagnosis Date Noted  . GAD (generalized anxiety disorder) 07/20/2015  . Insomnia 07/20/2015  . Internal hemorrhoids without complication 70/76/1518  . Prolapse of vaginal wall with midline cystocele 04/24/2014  . Abdominal pain, generalized 11/18/2010  . MIXED INCONTINENCE URGE AND STRESS 03/21/2009  . Major depressive disorder, recurrent episode ([place]) 12/10/2006  . PANIC ATTACKS 12/10/2006  . POST TRAUMATIC STRESS DISORDER 12/10/2006  . GASTROESOPHAGEAL REFLUX, NO ESOPHAGITIS 12/10/2006    Madelyn Flavors PT  08/07/2015, 12:52 PM  [facility] [place], [place], [address] Phone: [phone]   Fax:  [phone]  Name: SHANEICE BARSANTI MRN: [id number] Date of Birth: 1971-08-02

## 2015-08-08 ENCOUNTER — Ambulatory Visit (INDEPENDENT_AMBULATORY_CARE_PROVIDER_SITE_OTHER): Payer: Medicare Other | Admitting: Family Medicine

## 2015-08-08 ENCOUNTER — Encounter: Payer: Self-pay | Admitting: Family Medicine

## 2015-08-08 VITALS — BP 106/74 | HR 93 | Temp 97.0°F | Ht 64.0 in | Wt 158.6 lb

## 2015-08-08 DIAGNOSIS — K5901 Slow transit constipation: Secondary | ICD-10-CM

## 2015-08-08 DIAGNOSIS — M533 Sacrococcygeal disorders, not elsewhere classified: Secondary | ICD-10-CM | POA: Diagnosis not present

## 2015-08-08 MED ORDER — PREGABALIN 300 MG PO CAPS: ORAL_CAPSULE | ORAL | Status: DC

## 2015-08-08 MED ORDER — LINACLOTIDE 290 MCG PO CAPS: 290.0000 ug | ORAL_CAPSULE | Freq: Every day | ORAL | Status: DC

## 2015-08-08 NOTE — Progress Notes (Signed)
Subjective:  Patient ID: Margaret Barker, female    DOB: Jan 11, 1971  Age: 44 y.o. MRN: [id number]  CC: Back Pain   HPI Margaret Barker presents for Sharp pain with driving, pushing pedals - switching from gas to brake. When sitting feels jolt - sharp jab.5/10. Right lower back laterally at hip bone.  intermittently. Physical therapy ehelping.  History Margaret Barker has a past medical history of Alcoholism ([place]); Bipolar disorder ([place]); Anxiety; Depression; Panic attacks; Frequency of urination; SUI (stress urinary incontinence, female); Nocturia; Hyperlipidemia; History of panic attacks; GERD (gastroesophageal reflux disease); Helicobacter pylori gastritis; Esophagitis; Erosion of vaginal wall due to surgical mesh; and Wears glasses.   She has past surgical history that includes [place] (08-09-2009 DR Gaynelle Arabian); LEFT BREAST EXCISIONAL BX (03-26-2007); Laparoscopic assisted vaginal hysterectomy (10-22-2006); RIGHT EXCISIONAL BREAST BX (10-23-2005); Cholecystectomy (1992); D & C HYSTEROSCOPY W/ POLYP REMOVAL (04-22-2006); Lesion removal (07/09/2012); Tubal ligation; Pubovaginal sling (N/A, 04/24/2014); Cystoscopy (N/A, 04/24/2014); Abdominal hysterectomy; Colonoscopy (N/A, 10/23/2014); and Esophagogastroduodenoscopy (egd) with propofol (N/A, 10/23/2014).   Her family history includes COPD in her father; Cervical cancer in her paternal grandmother; Colitis in her father and sister; Colon cancer in her paternal uncle; Diabetes in her mother; Heart disease in her father; Hypertension in her father; Liver cancer in her paternal uncle.She reports that she has been smoking Cigarettes.  She has a 24 pack-year smoking history. She has never used smokeless tobacco. She reports that she uses illicit drugs (Marijuana). She reports that she does not drink alcohol.  Outpatient Prescriptions Prior to Visit  Medication Sig Dispense Refill   . atorvastatin (LIPITOR) 40 MG tablet Take 1 tablet (40 mg total) by mouth daily. 90 tablet 3  . busPIRone (BUSPAR) 10 MG tablet Take 10 mg by mouth 3 (three) times daily.     . DULoxetine (CYMBALTA) 30 MG capsule Take 30 mg by mouth daily. Three tablets once a day    . estradiol (ESTRACE) 1 MG tablet Take 21 days then off 7 days. 21 tablet 6  . glycopyrrolate (ROBINUL) 2 MG tablet Take 1 tablet (2 mg total) by mouth 2 (two) times daily. 60 tablet 3  . polyethylene glycol powder (GLYCOLAX/MIRALAX) powder Take 17 g by mouth 2 (two) times daily as needed. 3350 g 1  . ranitidine (ZANTAC) 150 MG tablet TAKE ONE TABLET BY MOUTH TWICE DAILY 60 tablet 0  . solifenacin (VESICARE) 10 MG tablet Take 10 mg by mouth every morning.     . traZODone (DESYREL) 100 MG tablet Take 200 mg by mouth at bedtime.     . valACYclovir (VALTREX) 1000 MG tablet TAKE ONE TABLET BY MOUTH TWICE DAILY 60 tablet 2  . Vitamin D, Ergocalciferol, (DRISDOL) 50000 UNITS CAPS capsule Take 1 capsule (50,000 Units total) by mouth every 7 (seven) days. 12 capsule 3  . pregabalin (LYRICA) 50 MG capsule 1 every evening for 3 nights, then 2 daily at bedtime 3 nights then 3 daily at bedtime 3 nights, then 4 daily at bedtime 120 capsule 0  . cyclobenzaprine (FLEXERIL) 10 MG tablet Take 1 tablet (10 mg total) by mouth 3 (three) times daily as needed for muscle spasms. (Patient not taking: Reported on 07/31/2015) 90 tablet 1   Facility-Administered Medications Prior to Visit  Medication Dose Route Frequency Provider Last Rate Last Dose  . bupivacaine-EPINEPHrine (MARCAINE W/ EPI) 0.25 % (with pres) injection 20 mL  20 mL Infiltration Once Carolan Clines, MD  ROS Review of Systems  Constitutional: Negative for fever, chills, diaphoresis, appetite change, fatigue and unexpected weight change.  HENT: Negative for congestion, ear pain, hearing loss, postnasal drip, rhinorrhea, sneezing, sore throat and trouble swallowing.   Eyes:  Negative for pain.  Respiratory: Negative for cough, chest tightness and shortness of breath.   Cardiovascular: Negative for chest pain and palpitations.  Gastrointestinal: Negative for nausea, vomiting, abdominal pain, diarrhea and constipation.  Genitourinary: Negative for dysuria, frequency and menstrual problem.  Musculoskeletal: Positive for myalgias, back pain and arthralgias. Negative for joint swelling.  Skin: Negative for rash.  Neurological: Negative for dizziness, weakness, numbness and headaches.  Psychiatric/Behavioral: Negative for dysphoric mood and agitation.    Objective:  BP 106/74 mmHg  Pulse 93  Temp(Src) 97 F (36.1 C) (Oral)  Ht 5\' 4"  (1.626 m)  Wt 158 lb 9.6 oz (71.94 kg)  BMI 27.21 kg/m2  BP Readings from Last 3 Encounters:  08/08/15 106/74  07/25/15 97/65  07/20/15 114/76    Wt Readings from Last 3 Encounters:  08/08/15 158 lb 9.6 oz (71.94 kg)  07/25/15 159 lb (72.122 kg)  07/20/15 156 lb 6.4 oz (70.943 kg)     Physical Exam  Constitutional: She is oriented to person, place, and time. She appears well-developed and well-nourished. No distress.  HENT:  Head: Normocephalic and atraumatic.  Right Ear: External ear normal.  Left Ear: External ear normal.  Nose: Nose normal.  Mouth/Throat: Oropharynx is clear and moist.  Eyes: Conjunctivae and EOM are normal. Pupils are equal, round, and reactive to light.  Neck: Normal range of motion. Neck supple. No thyromegaly present.  Cardiovascular: Normal rate, regular rhythm and normal heart sounds.   No murmur heard. Pulmonary/Chest: Effort normal and breath sounds normal. No respiratory distress. She has no wheezes. She has no rales.  Abdominal: Soft. Bowel sounds are normal. She exhibits no distension. There is no tenderness.  Musculoskeletal: Normal range of motion. Tenderness: paraspinous lumbar muscle spasm.  Lymphadenopathy:    She has no cervical adenopathy.  Neurological: She is alert and  oriented to person, place, and time. She has normal reflexes.  Skin: Skin is warm and dry.  Psychiatric: She has a normal mood and affect. Her behavior is normal. Judgment and thought content normal.    No results found for: HGBA1C  Lab Results  Component Value Date   WBC 8.4 07/20/2015   HGB 14.0 09/19/2014   HCT 42.3 07/20/2015   PLT 243 09/19/2014   GLUCOSE 74 07/20/2015   CHOL 281* 07/20/2015   TRIG 190* 07/20/2015   HDL 58 07/20/2015   LDLCALC 185* 07/20/2015   ALT 32 07/20/2015   AST 23 07/20/2015   NA 138 07/20/2015   K 4.3 07/20/2015   CL 98 07/20/2015   CREATININE 0.82 07/20/2015   BUN 10 07/20/2015   CO2 25 07/20/2015   TSH 1.380 07/20/2015    No results found.  Assessment & Plan:   Jahmia was seen today for back pain.  Diagnoses and all orders for this visit:  Sacroiliac joint pain  Slow transit constipation  Other orders -     pregabalin (LYRICA) 300 MG capsule; 1 at bedtime -     Linaclotide (LINZESS) 290 MCG CAPS capsule; Take 1 capsule (290 mcg total) by mouth daily. To treat and prevent constipation   I have changed Ms. Rehfeld's pregabalin. I am also having her start on Linaclotide. Additionally, I am having her maintain her solifenacin, busPIRone, traZODone, glycopyrrolate, cyclobenzaprine,  ranitidine, valACYclovir, estradiol, DULoxetine, polyethylene glycol powder, Vitamin D (Ergocalciferol), and atorvastatin.  Meds ordered this encounter  Medications  . pregabalin (LYRICA) 300 MG capsule    Sig: 1 at bedtime    Dispense:  30 capsule    Refill:  5  . Linaclotide (LINZESS) 290 MCG CAPS capsule    Sig: Take 1 capsule (290 mcg total) by mouth daily. To treat and prevent constipation    Dispense:  30 capsule    Refill:  2     Follow-up: Return in about 2 weeks (around 08/22/2015).  Claretta Fraise, M.D.

## 2015-08-13 ENCOUNTER — Ambulatory Visit: Payer: Medicare Other | Admitting: Physical Therapy

## 2015-08-13 ENCOUNTER — Encounter: Payer: Self-pay | Admitting: Physical Therapy

## 2015-08-13 DIAGNOSIS — R6889 Other general symptoms and signs: Secondary | ICD-10-CM | POA: Diagnosis not present

## 2015-08-13 DIAGNOSIS — M5441 Lumbago with sciatica, right side: Secondary | ICD-10-CM

## 2015-08-13 DIAGNOSIS — R198 Other specified symptoms and signs involving the digestive system and abdomen: Secondary | ICD-10-CM

## 2015-08-13 NOTE — Therapy (Signed)
[facility] [place], [place], [address] Phone: [phone]   Fax:  [phone]  Physical Therapy Treatment  Patient Details  Name: Margaret Barker MRN: [id number] Date of Birth: April 27, 1971 Referring Provider: Claretta Fraise MD  Encounter Date: 08/13/2015      PT End of Session - 08/13/15 1117    Visit Number 4   Number of Visits 12   Date for PT Re-Evaluation 09/11/15   PT Start Time 1117   PT Stop Time 1201   PT Time Calculation (min) 44 min   Activity Tolerance Patient tolerated treatment well   Behavior During Therapy [facility] for tasks assessed/performed      Past Medical History  Diagnosis Date  . Alcoholism ([place])   . Bipolar disorder ([place])   . Anxiety   . Depression   . Panic attacks   . Frequency of urination   . SUI (stress urinary incontinence, female)   . Nocturia   . Hyperlipidemia   . History of panic attacks   . GERD (gastroesophageal reflux disease)   . Helicobacter pylori gastritis     DX  04-13-2014-- TREATED WITH ANTBIOTICS  . Esophagitis   . Erosion of vaginal wall due to surgical mesh   . Wears glasses     Past Surgical History  Procedure Laterality Date  . Sacrospinus anterior culposuspension with uphold mesh and solyx single incision transurethral sling  08-09-2009 DR TANNENBAUM    STRESS INCONTINENCE W/ PELVIC FLOOR PROLAPSE  . Left breast excisional bx  03-26-2007    BENIGN  . Laparoscopic assisted vaginal hysterectomy  10-22-2006  . Right excisional breast bx  10-23-2005    BENIGN  . Cholecystectomy  1992  . D & c hysteroscopy w/ polyp removal  04-22-2006  . Lesion removal  07/09/2012    Procedure: EXCISION VAGINAL LESION;  Surgeon: Ailene Rud, MD;  [facility];  Service: Urology;  Laterality: N/A;  Excision of Vaginal apical cyst  . Tubal ligation    . Pubovaginal sling N/A 04/24/2014    Procedure: EXCISE OF VAGINAL APICAL MESH EXTRUSION;   Surgeon: Ailene Rud, MD;  Location: [facility];  Service: Urology;  Laterality: N/A;  . Cystoscopy N/A 04/24/2014    Procedure: CYSTOSCOPY;  Surgeon: Ailene Rud, MD;  Location[facility]  Service: Urology;  Laterality: N/A;  . Abdominal hysterectomy    . Colonoscopy N/A 10/23/2014    Procedure: COLONOSCOPY;  Surgeon: Inda Castle, MD;  [facility];  Service: Endoscopy;  Laterality: N/A;  . Esophagogastroduodenoscopy (egd) with propofol N/A 10/23/2014    Procedure: ESOPHAGOGASTRODUODENOSCOPY (EGD) WITH PROPOFOL;  Surgeon: Inda Castle, MD;  [facility];  Service: Endoscopy;  Laterality: N/A;    There were no vitals filed for this visit.  Visit Diagnosis:  Right-sided low back pain with right-sided sciatica  Abdominal weakness  Activity intolerance      Subjective Assessment - 08/13/15 1124    Subjective Reports that her back hasn't been bothering her too bad. Reports HEP compliance.   Limitations Sitting;Standing;Walking   How long can you sit comfortably? one hour   How long can you stand comfortably? 30 min   How long can you walk comfortably? 30 min   Diagnostic tests xrays, mri ordered   Patient Stated Goals to get rid of the pain   Currently in Pain? Yes   Pain Score 2    Pain Location Back  [facility] PT Assessment - 08/13/15 0001    Assessment   Medical Diagnosis R LBP with sciatica   Onset Date/Surgical Date 01/29/15   Next MD Visit 2 weeks                     [facility] Adult PT Treatment/Exercise - 08/13/15 0001    Lumbar Exercises: Supine   Ab Set 10 reps;5 seconds   Clam Other (comment)  x30 reps   Bent Knee Raise 20 reps   Bridge 5 seconds;20 reps   Straight Leg Raise 10 reps;3 seconds;Other (comment)  BLE   Modalities   Modalities Retail buyer Location R Low Back   Electrical Stimulation Action IFC    Electrical Stimulation Parameters 80-150 Hz x65min   Electrical Stimulation Goals Tone;Pain   Manual Therapy   Manual Therapy Manual Traction   Manual Traction Patient in supine with belt around PTA and lower legs with posterior weightshift; multiple reps x8 min                PT Education - 08/13/15 1131    Education provided Yes   Education Details HEP- marching with core activation   Person(s) Educated Patient   Methods Explanation;Demonstration;Verbal cues;Handout   Comprehension Verbalized understanding;Returned demonstration;Verbal cues required          PT Short Term Goals - 08/13/15 1147    PT SHORT TERM GOAL #1   Title I with initial HEP 08/14/15   Time 2   Period Weeks   Status Achieved           PT Long Term Goals - 08/13/15 1147    PT LONG TERM GOAL #1   Title I with advanced hep   Time 6   Period Weeks   Status On-going   PT LONG TERM GOAL #2   Title able to perform ADLS without back pain.   Time 6   Period Weeks   Status On-going  2/10 R low back with ADLs   PT LONG TERM GOAL #3   Title no report of LE sx with ADLS   Time 6   Period Weeks   Status On-going  Reports cramping sensation to toes   PT LONG TERM GOAL #4   Title able to verbalize and/or demonstrate body mechanic techniques to prevent further injury   Time 6   Period Weeks   Status Achieved               Plan - 08/13/15 1154    Clinical Impression Statement Patient tolerated today's treatment and had no complaints of increased pain during any exercises or manual traction. Completed all core exercises well with minimal-moderate multimodal cueing for correct exercise technique as well as verbal cueing for core activation. Tolerated manual lumbar traction with PTA well without complaint of pain during or following manual traction. Achieved ST HEP goal and LT body mechanics goal today in clinic. All other goals remain on-going secondary to pain with ADLs and continued LE  symptoms. Normal stimulation response in prone observed following removal of the stimulation. Accepted new HEP for supine core marching without questions. Experienced 2/10 back pain with low back flexion following today's treatment.   Pt will benefit from skilled therapeutic intervention in order to improve on the following deficits Decreased range of motion;Pain;Decreased activity tolerance;Improper body mechanics;Decreased strength   Rehab Potential Excellent   PT Frequency 2x / week   PT Duration 6  weeks   PT Treatment/Interventions ADLs/Self Care Home Management;Cryotherapy;Electrical Stimulation;Moist Heat;Therapeutic exercise;Therapeutic activities;Ultrasound;Neuromuscular re-education;Patient/family education;Manual techniques;Traction   PT Next Visit Plan Contnue mobs; Continue core strengthening, functional strengthening, mckenzie protocol, manual traction/traction prn, modalities prn   Consulted and Agree with Plan of Care Patient        Problem List Patient Active Problem List   Diagnosis Date Noted  . GAD (generalized anxiety disorder) 07/20/2015  . Insomnia 07/20/2015  . Internal hemorrhoids without complication 00/34/9179  . Prolapse of vaginal wall with midline cystocele 04/24/2014  . Abdominal pain, generalized 11/18/2010  . MIXED INCONTINENCE URGE AND STRESS 03/21/2009  . Major depressive disorder, recurrent episode ([place]) 12/10/2006  . PANIC ATTACKS 12/10/2006  . POST TRAUMATIC STRESS DISORDER 12/10/2006  . GASTROESOPHAGEAL REFLUX, NO ESOPHAGITIS 12/10/2006    Wynelle Fanny, PTA  08/13/2015, 12:12 PM  [facility] [place], [place], [address] Phone: [phone]   Fax:  [phone]  Name: Margaret Barker MRN: [id number] Date of Birth: [birth year]

## 2015-08-13 NOTE — Patient Instructions (Signed)
Bent Leg Lift (Hook-Lying)    Tighten stomach and slowly raise right leg _5___ inches from floor. Keep trunk rigid. Hold _5___ seconds. Repeat __10__ times per set. Do _2___ sets per session. Do _2-3___ sessions per day.  http://orth.exer.us/1090   Copyright  VHI. All rights reserved.

## 2015-08-15 DIAGNOSIS — N63 Unspecified lump in breast: Secondary | ICD-10-CM | POA: Diagnosis not present

## 2015-08-15 DIAGNOSIS — N6011 Diffuse cystic mastopathy of right breast: Secondary | ICD-10-CM | POA: Diagnosis not present

## 2015-08-15 LAB — HM MAMMOGRAPHY

## 2015-08-16 ENCOUNTER — Ambulatory Visit: Payer: Medicare Other | Attending: Family Medicine | Admitting: Physical Therapy

## 2015-08-16 ENCOUNTER — Encounter: Payer: Self-pay | Admitting: Physical Therapy

## 2015-08-16 DIAGNOSIS — R198 Other specified symptoms and signs involving the digestive system and abdomen: Secondary | ICD-10-CM | POA: Diagnosis not present

## 2015-08-16 DIAGNOSIS — R6889 Other general symptoms and signs: Secondary | ICD-10-CM | POA: Diagnosis not present

## 2015-08-16 DIAGNOSIS — M5441 Lumbago with sciatica, right side: Secondary | ICD-10-CM

## 2015-08-16 NOTE — Therapy (Addendum)
[facility] [place], [place], [address] Phone: [phone]   Fax:  [phone]  Physical Therapy Treatment  Patient Details  Name: Margaret Barker MRN: [id number] Date of Birth: 1971/09/15 Referring Provider: Claretta Fraise MD  Encounter Date: 08/16/2015      PT End of Session - 08/16/15 1520    Visit Number 5   Number of Visits 12   Date for PT Re-Evaluation 09/11/15   PT Start Time 1518   PT Stop Time 1603   PT Time Calculation (min) 45 min   Activity Tolerance Patient tolerated treatment well   Behavior During Therapy [facility] for tasks assessed/performed      Past Medical History  Diagnosis Date  . Alcoholism ([place])   . Bipolar disorder ([place])   . Anxiety   . Depression   . Panic attacks   . Frequency of urination   . SUI (stress urinary incontinence, female)   . Nocturia   . Hyperlipidemia   . History of panic attacks   . GERD (gastroesophageal reflux disease)   . Helicobacter pylori gastritis     DX  04-13-2014-- TREATED WITH ANTBIOTICS  . Esophagitis   . Erosion of vaginal wall due to surgical mesh   . Wears glasses     Past Surgical History  Procedure Laterality Date  . Sacrospinus anterior culposuspension with uphold mesh and solyx single incision transurethral sling  08-09-2009 DR TANNENBAUM    STRESS INCONTINENCE W/ PELVIC FLOOR PROLAPSE  . Left breast excisional bx  03-26-2007    BENIGN  . Laparoscopic assisted vaginal hysterectomy  10-22-2006  . Right excisional breast bx  10-23-2005    BENIGN  . Cholecystectomy  1992  . D & c hysteroscopy w/ polyp removal  04-22-2006  . Lesion removal  07/09/2012    Procedure: EXCISION VAGINAL LESION;  Surgeon: Ailene Rud, MD;  Location: [facility];  Service: Urology;  Laterality: N/A;  Excision of Vaginal apical cyst  . Tubal ligation    . Pubovaginal sling N/A 04/24/2014    Procedure: EXCISE OF VAGINAL APICAL MESH EXTRUSION;  Surgeon:  Ailene Rud, MD;  Location[facility];  Service: Urology;  Laterality: N/A;  . Cystoscopy N/A 04/24/2014    Procedure: CYSTOSCOPY;  Surgeon: Ailene Rud, MD;  Location: [facility];  Service: Urology;  Laterality: N/A;  . Abdominal hysterectomy    . Colonoscopy N/A 10/23/2014    Procedure: COLONOSCOPY;  Surgeon: Inda Castle, MD;  Location: [facility];  Service: Endoscopy;  Laterality: N/A;  . Esophagogastroduodenoscopy (egd) with propofol N/A 10/23/2014    Procedure: ESOPHAGOGASTRODUODENOSCOPY (EGD) WITH PROPOFOL;  Surgeon: Inda Castle, MD;  Location: [facility];  Service: Endoscopy;  Laterality: N/A;    There were no vitals filed for this visit.  Visit Diagnosis:  Right-sided low back pain with right-sided sciatica  Abdominal weakness  Activity intolerance      Subjective Assessment - 08/16/15 1516    Subjective Reports increased sharp pains in R low back and buttock since appointment on Monday. Reports that R hip has also been bothering her too.   Limitations Sitting;Standing;Walking   How long can you sit comfortably? one hour   How long can you stand comfortably? 30 min   How long can you walk comfortably? 30 min   Diagnostic tests xrays, mri ordered   Patient Stated Goals to get rid of the pain   Currently in Pain? Yes  Pain Score 5    Pain Location Back   Pain Orientation Right;Lower   Pain Descriptors / Indicators Sharp   Pain Type Acute pain   Pain Onset More than a month ago            [facility] PT Assessment - 08/16/15 0001    Assessment   Medical Diagnosis R LBP with sciatica   Onset Date/Surgical Date 01/29/15   Next MD Visit 2 weeks                     [facility] Adult PT Treatment/Exercise - 08/16/15 0001    Lumbar Exercises: Supine   Ab Set 15 reps;5 seconds   Clam 20 reps   Bridge 20 reps;4 seconds   Lumbar Exercises: Sidelying   Hip Abduction 15 reps;Other (comment)  BLE; "I feel  weaker on the R side."   Lumbar Exercises: Prone   Straight Leg Raise Other (comment)  Attempted but increased pain   Other Prone Lumbar Exercises Prone pressup over 1 pillow x20 reps 3 sec hold   Other Prone Lumbar Exercises Prone thoracic extension x20 reps   Lumbar Exercises: Quadruped   [place] 20 reps   Modalities   Modalities Traction   Traction   Type of Traction Lumbar   Min (lbs) 5   Max (lbs) 45   Hold Time 99   Rest Time 5   Time 15                  PT Short Term Goals - 08/13/15 1147    PT SHORT TERM GOAL #1   Title I with initial HEP 08/14/15   Time 2   Period Weeks   Status Achieved           PT Long Term Goals - 08/13/15 1147    PT LONG TERM GOAL #1   Title I with advanced hep   Time 6   Period Weeks   Status On-going   PT LONG TERM GOAL #2   Title able to perform ADLS without back pain.   Time 6   Period Weeks   Status On-going  2/10 R low back with ADLs   PT LONG TERM GOAL #3   Title no report of LE sx with ADLS   Time 6   Period Weeks   Status On-going  Reports cramping sensation to toes   PT LONG TERM GOAL #4   Title able to verbalize and/or demonstrate body mechanic techniques to prevent further injury   Time 6   Period Weeks   Status Achieved               Plan - 08/16/15 1552    Clinical Impression Statement Patient tolerated today's treatment fairly well and displayed guarding with any movements or transition movements. Completed all core strengthening exercises with core activation as was verballly cued. Lumbar extension and sidelying hip abduction were conducted in efforts to centralize the low back pain with good effects in lowering pain to 3/10 per patient report. Mechanical traction was utilized again with permission from PT with increased weight to further efforts in decreasing nerve irritation in the lumbar spine. Normal traction response noted following end of the traction session. Experienced  "stretched" feeling following today's treatment.   Pt will benefit from skilled therapeutic intervention in order to improve on the following deficits Decreased range of motion;Pain;Decreased activity tolerance;Improper body mechanics;Decreased strength   Rehab Potential Excellent   PT Frequency  2x / week   PT Duration 6 weeks   PT Treatment/Interventions ADLs/Self Care Home Management;Cryotherapy;Electrical Stimulation;Moist Heat;Therapeutic exercise;Therapeutic activities;Ultrasound;Neuromuscular re-education;Patient/family education;Manual techniques;Traction   PT Next Visit Plan Contnue mobs; Continue core strengthening, functional strengthening, mckenzie protocol, manual traction/traction prn, modalities prn   Consulted and Agree with Plan of Care Patient        Problem List Patient Active Problem List   Diagnosis Date Noted  . GAD (generalized anxiety disorder) 07/20/2015  . Insomnia 07/20/2015  . Internal hemorrhoids without complication 02/77/4128  . Prolapse of vaginal wall with midline cystocele 04/24/2014  . Abdominal pain, generalized 11/18/2010  . MIXED INCONTINENCE URGE AND STRESS 03/21/2009  . Major depressive disorder, recurrent episode ([place]) 12/10/2006  . PANIC ATTACKS 12/10/2006  . POST TRAUMATIC STRESS DISORDER 12/10/2006  . GASTROESOPHAGEAL REFLUX, NO ESOPHAGITIS 12/10/2006    Wynelle Fanny, PTA 08/16/2015, 4:15 PM  [facility] [place], [place], [address] Phone: [phone]   Fax:  [phone]  Name: Margaret Barker MRN: [id number] Date of Birth: Aug 30, 1971

## 2015-08-17 ENCOUNTER — Ambulatory Visit (HOSPITAL_COMMUNITY)
Admission: RE | Admit: 2015-08-17 | Discharge: 2015-08-17 | Disposition: A | Discharge status: Home / Self Care | Payer: Medicare Other | Source: Ambulatory Visit | Attending: Family Medicine | Admitting: Family Medicine

## 2015-08-17 DIAGNOSIS — M545 Low back pain: Secondary | ICD-10-CM | POA: Insufficient documentation

## 2015-08-17 DIAGNOSIS — M47816 Spondylosis without myelopathy or radiculopathy, lumbar region: Secondary | ICD-10-CM | POA: Diagnosis not present

## 2015-08-17 DIAGNOSIS — H40033 Anatomical narrow angle, bilateral: Secondary | ICD-10-CM | POA: Diagnosis not present

## 2015-08-17 DIAGNOSIS — M5441 Lumbago with sciatica, right side: Secondary | ICD-10-CM

## 2015-08-17 DIAGNOSIS — M4186 Other forms of scoliosis, lumbar region: Secondary | ICD-10-CM | POA: Diagnosis not present

## 2015-08-17 DIAGNOSIS — H1013 Acute atopic conjunctivitis, bilateral: Secondary | ICD-10-CM | POA: Diagnosis not present

## 2015-08-17 DIAGNOSIS — M4806 Spinal stenosis, lumbar region: Secondary | ICD-10-CM | POA: Insufficient documentation

## 2015-08-20 ENCOUNTER — Other Ambulatory Visit: Payer: Self-pay | Admitting: *Deleted

## 2015-08-20 ENCOUNTER — Telehealth: Payer: Self-pay | Admitting: Family Medicine

## 2015-08-20 ENCOUNTER — Ambulatory Visit: Payer: Medicare Other | Admitting: Physical Therapy

## 2015-08-20 DIAGNOSIS — R6889 Other general symptoms and signs: Secondary | ICD-10-CM | POA: Diagnosis not present

## 2015-08-20 DIAGNOSIS — R198 Other specified symptoms and signs involving the digestive system and abdomen: Secondary | ICD-10-CM | POA: Diagnosis not present

## 2015-08-20 DIAGNOSIS — M5441 Lumbago with sciatica, right side: Secondary | ICD-10-CM | POA: Diagnosis not present

## 2015-08-20 DIAGNOSIS — M4120 Other idiopathic scoliosis, site unspecified: Secondary | ICD-10-CM

## 2015-08-20 NOTE — Therapy (Addendum)
[facility] [place], [place], [address] Phone: [phone]   Fax:  [phone]  Physical Therapy Treatment  Patient Details  Name: Margaret Barker MRN: [id number] Date of Birth: 1970-11-10 Referring Provider: Claretta Fraise MD  Encounter Date: 08/20/2015      PT End of Session - 08/20/15 1142    Visit Number 6   Number of Visits 12   Date for PT Re-Evaluation 09/11/15   PT Start Time 1120   PT Stop Time 1216   PT Time Calculation (min) 56 min   Activity Tolerance Patient tolerated treatment well   Behavior During Therapy [facility] for tasks assessed/performed      Past Medical History  Diagnosis Date  . Alcoholism ([place])   . Bipolar disorder ([place])   . Anxiety   . Depression   . Panic attacks   . Frequency of urination   . SUI (stress urinary incontinence, female)   . Nocturia   . Hyperlipidemia   . History of panic attacks   . GERD (gastroesophageal reflux disease)   . Helicobacter pylori gastritis     DX  04-13-2014-- TREATED WITH ANTBIOTICS  . Esophagitis   . Erosion of vaginal wall due to surgical mesh   . Wears glasses     Past Surgical History  Procedure Laterality Date  . Sacrospinus anterior culposuspension with uphold mesh and solyx single incision transurethral sling  08-09-2009 DR TANNENBAUM    STRESS INCONTINENCE W/ PELVIC FLOOR PROLAPSE  . Left breast excisional bx  03-26-2007    BENIGN  . Laparoscopic assisted vaginal hysterectomy  10-22-2006  . Right excisional breast bx  10-23-2005    BENIGN  . Cholecystectomy  1992  . D & c hysteroscopy w/ polyp removal  04-22-2006  . Lesion removal  07/09/2012    Procedure: EXCISION VAGINAL LESION;  Surgeon: Ailene Rud, MD;  Location: [facility];  Service: Urology;  Laterality: N/A;  Excision of Vaginal apical cyst  . Tubal ligation    . Pubovaginal sling N/A 04/24/2014    Procedure: EXCISE OF VAGINAL APICAL MESH EXTRUSION;  Surgeon:  Ailene Rud, MD;  Location: [facility];  Service: Urology;  Laterality: N/A;  . Cystoscopy N/A 04/24/2014    Procedure: CYSTOSCOPY;  Surgeon: Ailene Rud, MD;  Location[facility];  Service: Urology;  Laterality: N/A;  . Abdominal hysterectomy    . Colonoscopy N/A 10/23/2014    Procedure: COLONOSCOPY;  Surgeon: Inda Castle, MD;  Location: [facility];  Service: Endoscopy;  Laterality: N/A;  . Esophagogastroduodenoscopy (egd) with propofol N/A 10/23/2014    Procedure: ESOPHAGOGASTRODUODENOSCOPY (EGD) WITH PROPOFOL;  Surgeon: Inda Castle, MD;  Location: [facility];  Service: Endoscopy;  Laterality: N/A;    There were no vitals filed for this visit.  Visit Diagnosis:  Right-sided low back pain with right-sided sciatica  Abdominal weakness      Subjective Assessment - 08/20/15 1136    Subjective Patiient continues to report higher level of pain today in the R low back (SI area). The traction helped decrease pain to 2/10 for two days then pain increased.   Diagnostic tests MRI results: central disc bulge L3/4, R foraminal stenosis L3/4   Patient Stated Goals to get rid of the pain   Currently in Pain? Yes   Pain Score 4    Pain Location Back   Pain Orientation Right;Lower   Pain Descriptors / Indicators Sharp   Pain  Type Acute pain   Pain Onset More than a month ago   Pain Frequency Intermittent                         [facility] Adult PT Treatment/Exercise - 08/20/15 0001    Self-Care   Self-Care Other Self-Care Comments   Other Self-Care Comments  Discussed MRI results and what they mean   Lumbar Exercises: Supine   Ab Set --  5 x 10 second hold   AB Set Limitations unable to contract TA without pelvic floor   Bent Knee Raise 20 reps   Other Supine Lumbar Exercises bent knee raise to table top x 20 reps   Lumbar Exercises: Prone   Straight Leg Raise 2 seconds;20 reps  B   Other Prone Lumbar Exercises  plank elbows/knees 3 x max hold   Modalities   Modalities Traction   Traction   Type of Traction Lumbar   Min (lbs) 5   Max (lbs) 50   Hold Time 99   Rest Time 5   Time 15                PT Education - 08/20/15 1204    Education provided Yes   Education Details HEP   Person(s) Educated Patient   Methods Explanation;Demonstration;Handout   Comprehension Verbalized understanding;Returned demonstration          PT Short Term Goals - 08/13/15 1147    PT SHORT TERM GOAL #1   Title I with initial HEP 08/14/15   Time 2   Period Weeks   Status Achieved           PT Long Term Goals - 08/13/15 1147    PT LONG TERM GOAL #1   Title I with advanced hep   Time 6   Period Weeks   Status On-going   PT LONG TERM GOAL #2   Title able to perform ADLS without back pain.   Time 6   Period Weeks   Status On-going  2/10 R low back with ADLs   PT LONG TERM GOAL #3   Title no report of LE sx with ADLS   Time 6   Period Weeks   Status On-going  Reports cramping sensation to toes   PT LONG TERM GOAL #4   Title able to verbalize and/or demonstrate body mechanic techniques to prevent further injury   Time 6   Period Weeks   Status Achieved               Plan - 08/20/15 1204    Clinical Impression Statement Paitient had positive response to traction and so we increased pull again today. She had no pain at end of treatment. MRI results positive for a disc bulge and stenosis and trace anterolisthesis. Patient reported difficulty with TA activation but by end of treatment was I. She also was able to abolish pain following modified plank and prone hip ext exercises.    PT Next Visit Plan continue to assess response to traction, prone to quadriped lumbar stab, modified plank/side plank.   Consulted and Agree with Plan of Care Patient        Problem List Patient Active Problem List   Diagnosis Date Noted  . GAD (generalized anxiety disorder) 07/20/2015  . Insomnia  07/20/2015  . Internal hemorrhoids without complication 37/85/8850  . Prolapse of vaginal wall with midline cystocele 04/24/2014  . Abdominal pain, generalized 11/18/2010  . MIXED  INCONTINENCE URGE AND STRESS 03/21/2009  . Major depressive disorder, recurrent episode ([place]) 12/10/2006  . PANIC ATTACKS 12/10/2006  . POST TRAUMATIC STRESS DISORDER 12/10/2006  . GASTROESOPHAGEAL REFLUX, NO ESOPHAGITIS 12/10/2006   Madelyn Flavors PT  08/20/2015, 12:19 PM  [facility] [address] [place], [place], [address] Phone: [phone]   Fax:  [phone]  Name: Margaret Barker MRN: [id number] Date of Birth: [birth year]  PHYSICAL THERAPY DISCHARGE SUMMARY  Visits from Start of Care: 6.  Current functional level related to goals / functional outcomes: Please see above.   Remaining deficits: Some continued pain.   Education / Equipment: HEP. Plan: Patient agrees to discharge.  Patient goals were partially met. Patient is being discharged due to being pleased with the current functional level.  ?????        Mali Applegate MPT.

## 2015-08-22 ENCOUNTER — Ambulatory Visit (INDEPENDENT_AMBULATORY_CARE_PROVIDER_SITE_OTHER): Payer: Medicare Other | Admitting: Family Medicine

## 2015-08-22 ENCOUNTER — Encounter: Payer: Self-pay | Admitting: Family Medicine

## 2015-08-22 VITALS — BP 96/65 | HR 74 | Temp 97.1°F | Ht 64.0 in | Wt 160.0 lb

## 2015-08-22 DIAGNOSIS — M4126 Other idiopathic scoliosis, lumbar region: Secondary | ICD-10-CM | POA: Diagnosis not present

## 2015-08-22 DIAGNOSIS — M419 Scoliosis, unspecified: Secondary | ICD-10-CM

## 2015-08-22 DIAGNOSIS — M5431 Sciatica, right side: Secondary | ICD-10-CM | POA: Diagnosis not present

## 2015-08-22 MED ORDER — PREGABALIN 225 MG PO CAPS: 225.0000 mg | ORAL_CAPSULE | Freq: Two times a day (BID) | ORAL | Status: DC

## 2015-08-22 MED ORDER — PREDNISONE 20 MG PO TABS: 20.0000 mg | ORAL_TABLET | Freq: Two times a day (BID) | ORAL | Status: DC

## 2015-08-22 MED ORDER — TRAMADOL HCL 50 MG PO TABS: 50.0000 mg | ORAL_TABLET | Freq: Two times a day (BID) | ORAL | Status: DC | PRN

## 2015-08-22 NOTE — Progress Notes (Signed)
Subjective:  Patient ID: Margaret Barker, female    DOB: 1971-04-26  Age: 44 y.o. MRN: [id number]  CC: Back Pain   HPI Margaret Barker presents for can't sit, lay down. 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 Going to PT. Hurts worse.8/10. No relief. Pain in midlumbar region radiating to right thigh posteriorly. Deep ache. Worsening in spite of pt & MEDS  History Margaret Barker has a past medical history of Alcoholism ([place]); Bipolar disorder ([place]); Anxiety; Depression; Panic attacks; Frequency of urination; SUI (stress urinary incontinence, female); Nocturia; Hyperlipidemia; History of panic attacks; GERD (gastroesophageal reflux disease); Helicobacter pylori gastritis; Esophagitis; Erosion of vaginal wall due to surgical mesh; and Wears glasses.   She has past surgical history that includes [place] (08-09-2009 DR Gaynelle Arabian); LEFT BREAST EXCISIONAL BX (03-26-2007); Laparoscopic assisted vaginal hysterectomy (10-22-2006); RIGHT EXCISIONAL BREAST BX (10-23-2005); Cholecystectomy (1992); D & C HYSTEROSCOPY W/ POLYP REMOVAL (04-22-2006); Lesion removal (07/09/2012); Tubal ligation; Pubovaginal sling (N/A, 04/24/2014); Cystoscopy (N/A, 04/24/2014); Abdominal hysterectomy; Colonoscopy (N/A, 10/23/2014); and Esophagogastroduodenoscopy (egd) with propofol (N/A, 10/23/2014).   Her family history includes COPD in her father; Cervical cancer in her paternal grandmother; Colitis in her father and sister; Colon cancer in her paternal uncle; Diabetes in her mother; Heart disease in her father; Hypertension in her father; Liver cancer in her paternal uncle.She reports that she has been smoking Cigarettes.  She has a 24 pack-year smoking history. She has never used smokeless tobacco. She reports that she uses illicit drugs (Marijuana). She reports that she does not drink alcohol.  Outpatient Prescriptions Prior to Visit  Medication Sig  Dispense Refill  . atorvastatin (LIPITOR) 40 MG tablet Take 1 tablet (40 mg total) by mouth daily. 90 tablet 3  . DULoxetine (CYMBALTA) 30 MG capsule Take 30 mg by mouth daily. Three tablets once a day    . estradiol (ESTRACE) 1 MG tablet Take 21 days then off 7 days. 21 tablet 6  . glycopyrrolate (ROBINUL) 2 MG tablet Take 1 tablet (2 mg total) by mouth 2 (two) times daily. 60 tablet 3  . Linaclotide (LINZESS) 290 MCG CAPS capsule Take 1 capsule (290 mcg total) by mouth daily. To treat and prevent constipation 30 capsule 2  . polyethylene glycol powder (GLYCOLAX/MIRALAX) powder Take 17 g by mouth 2 (two) times daily as needed. 3350 g 1  . ranitidine (ZANTAC) 150 MG tablet TAKE ONE TABLET BY MOUTH TWICE DAILY 60 tablet 0  . solifenacin (VESICARE) 10 MG tablet Take 10 mg by mouth every morning.     . traZODone (DESYREL) 100 MG tablet Take 200 mg by mouth at bedtime.     . Vitamin D, Ergocalciferol, (DRISDOL) 50000 UNITS CAPS capsule Take 1 capsule (50,000 Units total) by mouth every 7 (seven) days. 12 capsule 3  . pregabalin (LYRICA) 300 MG capsule 1 at bedtime 30 capsule 5  . cyclobenzaprine (FLEXERIL) 10 MG tablet Take 1 tablet (10 mg total) by mouth 3 (three) times daily as needed for muscle spasms. (Patient not taking: Reported on 08/22/2015) 90 tablet 1  . valACYclovir (VALTREX) 1000 MG tablet TAKE ONE TABLET BY MOUTH TWICE DAILY (Patient not taking: Reported on 08/22/2015) 60 tablet 2  . busPIRone (BUSPAR) 10 MG tablet Take 10 mg by mouth 3 (three) times daily.      Facility-Administered Medications Prior to Visit  Medication Dose Route Frequency Provider Last Rate Last Dose  . bupivacaine-EPINEPHrine (MARCAINE W/ EPI) 0.25 % (  with pres) injection 20 mL  20 mL Infiltration Once Carolan Clines, MD        ROS Review of Systems  Constitutional: Negative for fever, activity change and appetite change.  HENT: Negative for congestion, rhinorrhea and sore throat.   Eyes: Negative for pain  and visual disturbance.  Respiratory: Negative for cough and shortness of breath.   Gastrointestinal: Negative for nausea and abdominal pain.  Musculoskeletal: Negative for myalgias and arthralgias.    Objective:  BP 96/65 mmHg  Pulse 74  Temp(Src) 97.1 F (36.2 C) (Oral)  Ht 5\' 4"  (1.626 m)  Wt 160 lb (72.576 kg)  BMI 27.45 kg/m2  SpO2 98%  BP Readings from Last 3 Encounters:  08/22/15 96/65  08/08/15 106/74  07/25/15 97/65    Wt Readings from Last 3 Encounters:  08/22/15 160 lb (72.576 kg)  08/17/15 158 lb (71.668 kg)  08/08/15 158 lb 9.6 oz (71.94 kg)     Physical Exam  Constitutional: She is oriented to person, place, and time. She appears well-developed and well-nourished. No distress.  HENT:  Head: Normocephalic and atraumatic.  Eyes: Conjunctivae and EOM are normal. Pupils are equal, round, and reactive to light.  Neck: Normal range of motion. Neck supple.  Cardiovascular: Normal rate, regular rhythm and normal heart sounds.   No murmur heard. Pulmonary/Chest: Effort normal and breath sounds normal. No respiratory distress. She has no wheezes. She has no rales.  Abdominal: Soft. Bowel sounds are normal. She exhibits no distension. There is no tenderness.  Musculoskeletal: She exhibits tenderness (lower lumbar paraspinous musculature. Limited spinal flexion for bending at 45 degrees). She exhibits no edema.  Neurological: She is alert and oriented to person, place, and time. She has normal reflexes.  Skin: Skin is warm and dry.  Psychiatric: She has a normal mood and affect. Her behavior is normal. Thought content normal.    No results found for: HGBA1C  Lab Results  Component Value Date   WBC 8.4 07/20/2015   HGB 14.0 09/19/2014   HCT 42.3 07/20/2015   PLT 243 09/19/2014   GLUCOSE 74 07/20/2015   CHOL 281* 07/20/2015   TRIG 190* 07/20/2015   HDL 58 07/20/2015   LDLCALC 185* 07/20/2015   ALT 32 07/20/2015   AST 23 07/20/2015   NA 138 07/20/2015   K  4.3 07/20/2015   CL 98 07/20/2015   CREATININE 0.82 07/20/2015   BUN 10 07/20/2015   CO2 25 07/20/2015   TSH 1.380 07/20/2015    Mr Lumbar Spine Wo Contrast  08/17/2015  CLINICAL DATA:  44 year old female with right side lumbar back pain radiating to the hip for 6 months with no known injury. Initial encounter. EXAM: MRI LUMBAR SPINE WITHOUT CONTRAST TECHNIQUE: Multiplanar, multisequence MR imaging of the lumbar spine was performed. No intravenous contrast was administered. COMPARISON:  CT Abdomen and Pelvis 09/29/2014. FINDINGS: Mild levoconvex lumbar scoliosis. Normal lumbar segmentation demonstrated on the comparison. Mild degenerative appearing endplate marrow edema anteriorly and eccentric to the left at L3-L4. There is also marrow edema in the right L3-L4 facet joint which is degenerated (series 5, image 6). Minimal surrounding soft tissue inflammation. No other acute osseous abnormality identified. Negative visualized sacrum and SI joints. Visualized lower thoracic spinal cord is normal with conus medularis at T12-L1. Visualized abdominal viscera are within normal limits. Aside from around the right L3-L4 facet joint, visualized paraspinal soft tissues are within normal limits. T11-T12:  Negative. T12-L1:  Negative. L1-L2:  Negative. L2-L3:  Negative disc.  Mild facet hypertrophy.  No stenosis. L3-L4: Trace anterolisthesis. Circumferential disc bulge. Moderate facet hypertrophy. Right side facet joint marrow edema (series 5, image 6). No spinal or lateral recess stenosis. Mild to moderate right L3 foraminal stenosis. L4-L5: Negative disc. Mild to moderate facet and ligament flavum hypertrophy greater on the left. Mild left L4 foraminal stenosis. L5-S1:  Negative disc.  Mild facet hypertrophy. IMPRESSION: 1. Levoconvex lumbar scoliosis. Symptomatic level suspected to be L3-L4 were there is chronic disc and facet degeneration with mild right facet joint and anterior endplate marrow edema compatible  with acute exacerbation of the chronic arthritis. There is associated mild to moderate right L3 foraminal stenosis. 2. Mild lumbar spine degeneration elsewhere.  No spinal stenosis. Electronically Signed   By: Genevie Ann M.D.   On: 08/17/2015 18:13    Assessment & Plan:   Ignacia was seen today for back pain.  Diagnoses and all orders for this visit:  Scoliosis of lumbar spine -     Ambulatory referral to Orthopedic Surgery  Sciatica, right  Other orders -     pregabalin (LYRICA) 225 MG capsule; Take 1 capsule (225 mg total) by mouth 2 (two) times daily. 1 at bedtime -     predniSONE (DELTASONE) 20 MG tablet; Take 1 tablet (20 mg total) by mouth 2 (two) times daily with a meal. -     traMADol (ULTRAM) 50 MG tablet; Take 1 tablet (50 mg total) by mouth 2 (two) times daily as needed for moderate pain.   I have discontinued Ms. Dawson's busPIRone. I have also changed her pregabalin. Additionally, I am having her start on predniSONE and traMADol. Lastly, I am having her maintain her solifenacin, traZODone, glycopyrrolate, cyclobenzaprine, ranitidine, valACYclovir, estradiol, DULoxetine, polyethylene glycol powder, Vitamin D (Ergocalciferol), atorvastatin, and Linaclotide.  Meds ordered this encounter  Medications  . pregabalin (LYRICA) 225 MG capsule    Sig: Take 1 capsule (225 mg total) by mouth 2 (two) times daily. 1 at bedtime    Dispense:  60 capsule    Refill:  2  . predniSONE (DELTASONE) 20 MG tablet    Sig: Take 1 tablet (20 mg total) by mouth 2 (two) times daily with a meal.    Dispense:  60 tablet    Refill:  0  . traMADol (ULTRAM) 50 MG tablet    Sig: Take 1 tablet (50 mg total) by mouth 2 (two) times daily as needed for moderate pain.    Dispense:  60 tablet    Refill:  1     Follow-up: Return in about 2 weeks (around 09/05/2015).  Claretta Fraise, M.D.

## 2015-08-23 ENCOUNTER — Encounter: Payer: Medicare Other | Admitting: Physical Therapy

## 2015-08-27 ENCOUNTER — Encounter: Payer: Self-pay | Admitting: *Deleted

## 2015-08-27 DIAGNOSIS — M5441 Lumbago with sciatica, right side: Secondary | ICD-10-CM | POA: Diagnosis not present

## 2015-08-29 ENCOUNTER — Telehealth: Payer: Self-pay | Admitting: Family Medicine

## 2015-08-29 NOTE — Telephone Encounter (Signed)
Please advise 

## 2015-08-30 MED ORDER — TRAZODONE HCL 100 MG PO TABS: 200.0000 mg | ORAL_TABLET | Freq: Every day | ORAL | Status: DC

## 2015-08-30 NOTE — Telephone Encounter (Signed)
Scrip sent. Tell pt.

## 2015-08-30 NOTE — Telephone Encounter (Signed)
Left detailed message that rx was sent in and to [facility] with any further questions or concerns.

## 2015-09-05 ENCOUNTER — Ambulatory Visit: Payer: Medicare Other | Admitting: Family Medicine

## 2015-09-14 DIAGNOSIS — M5416 Radiculopathy, lumbar region: Secondary | ICD-10-CM | POA: Diagnosis not present

## 2015-09-14 DIAGNOSIS — M5441 Lumbago with sciatica, right side: Secondary | ICD-10-CM | POA: Diagnosis not present

## 2015-09-14 DIAGNOSIS — M4806 Spinal stenosis, lumbar region: Secondary | ICD-10-CM | POA: Diagnosis not present

## 2015-09-14 DIAGNOSIS — M4316 Spondylolisthesis, lumbar region: Secondary | ICD-10-CM | POA: Diagnosis not present

## 2015-09-17 DIAGNOSIS — F329 Major depressive disorder, single episode, unspecified: Secondary | ICD-10-CM | POA: Diagnosis not present

## 2015-09-27 DIAGNOSIS — M5441 Lumbago with sciatica, right side: Secondary | ICD-10-CM | POA: Diagnosis not present

## 2015-10-05 DIAGNOSIS — G8929 Other chronic pain: Secondary | ICD-10-CM | POA: Diagnosis not present

## 2015-10-05 DIAGNOSIS — M5441 Lumbago with sciatica, right side: Secondary | ICD-10-CM | POA: Diagnosis not present

## 2015-10-05 DIAGNOSIS — M533 Sacrococcygeal disorders, not elsewhere classified: Secondary | ICD-10-CM | POA: Diagnosis not present

## 2015-10-10 ENCOUNTER — Other Ambulatory Visit: Payer: Self-pay | Admitting: Family Medicine

## 2015-10-10 ENCOUNTER — Telehealth: Payer: Self-pay | Admitting: Family Medicine

## 2015-10-10 MED ORDER — ESTRADIOL 2 MG PO TABS: ORAL_TABLET | ORAL | Status: DC

## 2015-10-10 NOTE — Telephone Encounter (Signed)
Please advise and route to Pool B 

## 2015-10-23 DIAGNOSIS — M5441 Lumbago with sciatica, right side: Secondary | ICD-10-CM | POA: Diagnosis not present

## 2015-10-24 ENCOUNTER — Ambulatory Visit (INDEPENDENT_AMBULATORY_CARE_PROVIDER_SITE_OTHER): Payer: Medicare Other | Admitting: Family Medicine

## 2015-10-24 ENCOUNTER — Encounter: Payer: Self-pay | Admitting: Family Medicine

## 2015-10-24 VITALS — BP 97/67 | HR 108 | Temp 97.7°F | Ht 64.0 in | Wt 159.0 lb

## 2015-10-24 DIAGNOSIS — M4126 Other idiopathic scoliosis, lumbar region: Secondary | ICD-10-CM

## 2015-10-24 DIAGNOSIS — F411 Generalized anxiety disorder: Secondary | ICD-10-CM

## 2015-10-24 DIAGNOSIS — M533 Sacrococcygeal disorders, not elsewhere classified: Secondary | ICD-10-CM | POA: Diagnosis not present

## 2015-10-24 DIAGNOSIS — M419 Scoliosis, unspecified: Secondary | ICD-10-CM

## 2015-10-24 NOTE — Progress Notes (Signed)
Subjective:  Patient ID: Margaret Barker, female    DOB: [birth year]  Age: 45 y.o. MRN: [id number]  CC: Back Pain   HPI Margaret Barker presents for 7/10 . back pain. Saw ortho. Had injections with Dr. Nelva Bush and eval for rods with Dr. Rolena Infante. shots didn't help and Dr. Rolena Infante did not recommend surgery. Now having pain without a plan for pain control. Located at midline lower lumbar and to the left toward SI joint.  History Margaret Barker has a past medical history of Alcoholism ([place]); Bipolar disorder ([place]); Anxiety; Depression; Panic attacks; Frequency of urination; SUI (stress urinary incontinence, female); Nocturia; Hyperlipidemia; History of panic attacks; GERD (gastroesophageal reflux disease); Helicobacter pylori gastritis; Esophagitis; Erosion of vaginal wall due to surgical mesh; and Wears glasses.   She has past surgical history that includes [place] (08-09-2009 DR Gaynelle Arabian); LEFT BREAST EXCISIONAL BX (03-26-2007); Laparoscopic assisted vaginal hysterectomy (10-22-2006); RIGHT EXCISIONAL BREAST BX (10-23-2005); Cholecystectomy (1992); D & C HYSTEROSCOPY W/ POLYP REMOVAL (04-22-2006); Lesion removal (07/09/2012); Tubal ligation; Pubovaginal sling (N/A, 04/24/2014); Cystoscopy (N/A, 04/24/2014); Abdominal hysterectomy; Colonoscopy (N/A, 10/23/2014); and Esophagogastroduodenoscopy (egd) with propofol (N/A, 10/23/2014).   Her family history includes COPD in her father; Cervical cancer in her paternal grandmother; Colitis in her father and sister; Colon cancer in her paternal uncle; Diabetes in her mother; Heart disease in her father; Hypertension in her father; Liver cancer in her paternal uncle.She reports that she has been smoking Cigarettes.  She has a 24 pack-year smoking history. She has never used smokeless tobacco. She reports that she uses illicit drugs (Marijuana). She reports that she does not drink  alcohol.  Outpatient Prescriptions Prior to Visit  Medication Sig Dispense Refill  . atorvastatin (LIPITOR) 40 MG tablet Take 1 tablet (40 mg total) by mouth daily. 90 tablet 3  . DULoxetine (CYMBALTA) 30 MG capsule Take 30 mg by mouth daily. Three tablets once a day    . estradiol (ESTRACE) 2 MG tablet Take 21 days then off 7 days. 30 tablet 0  . glycopyrrolate (ROBINUL) 2 MG tablet Take 1 tablet (2 mg total) by mouth 2 (two) times daily. 60 tablet 3  . Linaclotide (LINZESS) 290 MCG CAPS capsule Take 1 capsule (290 mcg total) by mouth daily. To treat and prevent constipation 30 capsule 2  . polyethylene glycol powder (GLYCOLAX/MIRALAX) powder Take 17 g by mouth 2 (two) times daily as needed. 3350 g 1  . ranitidine (ZANTAC) 150 MG tablet TAKE ONE TABLET BY MOUTH TWICE DAILY 60 tablet 0  . solifenacin (VESICARE) 10 MG tablet Take 10 mg by mouth every morning.     . cyclobenzaprine (FLEXERIL) 10 MG tablet Take 1 tablet (10 mg total) by mouth 3 (three) times daily as needed for muscle spasms. (Patient not taking: Reported on 08/22/2015) 90 tablet 1  . predniSONE (DELTASONE) 20 MG tablet Take 1 tablet (20 mg total) by mouth 2 (two) times daily with a meal. 60 tablet 0  . pregabalin (LYRICA) 225 MG capsule Take 1 capsule (225 mg total) by mouth 2 (two) times daily. 1 at bedtime 60 capsule 2  . traMADol (ULTRAM) 50 MG tablet Take 1 tablet (50 mg total) by mouth 2 (two) times daily as needed for moderate pain. 60 tablet 1  . traZODone (DESYREL) 100 MG tablet Take 2 tablets (200 mg total) by mouth at bedtime. 60 tablet 10  . valACYclovir (VALTREX) 1000 MG tablet TAKE ONE TABLET BY  MOUTH TWICE DAILY (Patient not taking: Reported on 08/22/2015) 60 tablet 2  . Vitamin D, Ergocalciferol, (DRISDOL) 50000 UNITS CAPS capsule Take 1 capsule (50,000 Units total) by mouth every 7 (seven) days. 12 capsule 3   Facility-Administered Medications Prior to Visit  Medication Dose Route Frequency Provider Last Rate Last  Dose  . bupivacaine-EPINEPHrine (MARCAINE W/ EPI) 0.25 % (with pres) injection 20 mL  20 mL Infiltration Once Carolan Clines, MD        ROS Review of Systems  Constitutional: Negative for fever, activity change and appetite change.  HENT: Negative for congestion, rhinorrhea and sore throat.   Eyes: Negative for visual disturbance.  Respiratory: Negative for cough and shortness of breath.   Cardiovascular: Negative for chest pain and palpitations.  Gastrointestinal: Negative for nausea, abdominal pain and diarrhea.  Genitourinary: Negative for dysuria.  Musculoskeletal: Positive for back pain and arthralgias. Negative for myalgias.    Objective:  BP 97/67 mmHg  Pulse 108  Temp(Src) 97.7 F (36.5 C) (Oral)  Ht 5\' 4"  (1.626 m)  Wt 159 lb (72.122 kg)  BMI 27.28 kg/m2  SpO2 98%  BP Readings from Last 3 Encounters:  10/24/15 97/67  08/22/15 96/65  08/08/15 106/74    Wt Readings from Last 3 Encounters:  10/24/15 159 lb (72.122 kg)  08/22/15 160 lb (72.576 kg)  08/17/15 158 lb (71.668 kg)     Physical Exam  Constitutional: She appears well-developed and well-nourished.  HENT:  Head: Normocephalic.  Cardiovascular: Normal rate and regular rhythm.   No murmur heard. Pulmonary/Chest: Effort normal and breath sounds normal.  Musculoskeletal: She exhibits tenderness (with positive Patrick sign on left).     Lab Results  Component Value Date   WBC 8.4 07/20/2015   HGB 14.0 09/19/2014   HCT 42.3 07/20/2015   PLT 299 07/20/2015   GLUCOSE 74 07/20/2015   CHOL 281* 07/20/2015   TRIG 190* 07/20/2015   HDL 58 07/20/2015   LDLCALC 185* 07/20/2015   ALT 32 07/20/2015   AST 23 07/20/2015   NA 138 07/20/2015   K 4.3 07/20/2015   CL 98 07/20/2015   CREATININE 0.82 07/20/2015   BUN 10 07/20/2015   CO2 25 07/20/2015   TSH 1.380 07/20/2015    Mr Lumbar Spine Wo Contrast  08/17/2015  CLINICAL DATA:  45 year old female with right side lumbar back pain radiating to the  hip for 6 months with no known injury. Initial encounter. EXAM: MRI LUMBAR SPINE WITHOUT CONTRAST TECHNIQUE: Multiplanar, multisequence MR imaging of the lumbar spine was performed. No intravenous contrast was administered. COMPARISON:  CT Abdomen and Pelvis 09/29/2014. FINDINGS: Mild levoconvex lumbar scoliosis. Normal lumbar segmentation demonstrated on the comparison. Mild degenerative appearing endplate marrow edema anteriorly and eccentric to the left at L3-L4. There is also marrow edema in the right L3-L4 facet joint which is degenerated (series 5, image 6). Minimal surrounding soft tissue inflammation. No other acute osseous abnormality identified. Negative visualized sacrum and SI joints. Visualized lower thoracic spinal cord is normal with conus medularis at T12-L1. Visualized abdominal viscera are within normal limits. Aside from around the right L3-L4 facet joint, visualized paraspinal soft tissues are within normal limits. T11-T12:  Negative. T12-L1:  Negative. L1-L2:  Negative. L2-L3:  Negative disc.  Mild facet hypertrophy.  No stenosis. L3-L4: Trace anterolisthesis. Circumferential disc bulge. Moderate facet hypertrophy. Right side facet joint marrow edema (series 5, image 6). No spinal or lateral recess stenosis. Mild to moderate right L3 foraminal stenosis. L4-L5: Negative disc.  Mild to moderate facet and ligament flavum hypertrophy greater on the left. Mild left L4 foraminal stenosis. L5-S1:  Negative disc.  Mild facet hypertrophy. IMPRESSION: 1. Levoconvex lumbar scoliosis. Symptomatic level suspected to be L3-L4 were there is chronic disc and facet degeneration with mild right facet joint and anterior endplate marrow edema compatible with acute exacerbation of the chronic arthritis. There is associated mild to moderate right L3 foraminal stenosis. 2. Mild lumbar spine degeneration elsewhere.  No spinal stenosis. Electronically Signed   By: Genevie Ann M.D.   On: 08/17/2015 18:13    Assessment &  Plan:   Margaret Barker was seen today for back pain.  Diagnoses and all orders for this visit:  GAD (generalized anxiety disorder)  Sacroiliac joint pain -     Ambulatory referral to Physical Therapy -     Ambulatory referral to Pain Clinic  Scoliosis of lumbar spine -     Ambulatory referral to Physical Therapy -     Ambulatory referral to Pain Clinic   I have discontinued Ms. Bellina's cyclobenzaprine, valACYclovir, Vitamin D (Ergocalciferol), pregabalin, predniSONE, traMADol, and traZODone. I am also having her maintain her solifenacin, glycopyrrolate, ranitidine, DULoxetine, polyethylene glycol powder, atorvastatin, Linaclotide, estradiol, busPIRone, and omeprazole.  Meds ordered this encounter  Medications  . busPIRone (BUSPAR) 10 MG tablet    Sig:   . omeprazole (PRILOSEC) 20 MG capsule    Sig:      Follow-up: Return in about 3 months (around 01/22/2016).  Claretta Fraise, M.D.

## 2015-11-01 ENCOUNTER — Encounter: Payer: Self-pay | Admitting: Family Medicine

## 2015-11-01 ENCOUNTER — Other Ambulatory Visit: Payer: Self-pay | Admitting: Family Medicine

## 2015-11-01 DIAGNOSIS — M533 Sacrococcygeal disorders, not elsewhere classified: Principal | ICD-10-CM

## 2015-11-01 DIAGNOSIS — G8929 Other chronic pain: Secondary | ICD-10-CM

## 2015-11-05 ENCOUNTER — Ambulatory Visit: Payer: Medicare Other | Admitting: Physical Therapy

## 2015-11-13 ENCOUNTER — Ambulatory Visit: Payer: Medicare Other | Attending: Family Medicine | Admitting: Physical Therapy

## 2015-11-13 ENCOUNTER — Encounter: Payer: Self-pay | Admitting: Physical Therapy

## 2015-11-13 DIAGNOSIS — M545 Low back pain, unspecified: Secondary | ICD-10-CM

## 2015-11-13 DIAGNOSIS — M533 Sacrococcygeal disorders, not elsewhere classified: Secondary | ICD-10-CM | POA: Insufficient documentation

## 2015-11-13 DIAGNOSIS — R6889 Other general symptoms and signs: Secondary | ICD-10-CM | POA: Insufficient documentation

## 2015-11-13 NOTE — Patient Instructions (Signed)
Lower abdominal/core stability exercises  1. Practice your breathing technique: Inhale through your nose expanding your belly and rib cage. Try not to breathe into your chest. Exhale slowly and gradually out your mouth feeling a sense of softness to your body. Practice multiple times. This can be performed unlimited.  2. Finding the lower abdominals. Laying on your back with the knees bent, place your fingers just below your belly button. Using your breathing technique from above, on your exhale gently pull the belly button away from your fingertips without tensing any other muscles. Practice this 5x. Next, as you exhale, draw belly button inwards and hold onto it...then feel as if you are pulling that muscle across your pelvis like you are tightening a belt. This can be hard to do at first so be patient and practice. Do 5-10 reps 1-3 x day. Always recognize quality over quantity; if your abdominal muscles become tired you will notice you may tighten/contract other muscles. This is the time to take a break.   Practice this first laying on your back, then in sitting, progressing to standing and finally adding it to all your daily movements.    3. Adding leg movements. Add the following leg movements to challenge your ability to keep your core stable:  1. Single leg drop outs: Laying on your back with knees bent feet flat. Inhale,  dropping one knee outward KEEPING YOUR PELVIS STILL. Exhale as you bring the leg back, simultaneously performing your lower abdominal contraction. Do 5-10 on each leg.   2. Marching: While keeping your pelvis still, lift the right foot a few inches, put it down then lift left foot. This will mimic a march. Start slow to establish control. Once you have control you may speed it up. Do 10-20x. You MUST keep your lower abdominlas contracted while you march. Breathe naturally    3. Single leg slides: Inhale while you slowly slide one leg out keeping your pelvis still. Only slide  your leg as far as you can keep your pelvis still. Exhale as you bring the leg back to the start, contracting the lower abdominals as you do that. Keep your upper body relaxed. Do 5-10 on each side.      Madelyn Flavors, PT 11/13/2015 12:01 PM [facility] [address] [place], [place], [address] Phone: [phone]   Fax:  [phone]

## 2015-11-13 NOTE — Therapy (Signed)
[facility] [place], [place], [address] Phone: [phone]   Fax:  [phone]  Physical Therapy Evaluation  Patient Details  Name: Margaret Barker MRN: [id number] Date of Birth: [birth year] Referring Provider: Claretta Fraise  Encounter Date: 11/13/2015      PT End of Session - 11/13/15 1125    Visit Number 1   Number of Visits 12   Date for PT Re-Evaluation 12/25/15   PT Start Time 1125   PT Stop Time 1215   PT Time Calculation (min) 50 min   Activity Tolerance Patient tolerated treatment well   Behavior During Therapy [facility] for tasks assessed/performed      Past Medical History  Diagnosis Date  . Alcoholism ([place])   . Bipolar disorder ([place])   . Anxiety   . Depression   . Panic attacks   . Frequency of urination   . SUI (stress urinary incontinence, female)   . Nocturia   . Hyperlipidemia   . History of panic attacks   . GERD (gastroesophageal reflux disease)   . Helicobacter pylori gastritis     DX  04-13-2014-- TREATED WITH ANTBIOTICS  . Esophagitis   . Erosion of vaginal wall due to surgical mesh   . Wears glasses     Past Surgical History  Procedure Laterality Date  . Sacrospinus anterior culposuspension with uphold mesh and solyx single incision transurethral sling  08-09-2009 DR TANNENBAUM    STRESS INCONTINENCE W/ PELVIC FLOOR PROLAPSE  . Left breast excisional bx  03-26-2007    BENIGN  . Laparoscopic assisted vaginal hysterectomy  10-22-2006  . Right excisional breast bx  10-23-2005    BENIGN  . Cholecystectomy  1992  . D & c hysteroscopy w/ polyp removal  04-22-2006  . Lesion removal  07/09/2012    Procedure: EXCISION VAGINAL LESION;  Surgeon: Ailene Rud, MD;  Location[facility];  Service: Urology;  Laterality: N/A;  Excision of Vaginal apical cyst  . Tubal ligation    . Pubovaginal sling N/A 04/24/2014    Procedure: EXCISE OF VAGINAL APICAL MESH EXTRUSION;  Surgeon:  Ailene Rud, MD;  Location: [facility];  Service: Urology;  Laterality: N/A;  . Cystoscopy N/A 04/24/2014    Procedure: CYSTOSCOPY;  Surgeon: Ailene Rud, MD;  Location: [facility];  Service: Urology;  Laterality: N/A;  . Abdominal hysterectomy    . Colonoscopy N/A 10/23/2014    Procedure: COLONOSCOPY;  Surgeon: Inda Castle, MD;  Location: [facility];  Service: Endoscopy;  Laterality: N/A;  . Esophagogastroduodenoscopy (egd) with propofol N/A 10/23/2014    Procedure: ESOPHAGOGASTRODUODENOSCOPY (EGD) WITH PROPOFOL;  Surgeon: Inda Castle, MD;  Location: [facility];  Service: Endoscopy;  Laterality: N/A;    There were no vitals filed for this visit.  Visit Diagnosis:  Sacroiliac joint pain - Plan: PT plan of care cert/re-cert  Bilateral low back pain without sciatica - Plan: PT plan of care cert/re-cert  Activity intolerance - Plan: PT plan of care cert/re-cert      Subjective Assessment - 11/13/15 1126    Subjective Patient had SIJ injections in the R side in November and they helped, but patient still has pain in gluts and B lumbar spine. Patient also reports swelling on the right side and new onset in the L SIJ.   How long can you sit comfortably? 30 min   How long can you stand comfortably? 15 min   Diagnostic tests  MRI results: central disc bulge L3/4, R foraminal stenosis L3/4   Patient Stated Goals to get rid of the pain   Currently in Pain? Yes   Pain Score 3    Pain Location Back   Pain Orientation Right;Left;Lower   Pain Descriptors / Indicators Stabbing   Pain Type Chronic pain   Pain Radiating Towards R gluteals and B SIJ   Pain Onset More than a month ago   Pain Frequency Intermittent   Aggravating Factors  moving and walking. lying on sides   Pain Relieving Factors lying on back   Effect of Pain on Daily Activities limited            [facility] PT Assessment - 11/13/15 0001    Assessment   Medical Diagnosis  SIJ pain; lumbar scoliosis   Referring Provider Cletus Gash Stacks   Onset Date/Surgical Date 01/26/15   Next MD Visit April 2017   Precautions   Precautions None   Balance Screen   Has the patient fallen in the past 6 months No   Has the patient had a decrease in activity level because of a fear of falling?  No   Is the patient reluctant to leave their home because of a fear of falling?  No   Prior Function   Level of Independence Independent   Vocation Unemployed   Observation/Other Assessments   Focus on Therapeutic Outcomes (FOTO)  58% limited   Posture/Postural Control   Posture Comments increased lumbar lordosis   ROM / Strength   AROM / PROM / Strength AROM  Good transverse abdominus strength   AROM   AROM Assessment Site Lumbar   Lumbar Flexion hands to knees   Lumbar Extension neutral causes pain   Lumbar - Right Side Bend hands to mid thigh    Lumbar - Left Side Bend hands to mid thigh   Lumbar - Right Rotation WFL   Lumbar - Left Rotation WFL   Strength   Overall Strength Comments grossly 5/5 BLE   Flexibility   Soft Tissue Assessment /Muscle Length --  WNL   Palpation   Palpation comment B SIJ, active TPs in B glut med, piriformis   Special Tests    Special Tests Sacrolliac Tests   Sacroiliac Tests  --  neg compression/distraction; + instability test R                   [facility] Adult PT Treatment/Exercise - 11/13/15 0001    Lumbar Exercises: Supine   Ab Set 5 reps;5 seconds   Clam 10 reps   Heel Slides 10 reps   Bent Knee Raise 10 reps   Modalities   Modalities Electrical Stimulation;Moist Heat   Moist Heat Therapy   Number Minutes Moist Heat 15 Minutes   Moist Heat Location Lumbar Spine  gluteals   Electrical Stimulation   Electrical Stimulation Location B SIJ and gluteals 80-150Hz  x 15 min to tolerance   Electrical Stimulation Goals Pain                PT Education - 11/13/15 1235    Education provided Yes   Education Details HEP    Person(s) Educated Patient   Methods Explanation;Demonstration;Handout   Comprehension Returned demonstration;Verbalized understanding          PT Short Term Goals - 08/13/15 1147    PT SHORT TERM GOAL #1   Title I with initial HEP 08/14/15   Time 2   Period Weeks   Status  Achieved           PT Long Term Goals - Nov 14, 2015 1242    PT LONG TERM GOAL #1   Title I with advanced hep   Time 6   Period Weeks   Status New   PT LONG TERM GOAL #2   Title able to perform ADLS with pain [phone] or less.   Time 6   Period Weeks   Status New   PT LONG TERM GOAL #3   Title able to stand/walk while shopping for 30 min without having to rest from pain.   Time 6   Period Weeks   Status New               Plan - 2015/11/14 1236    Clinical Impression Statement Patient presents today with c/o pain in B low back and R SIJ. She also reports intermittent L SIJ pain. She has significant decreases in lumbar ROM since her last episode in Nov 2016. She responded well to Estim/heat and was able to stand in neutral after treatement without pain.   Pt will benefit from skilled therapeutic intervention in order to improve on the following deficits Decreased range of motion;Decreased activity tolerance;Pain;Postural dysfunction;Increased muscle spasms   Rehab Potential Excellent   PT Frequency 2x / week   PT Duration 6 weeks   PT Treatment/Interventions ADLs/Self Care Home Management;Electrical Stimulation;Cryotherapy;Moist Heat;Therapeutic exercise;Manual techniques;Dry needling;Patient/family education;Neuromuscular re-education   PT Next Visit Plan STW/TPR to B glut med/piriformis; core stability (prone to quadriped lumbar stab; progress TrAb); modified plank/side plank as tolerated.   PT Home Exercise Plan TrAb series   Consulted and Agree with Plan of Care Patient          G-Codes - 11/14/2015 1247    Functional Assessment Tool Used FOTO 58% limited   Functional Limitation Mobility:  Walking and moving around   Mobility: Walking and Moving Around Current Status [phone]) At least 40 percent but less than 60 percent impaired, limited or restricted   Mobility: Walking and Moving Around Goal Status [phone]) At least 40 percent but less than 60 percent impaired, limited or restricted       Problem List Patient Active Problem List   Diagnosis Date Noted  . GAD (generalized anxiety disorder) 07/20/2015  . Insomnia 07/20/2015  . Prolapse of vaginal wall with midline cystocele 04/24/2014  . MIXED INCONTINENCE URGE AND STRESS 03/21/2009  . Major depressive disorder, recurrent episode ([place]) 12/10/2006  . PANIC ATTACKS 12/10/2006  . POST TRAUMATIC STRESS DISORDER 12/10/2006  . GASTROESOPHAGEAL REFLUX, NO ESOPHAGITIS 12/10/2006    Madelyn Flavors PT  14-Nov-2015, 12:56 PM  [facility] [place], [place], [address] Phone: [phone]   Fax:  [phone]  Name: Margaret Barker MRN: [id number] Date of Birth: 11-12-1970

## 2015-11-14 ENCOUNTER — Encounter: Payer: Medicare Other | Admitting: Physical Therapy

## 2015-11-20 ENCOUNTER — Encounter: Payer: Medicare Other | Admitting: Physical Therapy

## 2015-11-21 ENCOUNTER — Telehealth: Payer: Self-pay | Admitting: Family Medicine

## 2015-11-21 ENCOUNTER — Ambulatory Visit: Payer: Medicare Other | Admitting: Family Medicine

## 2015-11-21 MED ORDER — ESTRADIOL 2 MG PO TABS: ORAL_TABLET | ORAL | Status: DC

## 2015-11-21 NOTE — Telephone Encounter (Signed)
done

## 2015-11-22 ENCOUNTER — Ambulatory Visit: Payer: Medicare Other | Admitting: Family Medicine

## 2015-11-22 ENCOUNTER — Ambulatory Visit: Payer: Medicare Other | Admitting: Family

## 2015-11-23 ENCOUNTER — Encounter: Payer: Self-pay | Admitting: Family Medicine

## 2015-11-27 ENCOUNTER — Other Ambulatory Visit: Payer: Self-pay | Admitting: Family Medicine

## 2015-12-04 DIAGNOSIS — M533 Sacrococcygeal disorders, not elsewhere classified: Secondary | ICD-10-CM | POA: Diagnosis not present

## 2015-12-06 ENCOUNTER — Ambulatory Visit (INDEPENDENT_AMBULATORY_CARE_PROVIDER_SITE_OTHER): Payer: Medicare Other | Admitting: Family Medicine

## 2015-12-06 ENCOUNTER — Encounter: Payer: Self-pay | Admitting: Family Medicine

## 2015-12-06 VITALS — BP 101/66 | HR 77 | Temp 97.3°F | Ht 64.0 in | Wt 159.4 lb

## 2015-12-06 DIAGNOSIS — F411 Generalized anxiety disorder: Secondary | ICD-10-CM

## 2015-12-06 DIAGNOSIS — F17200 Nicotine dependence, unspecified, uncomplicated: Secondary | ICD-10-CM | POA: Diagnosis not present

## 2015-12-06 DIAGNOSIS — M533 Sacrococcygeal disorders, not elsewhere classified: Secondary | ICD-10-CM

## 2015-12-06 DIAGNOSIS — M4126 Other idiopathic scoliosis, lumbar region: Secondary | ICD-10-CM | POA: Diagnosis not present

## 2015-12-06 DIAGNOSIS — M419 Scoliosis, unspecified: Secondary | ICD-10-CM

## 2015-12-06 DIAGNOSIS — F17210 Nicotine dependence, cigarettes, uncomplicated: Secondary | ICD-10-CM

## 2015-12-06 NOTE — Progress Notes (Signed)
Subjective:  Patient ID: Margaret Barker, female    DOB: [birth year]  Age: 45 y.o. MRN: [id number]  CC: surgical clearance   HPI Margaret Barker presents for Planned for Mrch 9. R SI fusion by Dr. Rolena Infante at [place].. Last smoked marijuana 6 mos. Ago. Last EtOH 5 years. Smoking 1/2 ppd.for 7 years, 1-2 ppd for 22 years for total approx 35 pack-years Depression stable. Anxious at times controlled by meds - cymbalta and buspar.  Patient in for follow-up of GERD. Currently asymptomatic so not taking  PPI daily. There is no chest pain or heartburn. No hematemesis and no melena. No dysphagia or choking. Onset is remote. Progression is stable. Complicating factors, none.      Depression screen [facility] 2/9 12/06/2015 08/22/2015 08/08/2015 07/06/2015 06/26/2015  Decreased Interest 0 0 0 0 0  Down, Depressed, Hopeless 0 0 0 0 1  PHQ - 2 Score 0 0 0 0 1  Altered sleeping - - - - -  Tired, decreased energy - - - - -  Change in appetite - - - - -  Feeling bad or failure about yourself  - - - - -  Trouble concentrating - - - - -  Moving slowly or fidgety/restless - - - - -  Suicidal thoughts - - - - -  PHQ-9 Score - - - - -       History Margaret Barker has a past medical history of Alcoholism ([place]); Bipolar disorder ([place]); Anxiety; Depression; Panic attacks; Frequency of urination; SUI (stress urinary incontinence, female); Nocturia; Hyperlipidemia; History of panic attacks; GERD (gastroesophageal reflux disease); Helicobacter pylori gastritis; Esophagitis; Erosion of vaginal wall due to surgical mesh; and Wears glasses.   She has past surgical history that includes [place] (08-09-2009 DR Gaynelle Arabian); LEFT BREAST EXCISIONAL BX (03-26-2007); Laparoscopic assisted vaginal hysterectomy (10-22-2006); RIGHT EXCISIONAL BREAST BX (10-23-2005); Cholecystectomy (1992); D & C HYSTEROSCOPY W/ POLYP REMOVAL (04-22-2006); Lesion  removal (07/09/2012); Tubal ligation; Pubovaginal sling (N/A, 04/24/2014); Cystoscopy (N/A, 04/24/2014); Abdominal hysterectomy; Colonoscopy (N/A, 10/23/2014); and Esophagogastroduodenoscopy (egd) with propofol (N/A, 10/23/2014).   Her family history includes COPD in her father; Cervical cancer in her paternal grandmother; Colitis in her father and sister; Colon cancer in her paternal uncle; Diabetes in her mother; Heart disease in her father; Hypertension in her father; Liver cancer in her paternal uncle.She reports that she has been smoking Cigarettes.  She has a 24 pack-year smoking history. She has never used smokeless tobacco. She reports that she uses illicit drugs (Marijuana). She reports that she does not drink alcohol.    ROS Review of Systems  Constitutional: Negative for fever, activity change and appetite change.  HENT: Negative for congestion, rhinorrhea and sore throat.   Eyes: Negative for visual disturbance.  Respiratory: Negative for cough and shortness of breath.   Cardiovascular: Negative for chest pain and palpitations.  Gastrointestinal: Negative for nausea, abdominal pain and diarrhea.  Genitourinary: Negative for dysuria.  Musculoskeletal: Negative for myalgias and arthralgias.    Objective:  BP 101/66 mmHg  Pulse 77  Temp(Src) 97.3 F (36.3 C) (Oral)  Ht 5\' 4"  (1.626 m)  Wt 159 lb 6.4 oz (72.303 kg)  BMI 27.35 kg/m2  SpO2 98%  BP Readings from Last 3 Encounters:  12/06/15 101/66  10/24/15 97/67  08/22/15 96/65    Wt Readings from Last 3 Encounters:  12/06/15 159 lb 6.4 oz (72.303 kg)  10/24/15 159 lb (72.122 kg)  08/22/15 160 lb (72.576 kg)     Physical Exam  Constitutional: She is oriented to person, place, and time. She appears well-developed and well-nourished. No distress.  HENT:  Head: Normocephalic and atraumatic.  Right Ear: External ear normal.  Left Ear: External ear normal.  Nose: Nose normal.  Mouth/Throat: Oropharynx is clear and moist.    Eyes: Conjunctivae and EOM are normal. Pupils are equal, round, and reactive to light.  Neck: Normal range of motion. Neck supple. No thyromegaly present.  Cardiovascular: Normal rate, regular rhythm and normal heart sounds.   No murmur heard. Pulmonary/Chest: Effort normal and breath sounds normal. No respiratory distress. She has no wheezes. She has no rales.  Abdominal: Soft. Bowel sounds are normal. She exhibits no distension. There is no tenderness.  Lymphadenopathy:    She has no cervical adenopathy.  Neurological: She is alert and oriented to person, place, and time. She has normal reflexes.  Skin: Skin is warm and dry.  Psychiatric: She has a normal mood and affect. Her behavior is normal. Judgment and thought content normal.     Lab Results  Component Value Date   WBC 8.4 07/20/2015   HGB 14.0 09/19/2014   HCT 42.3 07/20/2015   PLT 299 07/20/2015   GLUCOSE 74 07/20/2015   CHOL 281* 07/20/2015   TRIG 190* 07/20/2015   HDL 58 07/20/2015   LDLCALC 185* 07/20/2015   ALT 32 07/20/2015   AST 23 07/20/2015   NA 138 07/20/2015   K 4.3 07/20/2015   CL 98 07/20/2015   CREATININE 0.82 07/20/2015   BUN 10 07/20/2015   CO2 25 07/20/2015   TSH 1.380 07/20/2015    Mr Lumbar Spine Wo Contrast  08/17/2015  CLINICAL DATA:  45 year old female with right side lumbar back pain radiating to the hip for 6 months with no known injury. Initial encounter. EXAM: MRI LUMBAR SPINE WITHOUT CONTRAST TECHNIQUE: Multiplanar, multisequence MR imaging of the lumbar spine was performed. No intravenous contrast was administered. COMPARISON:  CT Abdomen and Pelvis 09/29/2014. FINDINGS: Mild levoconvex lumbar scoliosis. Normal lumbar segmentation demonstrated on the comparison. Mild degenerative appearing endplate marrow edema anteriorly and eccentric to the left at L3-L4. There is also marrow edema in the right L3-L4 facet joint which is degenerated (series 5, image 6). Minimal surrounding soft tissue  inflammation. No other acute osseous abnormality identified. Negative visualized sacrum and SI joints. Visualized lower thoracic spinal cord is normal with conus medularis at T12-L1. Visualized abdominal viscera are within normal limits. Aside from around the right L3-L4 facet joint, visualized paraspinal soft tissues are within normal limits. T11-T12:  Negative. T12-L1:  Negative. L1-L2:  Negative. L2-L3:  Negative disc.  Mild facet hypertrophy.  No stenosis. L3-L4: Trace anterolisthesis. Circumferential disc bulge. Moderate facet hypertrophy. Right side facet joint marrow edema (series 5, image 6). No spinal or lateral recess stenosis. Mild to moderate right L3 foraminal stenosis. L4-L5: Negative disc. Mild to moderate facet and ligament flavum hypertrophy greater on the left. Mild left L4 foraminal stenosis. L5-S1:  Negative disc.  Mild facet hypertrophy. IMPRESSION: 1. Levoconvex lumbar scoliosis. Symptomatic level suspected to be L3-L4 were there is chronic disc and facet degeneration with mild right facet joint and anterior endplate marrow edema compatible with acute exacerbation of the chronic arthritis. There is associated mild to moderate right L3 foraminal stenosis. 2. Mild lumbar spine degeneration elsewhere.  No spinal stenosis. Electronically Signed   By: Genevie Ann M.D.   On: 08/17/2015 18:13  Assessment & Plan:   Margaret Barker was seen today for surgical clearance.  Diagnoses and all orders for this visit:  Sacroiliac joint pain  Scoliosis of lumbar spine  GAD (generalized anxiety disorder)  Tobacco dependency -     PR BREATHING CAPACITY TEST  Smokes with greater than 30 pack year history -     PR BREATHING CAPACITY TEST      I have discontinued Margaret Barker glycopyrrolate, polyethylene glycol powder, Linaclotide, and omeprazole. I am also having her maintain her solifenacin, DULoxetine, atorvastatin, busPIRone, estradiol, ranitidine, traZODone, and Vitamin D (Ergocalciferol).  Meds  ordered this encounter  Medications  . traZODone (DESYREL) 100 MG tablet    Sig: TAKE THREE (3) TABLETS BY MOUTH AT BEDTIME, AS NEEDED    Refill:  2  . Vitamin D, Ergocalciferol, (DRISDOL) 50000 units CAPS capsule    Sig: Take 50,000 Units by mouth every 7 (seven) days.   Smoking cessation discused. Consider chantix.  Follow-up: Return if symptoms worsen or fail to improve.  Claretta Fraise, M.D.

## 2015-12-07 ENCOUNTER — Ambulatory Visit: Payer: Self-pay | Admitting: Physician Assistant

## 2015-12-10 DIAGNOSIS — F329 Major depressive disorder, single episode, unspecified: Secondary | ICD-10-CM | POA: Diagnosis not present

## 2015-12-17 ENCOUNTER — Encounter (HOSPITAL_COMMUNITY)
Admission: RE | Admit: 2015-12-17 | Discharge: 2015-12-17 | Disposition: A | Discharge status: Home / Self Care | Payer: Medicare Other | Source: Ambulatory Visit | Attending: Orthopedic Surgery | Admitting: Orthopedic Surgery

## 2015-12-17 ENCOUNTER — Encounter (HOSPITAL_COMMUNITY): Payer: Self-pay

## 2015-12-17 HISTORY — DX: Abnormal levels of other serum enzymes: R74.8

## 2015-12-17 HISTORY — DX: Unspecified osteoarthritis, unspecified site: M19.90

## 2015-12-17 LAB — COMPREHENSIVE METABOLIC PANEL
ALK PHOS: 55 U/L (ref 38–126)
ALT: 23 U/L (ref 14–54)
AST: 21 U/L (ref 15–41)
Albumin: 4.3 g/dL (ref 3.5–5.0)
Anion gap: 10 (ref 5–15)
BILIRUBIN TOTAL: 0.5 mg/dL (ref 0.3–1.2)
BUN: 11 mg/dL (ref 6–20)
CO2: 22 mmol/L (ref 22–32)
CREATININE: 0.88 mg/dL (ref 0.44–1.00)
Calcium: 9.7 mg/dL (ref 8.9–10.3)
Chloride: 107 mmol/L (ref 101–111)
GFR calc Af Amer: >60 mL/min (ref >60)
Glucose, Bld: 89 mg/dL (ref 65–99)
Potassium: 4.4 mmol/L (ref 3.5–5.1)
Sodium: 139 mmol/L (ref 135–145)
TOTAL PROTEIN: 7.4 g/dL (ref 6.5–8.1)

## 2015-12-17 LAB — CBC
HEMATOCRIT: 43.2 % (ref 36.0–46.0)
Hemoglobin: 14.1 g/dL (ref 12.0–15.0)
MCH: 30.2 pg (ref 26.0–34.0)
MCHC: 32.6 g/dL (ref 30.0–36.0)
MCV: 92.5 fL (ref 78.0–100.0)
Platelets: 277 10*3/uL (ref 150–400)
RBC: 4.67 MIL/uL (ref 3.87–5.11)
RDW: 14.5 % (ref 11.5–15.5)
WBC: 12.4 10*3/uL — AB (ref 4.0–10.5)

## 2015-12-17 NOTE — Pre-Procedure Instructions (Signed)
Margaret Barker  12/17/2015      [id number] - [place], [place] - [address] [place] [address] Phone: [phone] Fax: [phone]  [id number] - [place], [place] [address] Phone: [phone] Fax: [phone]    Your procedure is scheduled on 12/20/2015   Report to [facility] Admitting at 8:30 A.M.  Call this number if you have problems the morning of surgery:  [phone]   Remember:  Do not eat food or drink liquids after midnight.  On WEDNESDAY    Take these medicines the morning of surgery with A SIP OF WATER : Tylenol, Buspar, Cymbalta, Estrace, Zantac, Vesicare    Do not wear jewelry, make-up or nail polish.   Do not wear lotions, powders, or perfumes.  You may wear deodorant.   Do not shave 48 hours prior to surgery.     Do not bring valuables to the hospital.   [facility] is not responsible for any belongings or valuables.  Contacts, dentures or bridgework may not be worn into surgery.  Leave your suitcase in the car.  After surgery it may be brought to your room.  For patients admitted to the hospital, discharge time will be determined by your treatment team.  Patients discharged the day of surgery will not be allowed to drive home.   Name and phone number of your driver:  With spouse  Special instructions:  Special Instructions: Margaret Barker - Preparing for Surgery  Before surgery, you can play an important role.  Because skin is not sterile, your skin needs to be as free of germs as possible.  You can reduce the number of germs on you skin by washing with CHG (chlorahexidine gluconate) soap before surgery.  CHG is an antiseptic cleaner which kills germs and bonds with the skin to continue killing germs even after washing.  Please DO NOT use if you have an allergy to CHG or antibacterial soaps.  If your skin becomes reddened/irritated stop using the CHG and  inform your nurse when you arrive at Short Stay.  Do not shave (including legs and underarms) for at least 48 hours prior to the first CHG shower.  You may shave your face.  Please follow these instructions carefully:   1.  Shower with CHG Soap the night before surgery and the  morning of Surgery.  2.  If you choose to wash your hair, wash your hair first as usual with your  normal shampoo.  3.  After you shampoo, rinse your hair and body thoroughly to remove the  Shampoo.  4.  Use CHG as you would any other liquid soap.  You can apply chg directly to the skin and wash gently with scrungie or a clean washcloth.  5.  Apply the CHG Soap to your body ONLY FROM THE NECK DOWN.    Do not use on open wounds or open sores.  Avoid contact with your eyes, ears, mouth and genitals (private parts).  Wash genitals (private parts)   with your normal soap.  6.  Wash thoroughly, paying special attention to the area where your surgery will be performed.  7.  Thoroughly rinse your body with warm water from the neck down.  8.  DO NOT shower/wash with your normal soap after using and rinsing off   the CHG Soap.  9.  Pat yourself dry with a clean towel.  10.  Wear clean pajamas.            11.  Place clean sheets on your bed the night of your first shower and do not sleep with pets.  Day of Surgery  Do not apply any lotions/deodorants the morning of surgery.  Please wear clean clothes to the hospital/surgery center.  Please read over the following fact sheets that you were given. Pain Booklet, Coughing and Deep Breathing and Surgical Site Infection Prevention

## 2015-12-17 NOTE — Progress Notes (Signed)
Pt. Is followed by [facility] denies ever needing or having a cardiology consult.  Denies any advance cardiac surveillance in the past.

## 2015-12-18 NOTE — Progress Notes (Signed)
Anesthesia Chart Review:  Pt is a 45 year old female scheduled for R sacroiliac fusion on 12/17/2015 with Dr. Rolena Infante.   PMH includes:  Hyperlipidemia, alcohol abuse (none since 2013), GERD. Current smoker. BMI 27.   Medications include: lipitor, zantac.   Preoperative labs reviewed.    If no changes, I anticipate pt can proceed with surgery as scheduled.   Willeen Cass, FNP-BC [facility]/Anesthesiology Phone: [phone] 12/18/2015 1:48 PM

## 2015-12-19 MED ORDER — CEFAZOLIN SODIUM-DEXTROSE 2-3 GM-% IV SOLR
2.0000 g | INTRAVENOUS | Status: AC
  Administered 2015-12-20: 2 g via INTRAVENOUS
  Filled 2015-12-19: qty 50

## 2015-12-20 ENCOUNTER — Encounter (HOSPITAL_COMMUNITY)
Admission: RE | Disposition: A | Discharge status: Home / Self Care | Payer: Self-pay | Source: Ambulatory Visit | Attending: Orthopedic Surgery

## 2015-12-20 ENCOUNTER — Inpatient Hospital Stay (HOSPITAL_COMMUNITY)
Admission: RE | Admit: 2015-12-20 | Discharge: 2015-12-21 | DRG: 460 | Disposition: A | Discharge status: Home / Self Care | Payer: Medicare Other | Source: Ambulatory Visit | Attending: Orthopedic Surgery | Admitting: Orthopedic Surgery

## 2015-12-20 ENCOUNTER — Inpatient Hospital Stay (HOSPITAL_COMMUNITY): Payer: Medicare Other

## 2015-12-20 ENCOUNTER — Inpatient Hospital Stay (HOSPITAL_COMMUNITY): Payer: Medicare Other | Admitting: Certified Registered Nurse Anesthetist

## 2015-12-20 ENCOUNTER — Inpatient Hospital Stay (HOSPITAL_COMMUNITY): Payer: Medicare Other | Admitting: Emergency Medicine

## 2015-12-20 ENCOUNTER — Encounter (HOSPITAL_COMMUNITY): Payer: Self-pay | Admitting: *Deleted

## 2015-12-20 DIAGNOSIS — M533 Sacrococcygeal disorders, not elsewhere classified: Principal | ICD-10-CM | POA: Diagnosis present

## 2015-12-20 DIAGNOSIS — F1020 Alcohol dependence, uncomplicated: Secondary | ICD-10-CM | POA: Diagnosis present

## 2015-12-20 DIAGNOSIS — F17210 Nicotine dependence, cigarettes, uncomplicated: Secondary | ICD-10-CM | POA: Diagnosis not present

## 2015-12-20 DIAGNOSIS — F419 Anxiety disorder, unspecified: Secondary | ICD-10-CM | POA: Diagnosis present

## 2015-12-20 DIAGNOSIS — K219 Gastro-esophageal reflux disease without esophagitis: Secondary | ICD-10-CM | POA: Diagnosis present

## 2015-12-20 DIAGNOSIS — Z419 Encounter for procedure for purposes other than remedying health state, unspecified: Secondary | ICD-10-CM

## 2015-12-20 DIAGNOSIS — M1990 Unspecified osteoarthritis, unspecified site: Secondary | ICD-10-CM | POA: Diagnosis not present

## 2015-12-20 DIAGNOSIS — E785 Hyperlipidemia, unspecified: Secondary | ICD-10-CM | POA: Diagnosis present

## 2015-12-20 DIAGNOSIS — F319 Bipolar disorder, unspecified: Secondary | ICD-10-CM | POA: Diagnosis present

## 2015-12-20 DIAGNOSIS — F410 Panic disorder [episodic paroxysmal anxiety] without agoraphobia: Secondary | ICD-10-CM | POA: Diagnosis present

## 2015-12-20 DIAGNOSIS — Z825 Family history of asthma and other chronic lower respiratory diseases: Secondary | ICD-10-CM

## 2015-12-20 HISTORY — PX: SACROILIAC JOINT FUSION: SHX6088

## 2015-12-20 SURGERY — SACROILIAC JOINT FUSION
Anesthesia: General | Site: Pelvis | Laterality: Right

## 2015-12-20 MED ORDER — METHOCARBAMOL 500 MG PO TABS: 500.0000 mg | ORAL_TABLET | Freq: Three times a day (TID) | ORAL | Status: DC | PRN

## 2015-12-20 MED ORDER — PROPOFOL 10 MG/ML IV BOLUS
INTRAVENOUS | Status: DC | PRN
  Administered 2015-12-20: 180 mg via INTRAVENOUS
  Administered 2015-12-20: 20 mg via INTRAVENOUS

## 2015-12-20 MED ORDER — ROCURONIUM BROMIDE 100 MG/10ML IV SOLN
INTRAVENOUS | Status: DC | PRN
  Administered 2015-12-20: 40 mg via INTRAVENOUS

## 2015-12-20 MED ORDER — MIDAZOLAM HCL 2 MG/2ML IJ SOLN
INTRAMUSCULAR | Status: AC
  Administered 2015-12-20: 1 mg via INTRAVENOUS
  Filled 2015-12-20: qty 2

## 2015-12-20 MED ORDER — FENTANYL CITRATE (PF) 250 MCG/5ML IJ SOLN
INTRAMUSCULAR | Status: AC
  Filled 2015-12-20: qty 5

## 2015-12-20 MED ORDER — METHOCARBAMOL 1000 MG/10ML IJ SOLN
500.0000 mg | Freq: Four times a day (QID) | INTRAVENOUS | Status: DC | PRN
  Filled 2015-12-20: qty 5

## 2015-12-20 MED ORDER — LACTATED RINGERS IV SOLN
INTRAVENOUS | Status: DC
  Administered 2015-12-20: 09:00:00 via INTRAVENOUS

## 2015-12-20 MED ORDER — LACTATED RINGERS IV SOLN: INTRAVENOUS | Status: DC

## 2015-12-20 MED ORDER — ONDANSETRON HCL 4 MG/2ML IJ SOLN
INTRAMUSCULAR | Status: DC | PRN
  Administered 2015-12-20: 4 mg via INTRAVENOUS

## 2015-12-20 MED ORDER — MORPHINE SULFATE (PF) 2 MG/ML IV SOLN
1.0000 mg | INTRAVENOUS | Status: DC | PRN
  Administered 2015-12-20 – 2015-12-21 (×3): 4 mg via INTRAVENOUS
  Filled 2015-12-20 (×3): qty 2

## 2015-12-20 MED ORDER — HYDROMORPHONE HCL 1 MG/ML IJ SOLN
INTRAMUSCULAR | Status: AC
  Administered 2015-12-20: 0.5 mg via INTRAVENOUS
  Filled 2015-12-20: qty 1

## 2015-12-20 MED ORDER — MIDAZOLAM HCL 2 MG/2ML IJ SOLN
INTRAMUSCULAR | Status: AC
  Filled 2015-12-20: qty 2

## 2015-12-20 MED ORDER — SODIUM CHLORIDE 0.9 % IV SOLN: 250.0000 mL | INTRAVENOUS | Status: DC

## 2015-12-20 MED ORDER — OXYCODONE HCL 5 MG PO TABS
ORAL_TABLET | ORAL | Status: AC
  Administered 2015-12-20: 10 mg via ORAL
  Filled 2015-12-20: qty 2

## 2015-12-20 MED ORDER — MENTHOL 3 MG MT LOZG: 1.0000 | LOZENGE | OROMUCOSAL | Status: DC | PRN

## 2015-12-20 MED ORDER — HYDROMORPHONE HCL 1 MG/ML IJ SOLN
0.2500 mg | INTRAMUSCULAR | Status: DC | PRN
  Administered 2015-12-20 (×4): 0.5 mg via INTRAVENOUS

## 2015-12-20 MED ORDER — ONDANSETRON HCL 4 MG/2ML IJ SOLN
INTRAMUSCULAR | Status: AC
  Filled 2015-12-20: qty 2

## 2015-12-20 MED ORDER — LACTATED RINGERS IV SOLN
INTRAVENOUS | Status: DC | PRN
  Administered 2015-12-20 (×2): via INTRAVENOUS

## 2015-12-20 MED ORDER — SODIUM CHLORIDE 0.9% FLUSH
3.0000 mL | Freq: Two times a day (BID) | INTRAVENOUS | Status: DC
  Administered 2015-12-20: 3 mL via INTRAVENOUS

## 2015-12-20 MED ORDER — BUSPIRONE HCL 10 MG PO TABS
10.0000 mg | ORAL_TABLET | Freq: Three times a day (TID) | ORAL | Status: DC
  Administered 2015-12-20: 10 mg via ORAL
  Filled 2015-12-20 (×5): qty 1

## 2015-12-20 MED ORDER — FENTANYL CITRATE (PF) 250 MCG/5ML IJ SOLN
INTRAMUSCULAR | Status: DC | PRN
  Administered 2015-12-20 (×2): 50 ug via INTRAVENOUS
  Administered 2015-12-20: 100 ug via INTRAVENOUS
  Administered 2015-12-20 (×4): 50 ug via INTRAVENOUS

## 2015-12-20 MED ORDER — MIDAZOLAM HCL 5 MG/5ML IJ SOLN
INTRAMUSCULAR | Status: DC | PRN
  Administered 2015-12-20: 2 mg via INTRAVENOUS

## 2015-12-20 MED ORDER — CEFAZOLIN SODIUM 1-5 GM-% IV SOLN
1.0000 g | Freq: Three times a day (TID) | INTRAVENOUS | Status: AC
  Administered 2015-12-20 – 2015-12-21 (×2): 1 g via INTRAVENOUS
  Filled 2015-12-20 (×2): qty 50

## 2015-12-20 MED ORDER — ROCURONIUM BROMIDE 50 MG/5ML IV SOLN
INTRAVENOUS | Status: AC
  Filled 2015-12-20: qty 1

## 2015-12-20 MED ORDER — METHOCARBAMOL 500 MG PO TABS
500.0000 mg | ORAL_TABLET | Freq: Four times a day (QID) | ORAL | Status: DC | PRN
  Administered 2015-12-20 – 2015-12-21 (×4): 500 mg via ORAL
  Filled 2015-12-20 (×3): qty 1

## 2015-12-20 MED ORDER — ONDANSETRON HCL 4 MG PO TABS: 4.0000 mg | ORAL_TABLET | Freq: Three times a day (TID) | ORAL | Status: DC | PRN

## 2015-12-20 MED ORDER — ONDANSETRON HCL 4 MG/2ML IJ SOLN: 4.0000 mg | INTRAMUSCULAR | Status: DC | PRN

## 2015-12-20 MED ORDER — PROPOFOL 10 MG/ML IV BOLUS
INTRAVENOUS | Status: AC
  Filled 2015-12-20: qty 20

## 2015-12-20 MED ORDER — SUGAMMADEX SODIUM 200 MG/2ML IV SOLN
INTRAVENOUS | Status: AC
  Filled 2015-12-20: qty 2

## 2015-12-20 MED ORDER — 0.9 % SODIUM CHLORIDE (POUR BTL) OPTIME
TOPICAL | Status: DC | PRN
  Administered 2015-12-20: 1000 mL

## 2015-12-20 MED ORDER — SODIUM CHLORIDE 0.9% FLUSH: 3.0000 mL | INTRAVENOUS | Status: DC | PRN

## 2015-12-20 MED ORDER — METHOCARBAMOL 500 MG PO TABS
ORAL_TABLET | ORAL | Status: AC
  Filled 2015-12-20: qty 1

## 2015-12-20 MED ORDER — LIDOCAINE HCL (CARDIAC) 20 MG/ML IV SOLN
INTRAVENOUS | Status: DC | PRN
  Administered 2015-12-20: 50 mg via INTRAVENOUS

## 2015-12-20 MED ORDER — BUPIVACAINE-EPINEPHRINE (PF) 0.25% -1:200000 IJ SOLN
INTRAMUSCULAR | Status: AC
  Filled 2015-12-20: qty 30

## 2015-12-20 MED ORDER — PHENOL 1.4 % MT LIQD: 1.0000 | OROMUCOSAL | Status: DC | PRN

## 2015-12-20 MED ORDER — OXYCODONE HCL 5 MG PO TABS
10.0000 mg | ORAL_TABLET | ORAL | Status: DC | PRN
  Administered 2015-12-20 – 2015-12-21 (×6): 10 mg via ORAL
  Filled 2015-12-20 (×5): qty 2

## 2015-12-20 MED ORDER — LIDOCAINE HCL (CARDIAC) 20 MG/ML IV SOLN
INTRAVENOUS | Status: AC
  Filled 2015-12-20: qty 5

## 2015-12-20 MED ORDER — BUPIVACAINE-EPINEPHRINE 0.25% -1:200000 IJ SOLN
INTRAMUSCULAR | Status: DC | PRN
Start: 2015-12-20 — End: 2015-12-20
  Administered 2015-12-20: 8 mL

## 2015-12-20 MED ORDER — MIDAZOLAM HCL 2 MG/2ML IJ SOLN
1.0000 mg | INTRAMUSCULAR | Status: AC | PRN
  Administered 2015-12-20 (×3): 1 mg via INTRAVENOUS

## 2015-12-20 MED ORDER — DULOXETINE HCL 30 MG PO CPEP: 90.0000 mg | ORAL_CAPSULE | Freq: Every day | ORAL | Status: DC

## 2015-12-20 MED ORDER — OXYCODONE-ACETAMINOPHEN 10-325 MG PO TABS: 1.0000 | ORAL_TABLET | ORAL | Status: DC | PRN

## 2015-12-20 MED ORDER — SUGAMMADEX SODIUM 200 MG/2ML IV SOLN
INTRAVENOUS | Status: DC | PRN
  Administered 2015-12-20: 145.8 mg via INTRAVENOUS

## 2015-12-20 SURGICAL SUPPLY — 48 items
BLADE SURG ROTATE 9660 (MISCELLANEOUS) IMPLANT
CAGE DUAL SACRO ANCR 12.5X50 (Cage) ×2 IMPLANT
CLOSURE STERI-STRIP 1/2X4 (GAUZE/BANDAGES/DRESSINGS) ×1
CLSR STERI-STRIP ANTIMIC 1/2X4 (GAUZE/BANDAGES/DRESSINGS) ×2 IMPLANT
COVER SURGICAL LIGHT HANDLE (MISCELLANEOUS) ×3 IMPLANT
DRAPE C-ARM 42X72 X-RAY (DRAPES) ×3 IMPLANT
DRAPE C-ARMOR (DRAPES) ×3 IMPLANT
DRAPE SURG 17X23 STRL (DRAPES) ×3 IMPLANT
DRAPE U-SHAPE 47X51 STRL (DRAPES) ×3 IMPLANT
DRSG MEPILEX BORDER 4X4 (GAUZE/BANDAGES/DRESSINGS) ×3 IMPLANT
DRSG MEPILEX BORDER 4X8 (GAUZE/BANDAGES/DRESSINGS) ×1 IMPLANT
DURAPREP 26ML APPLICATOR (WOUND CARE) ×3 IMPLANT
ELECT BLADE 4.0 EZ CLEAN MEGAD (MISCELLANEOUS) ×3
ELECT PENCIL ROCKER SW 15FT (MISCELLANEOUS) ×3 IMPLANT
ELECT REM PT RETURN 9FT ADLT (ELECTROSURGICAL) ×3
ELECTRODE BLDE 4.0 EZ CLN MEGD (MISCELLANEOUS) ×1 IMPLANT
ELECTRODE REM PT RTRN 9FT ADLT (ELECTROSURGICAL) ×1 IMPLANT
GLOVE BIOGEL PI IND STRL 8.5 (GLOVE) ×1 IMPLANT
GLOVE BIOGEL PI INDICATOR 8.5 (GLOVE) ×2
GLOVE SS BIOGEL STRL SZ 8.5 (GLOVE) ×1 IMPLANT
GLOVE SUPERSENSE BIOGEL SZ 8.5 (GLOVE) ×2
GOWN STRL REUS W/ TWL LRG LVL3 (GOWN DISPOSABLE) ×1 IMPLANT
GOWN STRL REUS W/TWL 2XL LVL3 (GOWN DISPOSABLE) ×3 IMPLANT
GOWN STRL REUS W/TWL LRG LVL3 (GOWN DISPOSABLE) ×3
KIT BASIN OR (CUSTOM PROCEDURE TRAY) ×3 IMPLANT
KIT ROOM TURNOVER OR (KITS) ×3 IMPLANT
MIX DBX 10CC 35% BONE (Bone Implant) ×2 IMPLANT
NEEDLE 22X1 1/2 (OR ONLY) (NEEDLE) ×3 IMPLANT
NS IRRIG 1000ML POUR BTL (IV SOLUTION) ×3 IMPLANT
PACK LAMINECTOMY ORTHO (CUSTOM PROCEDURE TRAY) ×3 IMPLANT
PACK UNIVERSAL I (CUSTOM PROCEDURE TRAY) ×3 IMPLANT
PAD ARMBOARD 7.5X6 YLW CONV (MISCELLANEOUS) ×6 IMPLANT
POSITIONER HEAD PRONE TRACH (MISCELLANEOUS) ×3 IMPLANT
SCREW DUAL THRD SACRO LCK 7X40 (Screw) ×3 IMPLANT
SCREW SACROIL DUL THRD 12.5X45 (Screw) ×1 IMPLANT
SCRW SACROIL DUAL THRD 12.5X45 (Screw) ×3 IMPLANT
STAPLER VISISTAT 35W (STAPLE) ×2 IMPLANT
SUT MNCRL AB 3-0 PS2 18 (SUTURE) ×2 IMPLANT
SUT VIC AB 1 CT1 18XCR BRD 8 (SUTURE) ×1 IMPLANT
SUT VIC AB 1 CT1 8-18 (SUTURE) ×3
SUT VIC AB 2-0 CT1 18 (SUTURE) ×3 IMPLANT
SYR BULB IRRIGATION 50ML (SYRINGE) ×3 IMPLANT
SYR CONTROL 10ML LL (SYRINGE) ×3 IMPLANT
TOWEL OR 17X24 6PK STRL BLUE (TOWEL DISPOSABLE) ×3 IMPLANT
TOWEL OR 17X26 10 PK STRL BLUE (TOWEL DISPOSABLE) ×3 IMPLANT
WASHER 13 (Washer) ×2 IMPLANT
WATER STERILE IRR 1000ML POUR (IV SOLUTION) ×3 IMPLANT
YANKAUER SUCT BULB TIP NO VENT (SUCTIONS) ×3 IMPLANT

## 2015-12-20 NOTE — Transfer of Care (Signed)
Immediate Anesthesia Transfer of Care Note  Patient: Margaret Barker  Procedure(s) Performed: Procedure(s): RIGHT SACROILIAC  FUSION (Right)  Patient Location: PACU  Anesthesia Type:General  Level of Consciousness: awake, alert  and oriented  Airway & Oxygen Therapy: Patient Spontanous Breathing and Patient connected to nasal cannula oxygen  Post-op Assessment: Report given to RN and Post -op Vital signs reviewed and stable  Post vital signs: Reviewed and stable  Last Vitals:  Filed Vitals:   12/20/15 0830  BP: 106/66  Pulse: 69  Temp: 36.4 C  Resp: 20    Complications: No apparent anesthesia complications

## 2015-12-20 NOTE — Op Note (Signed)
Margaret Barker, Margaret Barker                 ACCOUNT NO.:  [id number]  MEDICAL RECORD NO.:  [id number]  LOCATION:  [facility]                         FACILITY:  [facility]  PHYSICIAN:  Alesandra Smart D. Rolena Infante, M.D. DATE OF BIRTH:  09-06-1971  DATE OF PROCEDURE:  12/20/2015 DATE OF DISCHARGE:                              OPERATIVE REPORT   PREOPERATIVE DIAGNOSIS:  Sacroiliac joint pain/sacroiliac joint dysfunction.  POSTOPERATIVE DIAGNOSIS:  Sacroiliac joint pain/sacroiliac joint dysfunction.  OPERATIVE PROCEDURE:  Sacroiliac fusion.  INSTRUMENTATION SYSTEM USED:  XI bone system with a 50-mm Herbert lagging screw, 45-mm Herbert lagging screw and a 40-mm 6.5 cannulated wheel washer.  COMPLICATIONS:  None.  CONDITION:  Stable.  HISTORY:  This is a very pleasant 45 year old woman who has been having severe debilitating right gluteal pain consistent with SI joint pain. This was confirmed with injection therapy.  After discussing treatment options, we elected to proceed with an SI fusion.  All appropriate risks, benefits, and alternatives were discussed with the patient and consent was obtained.  OPERATIVE NOTE:  The patient was brought to the operating room and placed supine on the operating table.  After successful induction of general anesthesia and endotracheal intubation, TEDs, SCDs were applied. She was turned prone onto the [place] frame.  All bony prominences were well padded and the right gluteal area was prepped and draped in standard fashion.  Time-out was then taken to confirm the patient, procedure and all other pertinent important data.  Once this was completed, using lateral fluoroscopy, I identified the sacral ala and posterior and anterior margins of the sacrum.  These were all drawn out in surface anatomy on the skin.  I then advanced the guidepin percutaneously down to the lateral aspect of the pelvis.  I confirmed satisfactory position with respect to the ala and the SI joint in  the lateral inlet and outlet view.  I then advanced the guidepin through the pelvis across the SI joint and into the sacrum.  I confirmed satisfactory position in the inlet and outlet view as well as the lateral view.  The tip of the guidepin was cephalad to the S1 neural foramen.  On the inlet view, there was no evidence of anterior or posterior breech.  Once this guidepin was properly secured, I used the guide itself to place the second pin and the third pin in a similar fashion.  Again, I used lateral inlet and outlet views to confirm trajectory and positioning.  Once all three pins were in place, I connected them for a single incision and then drilled over the first one and then tapped.  I then used a curette to decorticate the SI joint and then, I placed the screw to the appropriate depth.  This was the 50-mm length screws, had excellent purchase and was still lateral to the foramen.  I repeated this using a 45-mm screw for the second one.  Both screws had good fixation.  My final screw was the cannulated 6.5 with a washer and this also got good purchase.  Once all the guidepins were removed, I irrigated the wound copiously with normal saline, closed in a layered fashion with interrupted #1 Vicryl sutures, 2-0  Vicryl sutures, and 3-0 Monocryl.  Steri-Strips and dry dressing were applied.  X-rays were taken in lateral inlet and outlet views, which demonstrated satisfactory position of all three screws.  No apparent complicating features.  The patient was ultimately extubated, transferred to the PACU without incident.  At the end of the case, all needle and sponge counts were correct.  There were no adverse intraoperative events.     Kaidence Sant D. Rolena Infante, M.D.     DDB/MEDQ  D:  12/20/2015  T:  12/20/2015  Job:  [id number]

## 2015-12-20 NOTE — Anesthesia Postprocedure Evaluation (Signed)
Anesthesia Post Note  Patient: Margaret Barker  Procedure(s) Performed: Procedure(s) (LRB): RIGHT SACROILIAC  FUSION (Right)  Patient location during evaluation: PACU Anesthesia Type: General Level of consciousness: awake and alert Pain management: pain level controlled Vital Signs Assessment: post-procedure vital signs reviewed and stable Respiratory status: spontaneous breathing, nonlabored ventilation, respiratory function stable and patient connected to nasal cannula oxygen Cardiovascular status: blood pressure returned to baseline and stable Postop Assessment: no signs of nausea or vomiting Anesthetic complications: no    Last Vitals:  Filed Vitals:   12/20/15 1415 12/20/15 1430  BP: 114/64   Pulse: 58 76  Temp:    Resp: 14 21    Last Pain:  Filed Vitals:   12/20/15 1444  PainSc: 7                  Bridger Pizzi L

## 2015-12-20 NOTE — H&P (Signed)
History of Present Illness  The patient is a 45 year old female who presents today for follow up of their back. The patient is being followed for their bilateral SI joint pain "I want to get it fixed". They are now "over a year" out. Symptoms reported today include: pain (on the right and left low back, "not in the middle"), pain at night, aching, throbbing, catching (in the right side), pain with weightbearing, difficulty ambulating, difficulty arising from chair, weakness (right leg), burning (right buttock and lateral hip), urinary incontinence (previous issue), leg pain (right leg, laterally that stops at the ankle), pain with lying, pain with lifting, pain with sitting and pain with standing, while the patient does not report symptoms of: swelling, stiffness, locking, popping, pain with overhead motions, numbness or foot pain. The patient states that they are doing poorly. The following medication has been used for pain control: none. The patient reports their current pain level to be 5 / 10 (right now). The patient has reported improvement of their symptoms with: activity modification, conservative measures and Cortisone injections (Right SI joint in December of 2016 did help until now).  Subjective Transcription She states that the best she has felt was after the right SI injection in December. It is now starting to wear off.  Problem List/Past Medical Chronic right-sided low back pain with right-sided sciatica (M54.41)  Problems Reconciled   Allergies  Enablex *URINARY ANTISPASMODICS*  Headache. Gabapentin *ANTICONVULSANTS*  severe depression Lyrica *ANTICONVULSANTS*  depression is worse Allergies Reconciled   Family History Chronic Obstructive Lung Disease  father Diabetes Mellitus  mother Heart Disease  father Hypertension  mother and father Osteoporosis  mother  Social History  Tobacco use  current every day smoker; smoke(d) 1 pack(s) per day Tobacco / smoke exposure   yes Alcohol use  never consumed alcohol Children  3 Current work status  disabled Drug/Alcohol Rehab (Currently)  no Drug/Alcohol Rehab (Previously)  no Illicit drug use  no Living situation  live with spouse Marital status  married Pain Contract  no  Medication History BusPIRone HCl (10MG  Tablet, Oral) Active. (tid) Atorvastatin Calcium (40MG  Tablet, Oral) Active. (qd) DULoxetine HCl (30MG  Capsule DR Part, Oral) Active. (90 mg qd) Estradiol (1MG  Tablet, Oral) Active. (qd) TraZODone HCl (300MG  Tablet, Oral) Active. (qhs) Zantac (150MG  Tablet, Oral) Active. (bid) Medications Reconciled  Objective Transcription Clinically, she has SI joint pain with clinical palpation and with provocative testing such as Saralyn Pilar and Gleason. Negative straight leg raise test. No real hip, knee, or ankle pain with joint range of motion. No focal neurological deficits. She does get sciatic-type nerve pain after the SI joint burning buttock pain is present for some time. She has sitting intolerance and she cannot walk long distances. No significant midline lumbar spine pain. She has a regular rate and rhythm. Lungs are clear to auscultation.   Plans Transcription At this point in time using a model I have demonstrated the SI fusion surgery, and I have addressed all of her questions. Risks include infection, bleeding, nerve damage, death, stroke, paralysis, failure to heal, need for further surgery, hardware malposition, and nonunion. We will plan on getting the surgery done at [facility], as she has Medicare, as soon as we have preoperative medical clearance.

## 2015-12-20 NOTE — Anesthesia Procedure Notes (Signed)
Procedure Name: Intubation Date/Time: 12/20/2015 11:14 AM Performed by: Eligha Bridegroom Pre-anesthesia Checklist: Patient identified, Timeout performed, Emergency Drugs available, Suction available and Patient being monitored Patient Re-evaluated:Patient Re-evaluated prior to inductionOxygen Delivery Method: Circle system utilized Preoxygenation: Pre-oxygenation with 100% oxygen Intubation Type: IV induction Ventilation: Mask ventilation without difficulty Laryngoscope Size: Mac and 3 Grade View: Grade I Tube type: Oral Tube size: 7.0 mm Number of attempts: 1 Airway Equipment and Method: Stylet and LTA kit utilized Placement Confirmation: ETT inserted through vocal cords under direct vision,  breath sounds checked- equal and bilateral and positive ETCO2 Secured at: 21 cm Tube secured with: Tape Dental Injury: Teeth and Oropharynx as per pre-operative assessment

## 2015-12-20 NOTE — Progress Notes (Signed)
Utilization review completed.  

## 2015-12-20 NOTE — Brief Op Note (Signed)
12/20/2015  12:41 PM  PATIENT:  Margaret Barker  45 y.o. female  PRE-OPERATIVE DIAGNOSIS:  RIGHT SACROILIAC JOINT DYSFUNCTION   POST-OPERATIVE DIAGNOSIS:  RIGHT SACROILIAC JOINT DYSFUNCTION   PROCEDURE:  Procedure(s): RIGHT SACROILIAC  FUSION (Right)  SURGEON:  Surgeon(s) and Role:    * Melina Schools, MD - Primary  PHYSICIAN ASSISTANT:   ASSISTANTS: none   ANESTHESIA:   general  EBL:  Total I/O In: 1000 [I.V.:1000] Out: -   BLOOD ADMINISTERED:none  DRAINS: none   LOCAL MEDICATIONS USED:  MARCAINE     SPECIMEN:  No Specimen  DISPOSITION OF SPECIMEN:  N/A  COUNTS:  YES  TOURNIQUET:  * No tourniquets in log *  DICTATION: .Other Dictation: Dictation Number [id number]  PLAN OF CARE: Admit for overnight observation  PATIENT DISPOSITION:  PACU - hemodynamically stable.

## 2015-12-20 NOTE — Discharge Instructions (Signed)
Keep clean and dry Ok to shower in 6 days Call/goto ER if you have significant increase in pain, swelling, nerve pain in the right leg, difficulty breathing, or chest pain

## 2015-12-20 NOTE — Anesthesia Preprocedure Evaluation (Signed)
Anesthesia Evaluation  Patient identified by MRN, date of birth, ID band Patient awake    Reviewed: Allergy & Precautions, H&P , NPO status , Patient's Chart, lab work & pertinent test results  Airway Mallampati: II  TM Distance: >3 FB Neck ROM: Full    Dental no notable dental hx. (+) Teeth Intact, Dental Advisory Given   Pulmonary Current Smoker, former smoker,    Pulmonary exam normal breath sounds clear to auscultation       Cardiovascular Exercise Tolerance: Good negative cardio ROS Normal cardiovascular exam Rhythm:Regular Rate:Normal     Neuro/Psych PSYCHIATRIC DISORDERS Anxiety Depression Bipolar Disorder H/O anxiety d/o, bipolar d/o, alcoholism, panic attacksnegative neurological ROS  negative psych ROS   GI/Hepatic negative GI ROS, GERD  Medicated,(+)     substance abuse  alcohol use,   Endo/Other  negative endocrine ROS  Renal/GU negative Renal ROS  negative genitourinary   Musculoskeletal negative musculoskeletal ROS (+)   Abdominal   Peds negative pediatric ROS (+)  Hematology negative hematology ROS (+)   Anesthesia Other Findings   Reproductive/Obstetrics negative OB ROS                             Anesthesia Physical Anesthesia Plan  ASA: III  Anesthesia Plan: General   Post-op Pain Management:    Induction: Intravenous  Airway Management Planned: Oral ETT  Additional Equipment:   Intra-op Plan:   Post-operative Plan: Extubation in OR  Informed Consent:   Plan Discussed with: Surgeon  Anesthesia Plan Comments:         Anesthesia Quick Evaluation

## 2015-12-21 ENCOUNTER — Encounter (HOSPITAL_COMMUNITY): Payer: Self-pay | Admitting: Orthopedic Surgery

## 2015-12-21 NOTE — Progress Notes (Signed)
Pt and family given D/C instructions with Rx's, verbal understanding was provided. Pt's incision is clean and dry with no sign of infection. Pt's IV was removed prior to D/C. Pt received crutches and 3-n-1 prior to D/C. Pt D/C'd home via wheelchair @ 1100 per MD order. Pt is stable @ D/C and has no other needs at this time. Holli Humbles, RN

## 2015-12-21 NOTE — Evaluation (Signed)
Occupational Therapy Evaluation AND Discharge  Patient Details Name: Margaret Barker MRN: [id number] DOB: September 05, 1971 Today's Date: 12/21/2015    History of Present Illness 45 yo female s/p Right Sacroiliac Fusion on 12/20/15. Pt is now NWB to RLE.    Clinical Impression   Patient admitted with above. Patient independent PTA. Patient currently functioning at an overall supervision to occasional min guard assist level.  No additional OT needs identified, D/C from acute OT services and no additional follow-up OT needs at this time. All appropriate education provided to patient. Please re-order OT if needed.    Pt eager to go home today, states her husband is self-employed and will be able to assist prn. Encouraged pt to purchase hip kit to increase independence and safety with LB ADLs. Went over back precautions and importance of adhering to them. Pt adhered to NWB to RLE independently with no cueing required.     Follow Up Recommendations  No OT follow up;Supervision - Intermittent    Equipment Recommendations  3 in 1 bedside comode;Other (comment) (AE - reacher, sock aid, LH sponge, LH shoe horn)    Recommendations for Other Services  None at this time    Precautions / Restrictions Precautions Precautions: Back;Fall Restrictions Weight Bearing Restrictions: Yes RLE Weight Bearing: Non weight bearing    Mobility Bed Mobility Overal bed mobility: Modified Independent  Transfers Overall transfer level: Modified independent Equipment used: Crutches General transfer comment: increased time, no physical cues or verbal cues needed during simple sit to/from stand transfers.     Balance Overall balance assessment: Needs assistance Sitting-balance support: No upper extremity supported;Feet supported Sitting balance-Leahy Scale: Normal     Standing balance support: Single extremity supported;During functional activity Standing balance-Leahy Scale: Fair Standing balance comment: uses  crutches for functional ambulation/mobility at this time due to NWB to RLE.    ADL Overall ADL's : Needs assistance/impaired Eating/Feeding: Set up;Sitting   Grooming: Set up;Sitting   Upper Body Bathing: Set up;Sitting   Lower Body Bathing: Set up;Sit to/from stand;With adaptive equipment   Upper Body Dressing : Set up;Sitting   Lower Body Dressing: Set up;Sit to/from stand;With adaptive equipment;Adhering to back precautions   Toilet Transfer: Media planner Details (indicate cue type and reason): using crutches  Toileting- Clothing Manipulation and Hygiene: Supervision/safety;Sit to/from stand   Tub/ Shower Transfer: Anterior/posterior;Min guard;Walk-in Lobbyist Details (indicate cue type and reason): simulated  Functional mobility during ADLs: Supervision/safety;Min guard General ADL Comments: Pt using crutches for functional mobility and ambulation. Pt eager to go home today, no additional OT needs identified.     Vision Additional Comments: no change from baseline          Pertinent Vitals/Pain Pain Assessment: 0-10 Pain Score: 8  Pain Location: R side of back/LE Pain Descriptors / Indicators: Aching;Sore;Guarding;Grimacing Pain Intervention(s): Limited activity within patient's tolerance;Monitored during session;Repositioned;Ice applied     Hand Dominance Right   Extremity/Trunk Assessment Upper Extremity Assessment Upper Extremity Assessment: Overall WFL for tasks assessed   Lower Extremity Assessment Lower Extremity Assessment: Defer to PT evaluation   Cervical / Trunk Assessment Cervical / Trunk Assessment: Normal   Communication Communication Communication: No difficulties   Cognition Arousal/Alertness: Awake/alert Behavior During Therapy: WFL for tasks assessed/performed Overall Cognitive Status: Within Functional Limits for tasks assessed              Home Living Family/patient expects to be  discharged to:: Private residence Living Arrangements: Spouse/significant other Available Help at Discharge: Family;Available 24  hours/day (husband self-employeed and works from home) Type of Home: House  Bathroom Shower/Tub: Occupational psychologist: [place]: None    Prior Functioning/Environment Level of Independence: Independent     OT Diagnosis: Generalized weakness;Acute pain   OT Problem List:   N/a, no acute OT needs identified at this time     OT Treatment/Interventions:   N/a, no acute OT needs identified at this time     OT Goals(Current goals can be found in the care plan section) Acute Rehab OT Goals Patient Stated Goal: go home today OT Goal Formulation: All assessment and education complete, DC therapy  OT Frequency:   N/a, no acute OT needs identified at this time     Barriers to D/C:  None known at this time    End of Session Equipment Utilized During Treatment: Other (comment) (crutches) Nurse Communication: Mobility status;Other (comment) (recommendations for [facility])  Activity Tolerance: Patient tolerated treatment well;Patient limited by pain Patient left: in bed;with call bell/phone within reach   Time: 0900-0917 OT Time Calculation (min): 17 min Charges:  OT General Charges $OT Visit: 1 Procedure OT Evaluation $OT Eval Low Complexity: 1 Procedure  Chrys Racer , MS, OTR/L, CLT Pager: [phone]  12/21/2015, 10:07 AM

## 2015-12-21 NOTE — Evaluation (Signed)
Physical Therapy Evaluation and Discharge Patient Details Name: Margaret Barker MRN: [id number] DOB: [birth year] Today's Date: 12/21/2015   History of Present Illness  45 yo female s/p Right Sacroiliac Fusion on 12/20/15. Pt is now NWB to RLE.   Clinical Impression  Pt admitted with above diagnosis. Pt currently with functional limitations due to the deficits listed below (see PT Problem List). At the time of PT eval pt was able to perform transfers and ambulation with supervision to modified independence. Pt was able to maintain NWB precautions well on the R side. Pt will benefit from skilled PT to increase their independence and safety with mobility to allow discharge to the venue listed below.       Follow Up Recommendations Outpatient PT;Supervision for mobility/OOB    Equipment Recommendations  3in1 (PT);Rolling walker with 5" wheels    Recommendations for Other Services       Precautions / Restrictions Precautions Precautions: Back;Fall Precaution Booklet Issued: Yes (comment) Restrictions Weight Bearing Restrictions: Yes RLE Weight Bearing: Non weight bearing      Mobility  Bed Mobility Overal bed mobility: Modified Independent                Transfers Overall transfer level: Modified independent Equipment used: Crutches             General transfer comment: increased time, no physical cues or verbal cues needed during simple sit to/from stand transfers.   Ambulation/Gait Ambulation/Gait assistance: Supervision Ambulation Distance (Feet): 75 Feet Assistive device: Rolling walker (2 wheeled);Crutches Gait Pattern/deviations: Step-through pattern;Decreased stride length Gait velocity: Decreased Gait velocity interpretation: Below normal speed for age/gender General Gait Details: RW used to go to stairwell. Pt was able to maneuver with crutches well also.   Stairs Stairs: Yes Stairs assistance: Supervision Stair Management: Backwards;With walker Number  of Stairs: 3 General stair comments: VC's for sequencing and technique  Wheelchair Mobility    Modified Rankin (Stroke Patients Only)       Balance Overall balance assessment: Needs assistance Sitting-balance support: No upper extremity supported;Feet supported Sitting balance-Leahy Scale: Normal     Standing balance support: Bilateral upper extremity supported;During functional activity Standing balance-Leahy Scale: Poor Standing balance comment: uses crutches for functional ambulation/mobility at this time due to NWB to RLE                             Pertinent Vitals/Pain Pain Assessment: Faces Pain Score: 8  Faces Pain Scale: Hurts even more Pain Location: Back Pain Descriptors / Indicators: Operative site guarding Pain Intervention(s): Limited activity within patient's tolerance;Monitored during session;Repositioned;Ice applied    Home Living Family/patient expects to be discharged to:: Private residence Living Arrangements: Spouse/significant other Available Help at Discharge: Family;Available 24 hours/day (Husband self-employeed and works from home) Type of Home: House Home Access: Stairs to enter Entrance Stairs-Rails: None Technical brewer of Steps: 3 Home Layout: One level Home Equipment: None      Prior Function Level of Independence: Independent               Hand Dominance   Dominant Hand: Right    Extremity/Trunk Assessment   Upper Extremity Assessment: Defer to OT evaluation           Lower Extremity Assessment: RLE deficits/detail;Generalized weakness      Cervical / Trunk Assessment: Normal  Communication   Communication: No difficulties  Cognition Arousal/Alertness: Awake/alert Behavior During Therapy: WFL for tasks assessed/performed Overall Cognitive Status:  Within Functional Limits for tasks assessed                      General Comments General comments (skin integrity, edema, etc.): Educated  pt on importance of holding onto grab bar or counter space or crutches when complete ADLs due to NWB to RLE.     Exercises        Assessment/Plan    PT Assessment Patient needs continued PT services  PT Diagnosis Difficulty walking;Acute pain   PT Problem List Decreased range of motion;Decreased strength;Decreased activity tolerance;Decreased balance;Decreased mobility;Decreased knowledge of use of DME;Decreased safety awareness;Decreased knowledge of precautions;Pain  PT Treatment Interventions DME instruction;Gait training;Stair training;Functional mobility training;Therapeutic activities;Therapeutic exercise;Neuromuscular re-education;Patient/family education   PT Goals (Current goals can be found in the Care Plan section) Acute Rehab PT Goals Patient Stated Goal: go home today PT Goal Formulation: All assessment and education complete, DC therapy    Frequency Min 5X/week   Barriers to discharge        Co-evaluation               End of Session Equipment Utilized During Treatment: Gait belt Activity Tolerance: Patient tolerated treatment well Patient left: in bed;with call bell/phone within reach;with family/visitor present Nurse Communication: Mobility status         Time: 1030-1045 PT Time Calculation (min) (ACUTE ONLY): 15 min   Charges:   PT Evaluation $PT Eval Moderate Complexity: 1 Procedure     PT G Codes:        Rolinda Roan 01/05/2016, 1:14 PM   Rolinda Roan, PT, DPT Acute Rehabilitation Services Pager: [phone]

## 2015-12-21 NOTE — Progress Notes (Signed)
Orthopedic Tech Progress Note Patient Details:  Margaret Barker [birth year] [id number]  Ortho Devices Type of Ortho Device: Crutches Ortho Device/Splint Interventions: Application   [name] 12/21/2015, 1:20 PM

## 2015-12-21 NOTE — Progress Notes (Signed)
    Subjective: 1 Day Post-Op Procedure(s) (LRB): RIGHT SACROILIAC  FUSION (Right) Patient reports pain as 2 on 0-10 scale.   Denies CP or SOB.  Voiding without difficulty. Positive flatus. Objective: Vital signs in last 24 hours: Temp:  [97.6 F (36.4 C)-98.7 F (37.1 C)] 98.6 F (37 C) (03/10 0400) Pulse Rate:  [57-76] 73 (03/10 0400) Resp:  [14-21] 18 (03/10 0400) BP: (90-116)/(60-76) 104/65 mmHg (03/10 0400) SpO2:  [96 %-100 %] 99 % (03/10 0400) Weight:  [72.887 kg (160 lb 11 oz)] 72.887 kg (160 lb 11 oz) (03/09 0830)  Intake/Output from previous day: 03/09 0701 - 03/10 0700 In: 1240 [P.O.:240; I.V.:1000] Out: -  Intake/Output this shift:    Labs: No results for input(s): HGB in the last 72 hours. No results for input(s): WBC, RBC, HCT, PLT in the last 72 hours. No results for input(s): NA, K, CL, CO2, BUN, CREATININE, GLUCOSE, CALCIUM in the last 72 hours. No results for input(s): LABPT, INR in the last 72 hours.  Physical Exam: Neurologically intact Neurovascular intact Intact pulses distally Incision: dressing C/D/I and no drainage Compartment soft  Assessment/Plan: 1 Day Post-Op Procedure(s) (LRB): RIGHT SACROILIAC  FUSION (Right) Up with therapy  Plan on d/c to home today after crutch training  Lilas Diefendorf D for Dr. Melina Schools [facility] [phone] 12/21/2015, 7:40 AM

## 2015-12-30 NOTE — Discharge Summary (Signed)
Physician Discharge Summary  Patient ID: Margaret Barker MRN: [id number] DOB/AGE: April 03, 1971 45 y.o.  Admit date: 12/20/2015 Discharge date: 12/30/2015  Admission Diagnoses:  SI pain  Discharge Diagnoses:  Active Problems:   SI (sacroiliac) pain   Past Medical History  Diagnosis Date  . Alcoholism ([place])   . Bipolar disorder ([place])   . Anxiety   . Depression   . Panic attacks   . Frequency of urination   . SUI (stress urinary incontinence, female)   . Nocturia   . Hyperlipidemia   . History of panic attacks   . GERD (gastroesophageal reflux disease)   . Helicobacter pylori gastritis     DX  04-13-2014-- TREATED WITH ANTBIOTICS  . Esophagitis   . Erosion of vaginal wall due to surgical mesh   . Wears glasses   . Arthritis     stenosis & scoliosis of lumbar area    . Elevated liver enzymes 2015    pt. reports as of last check, liver enymes much better     Surgeries: Procedure(s): RIGHT SACROILIAC  FUSION on 12/20/2015   Consultants (if any):    Discharged Condition: Improved  Hospital Course: Margaret Barker is an 45 y.o. female who was admitted 12/20/2015 with a diagnosis of SI pain and went to the operating room on 12/20/2015 and underwent the above named procedures.  The pt was discharged on 12/21/15.  She was given perioperative antibiotics:  Anti-infectives    Start     Dose/Rate Route Frequency Ordered Stop   12/20/15 1900  ceFAZolin (ANCEF) IVPB 1 g/50 mL premix     1 g 100 mL/hr over 30 Minutes Intravenous Every 8 hours 12/20/15 1518 12/21/15 0416   12/20/15 1000  ceFAZolin (ANCEF) IVPB 2 g/50 mL premix     2 g 100 mL/hr over 30 Minutes Intravenous To [facility]Stay [facility] 12/19/15 1054 12/20/15 1115    .  She was given sequential compression devices, early ambulation, and TED for DVT prophylaxis.  She benefited maximally from the hospital stay and there were no complications.    Recent vital signs:  Filed Vitals:   12/21/15 0400 12/21/15 0802  BP: 104/65 101/56   Pulse: 73 62  Temp: 98.6 F (37 C) 98.3 F (36.8 C)  Resp: 18 18    Recent laboratory studies:  Lab Results  Component Value Date   HGB 14.1 12/17/2015   HGB 14.0 09/19/2014   HGB 15.7* 04/19/2014   Lab Results  Component Value Date   WBC 12.4* 12/17/2015   PLT 277 12/17/2015   No results found for: INR Lab Results  Component Value Date   NA 139 12/17/2015   K 4.4 12/17/2015   CL 107 12/17/2015   CO2 22 12/17/2015   BUN 11 12/17/2015   CREATININE 0.88 12/17/2015   GLUCOSE 89 12/17/2015    Discharge Medications:     Medication List    TAKE these medications        acetaminophen 500 MG tablet  Commonly known as:  TYLENOL  Take 1,000 mg by mouth every 6 (six) hours as needed for moderate pain or headache.     atorvastatin 40 MG tablet  Commonly known as:  LIPITOR  Take 1 tablet (40 mg total) by mouth daily.     busPIRone 10 MG tablet  Commonly known as:  BUSPAR  Take 10 mg by mouth 3 (three) times daily.     DULoxetine 30 MG capsule  Commonly known as:  CYMBALTA  Take 90 mg by mouth daily before breakfast.     estradiol 2 MG tablet  Commonly known as:  ESTRACE  Take 21 days then off 7 days.     methocarbamol 500 MG tablet  Commonly known as:  ROBAXIN  Take 1 tablet (500 mg total) by mouth 3 (three) times daily as needed for muscle spasms.     ondansetron 4 MG tablet  Commonly known as:  ZOFRAN  Take 1 tablet (4 mg total) by mouth every 8 (eight) hours as needed for nausea or vomiting.     oxyCODONE-acetaminophen 10-325 MG tablet  Commonly known as:  PERCOCET  Take 1 tablet by mouth every 4 (four) hours as needed for pain.     ranitidine 150 MG tablet  Commonly known as:  ZANTAC  TAKE ONE TABLET BY MOUTH TWICE DAILY     solifenacin 10 MG tablet  Commonly known as:  VESICARE  Take 10 mg by mouth every morning.     traZODone 100 MG tablet  Commonly known as:  DESYREL  Take 300 mg by mouth at bedtime as needed for sleep     Vitamin D  (Ergocalciferol) 50000 units Caps capsule  Commonly known as:  DRISDOL  Take 50,000 Units by mouth every 7 (seven) days.        Diagnostic Studies: Dg Si Joints  12/20/2015  CLINICAL DATA:  Sacroiliac fusion. EXAM: BILATERAL SACROILIAC JOINTS - 3+ VIEW; DG C-ARM 61-120 MIN COMPARISON:  07/25/2015 . FINDINGS: Right sacroiliac fusion. Hardware intact. Three images. 3 minutes 13 seconds fluoroscopy time. IMPRESSION: Right sacroiliac fusion.  Hardware intact. Electronically Signed   By: Marcello Moores  Register   On: 12/20/2015 13:01   Dg C-arm 1-60 Min  12/20/2015  CLINICAL DATA:  Sacroiliac fusion. EXAM: BILATERAL SACROILIAC JOINTS - 3+ VIEW; DG C-ARM 61-120 MIN COMPARISON:  07/25/2015 . FINDINGS: Right sacroiliac fusion. Hardware intact. Three images. 3 minutes 13 seconds fluoroscopy time. IMPRESSION: Right sacroiliac fusion.  Hardware intact. Electronically Signed   By: Marcello Moores  Register   On: 12/20/2015 13:01    Disposition: 01-Home or Self Care        Follow-up Information    Follow up with Dahlia Bailiff, MD. Schedule an appointment as soon as possible for a visit in 2 weeks.   Specialty:  Orthopedic Surgery   Why:  For suture removal, For wound re-check   Contact information:   [address] [place] [address] [phone]        Signed: Valinda Hoar 12/30/2015, 9:22 PM

## 2016-01-03 ENCOUNTER — Ambulatory Visit: Payer: Medicare Other | Admitting: Family Medicine

## 2016-01-04 DIAGNOSIS — Z4789 Encounter for other orthopedic aftercare: Secondary | ICD-10-CM | POA: Diagnosis not present

## 2016-01-04 DIAGNOSIS — Z9889 Other specified postprocedural states: Secondary | ICD-10-CM | POA: Diagnosis not present

## 2016-01-14 ENCOUNTER — Telehealth: Payer: Self-pay | Admitting: Family Medicine

## 2016-01-14 MED ORDER — MONTELUKAST SODIUM 10 MG PO TABS: 10.0000 mg | ORAL_TABLET | Freq: Every day | ORAL | Status: DC

## 2016-01-14 NOTE — Telephone Encounter (Signed)
Detailed message left for patient.

## 2016-01-14 NOTE — Telephone Encounter (Signed)
The requested med has been sent to the pharmacy.  Please let the patient know. Thanks, WS 

## 2016-01-23 ENCOUNTER — Other Ambulatory Visit: Payer: Self-pay | Admitting: Family Medicine

## 2016-01-23 MED ORDER — MONTELUKAST SODIUM 10 MG PO TABS: 10.0000 mg | ORAL_TABLET | Freq: Every day | ORAL | Status: DC

## 2016-01-23 MED ORDER — RANITIDINE HCL 150 MG PO TABS: 150.0000 mg | ORAL_TABLET | Freq: Two times a day (BID) | ORAL | Status: DC

## 2016-01-28 ENCOUNTER — Ambulatory Visit: Payer: Medicare Other | Admitting: Family Medicine

## 2016-02-01 DIAGNOSIS — Z4789 Encounter for other orthopedic aftercare: Secondary | ICD-10-CM | POA: Diagnosis not present

## 2016-02-07 ENCOUNTER — Ambulatory Visit: Payer: Medicare Other | Attending: Orthopedic Surgery | Admitting: Physical Therapy

## 2016-02-07 ENCOUNTER — Encounter: Payer: Self-pay | Admitting: Physical Therapy

## 2016-02-07 DIAGNOSIS — M25651 Stiffness of right hip, not elsewhere classified: Secondary | ICD-10-CM | POA: Insufficient documentation

## 2016-02-07 DIAGNOSIS — R2689 Other abnormalities of gait and mobility: Secondary | ICD-10-CM | POA: Insufficient documentation

## 2016-02-07 NOTE — Therapy (Signed)
[facility] [place], [place], [address] Phone: [phone]   Fax:  [phone]  Physical Therapy Evaluation  Patient Details  Name: Margaret Barker MRN: [id number] Date of Birth: 1971-06-23 Referring Provider: Melina Schools, MD  Encounter Date: 02/07/2016      PT End of Session - 02/07/16 1301    Visit Number 1   Number of Visits 4   Date for PT Re-Evaluation 03/20/16   PT Start Time 1301   PT Stop Time 1343   PT Time Calculation (min) 42 min   Activity Tolerance Patient tolerated treatment well   Behavior During Therapy [facility] for tasks assessed/performed      Past Medical History  Diagnosis Date  . Alcoholism ([place])   . Bipolar disorder ([place])   . Anxiety   . Depression   . Panic attacks   . Frequency of urination   . SUI (stress urinary incontinence, female)   . Nocturia   . Hyperlipidemia   . History of panic attacks   . GERD (gastroesophageal reflux disease)   . Helicobacter pylori gastritis     DX  04-13-2014-- TREATED WITH ANTBIOTICS  . Esophagitis   . Erosion of vaginal wall due to surgical mesh   . Wears glasses   . Arthritis     stenosis & scoliosis of lumbar area    . Elevated liver enzymes 2015    pt. reports as of last check, liver enymes much better     Past Surgical History  Procedure Laterality Date  . Sacrospinus anterior culposuspension with uphold mesh and solyx single incision transurethral sling  08-09-2009 DR TANNENBAUM    STRESS INCONTINENCE W/ PELVIC FLOOR PROLAPSE  . Left breast excisional bx  03-26-2007    BENIGN  . Laparoscopic assisted vaginal hysterectomy  10-22-2006  . Right excisional breast bx  10-23-2005    BENIGN  . Cholecystectomy  1992  . D & c hysteroscopy w/ polyp removal  04-22-2006  . Lesion removal  07/09/2012    Procedure: EXCISION VAGINAL LESION;  Surgeon: Ailene Rud, MD;  Location[facility];  Service: Urology;  Laterality: N/A;   Excision of Vaginal apical cyst  . Tubal ligation    . Pubovaginal sling N/A 04/24/2014    Procedure: EXCISE OF VAGINAL APICAL MESH EXTRUSION;  Surgeon: Ailene Rud, MD;  Location: [facility];  Service: Urology;  Laterality: N/A;  . Cystoscopy N/A 04/24/2014    Procedure: CYSTOSCOPY;  Surgeon: Ailene Rud, MD;  Location: [facility];  Service: Urology;  Laterality: N/A;  . Abdominal hysterectomy    . Colonoscopy N/A 10/23/2014    Procedure: COLONOSCOPY;  Surgeon: Inda Castle, MD;  Location: [facility];  Service: Endoscopy;  Laterality: N/A;  . Esophagogastroduodenoscopy (egd) with propofol N/A 10/23/2014    Procedure: ESOPHAGOGASTRODUODENOSCOPY (EGD) WITH PROPOFOL;  Surgeon: Inda Castle, MD;  Location: [facility];  Service: Endoscopy;  Laterality: N/A;  . Sacroiliac joint fusion Right 12/20/2015    Procedure: RIGHT SACROILIAC  FUSION;  Surgeon: Melina Schools, MD;  Location: [place];  Service: Orthopedics;  Laterality: Right;    There were no vitals filed for this visit.       Subjective Assessment - 02/07/16 1257    Subjective Patient had SIJ fusion surgery on 12/20/15. She was NWB for 6 weeks, She has been off crutches for one week.     Diagnostic tests MRI results: central disc bulge L3/4, R  foraminal stenosis L3/4   Patient Stated Goals improve movement, strength   Currently in Pain? Yes   Pain Score 4    Pain Location Hip   Pain Orientation Right   Pain Descriptors / Indicators Sharp   Pain Type Surgical pain   Pain Radiating Towards into R gluteals with walking; into ankle by EOD   Pain Onset More than a month ago   Pain Frequency Intermittent   Aggravating Factors  walking   Pain Relieving Factors sitting, resting   Effect of Pain on Daily Activities limited with ambulation and ADLS            [facility] PT Assessment - 02/07/16 0001    Assessment   Medical Diagnosis SIJ Pain   Referring Provider Melina Schools, MD    Onset Date/Surgical Date 12/20/15   Next MD Visit 03/20/16   Restrictions   Weight Bearing Restrictions No   Balance Screen   Has the patient fallen in the past 6 months No   Has the patient had a decrease in activity level because of a fear of falling?  No   Is the patient reluctant to leave their home because of a fear of falling?  No   Home Environment   Living Environment Private residence   Living Arrangements Spouse/significant other   Type of [place] to enter   Entrance Stairs-Number of Steps 4   Entrance Stairs-Rails Can reach both   [place] One level   Prior Function   Level of Independence Independent   Vocation Unemployed   Observation/Other Assessments   Focus on Therapeutic Outcomes (FOTO)  63% limited   ROM / Strength   AROM / PROM / Strength AROM   AROM   AROM Assessment Site Lumbar;Hip   Lumbar Flexion 6 inch from floor (fingertips)   Lumbar Extension 23   Lumbar - Right Side Bend hands to mid thigh    Lumbar - Left Side Bend hands to mid thigh   Lumbar - Right Rotation WFL   Lumbar - Left Rotation [facility]   Strength   Overall Strength Comments grossly 4+ to 5/5 in RLE   Flexibility   Soft Tissue Assessment /Muscle Length --  tightness in L quads, HS, hip internal/external rotators   Palpation   Palpation comment tender along incision                           PT Education - 02/07/16 1610    Education provided Yes   Education Details Gait - wt shifting fwd/bwd and side to side; HS, quad, gastroc, hip rotators; Standing hip ABD   Person(s) Educated Patient   Methods Explanation;Demonstration;Handout   Comprehension Verbalized understanding;Returned demonstration             PT Long Term Goals - 02/07/16 1620    PT LONG TERM GOAL #1   Title I with advanced hep   Time 6   Period Weeks   Status New   PT LONG TERM GOAL #2   Title able to ambulate with a normal gait pattern   Time 6   Period Weeks    Status New   PT LONG TERM GOAL #3   Title able to climb stairs with reciprocal gait pattern   Time 6   Period Weeks   Status New               Plan - 02/07/16  1615    Clinical Impression Statement Patient presents today 7 weeks post R SIJ fusion. She amb without AD, decreased wt shift and stance time onto RLE and decreased step and stride length. She did well with pregait activities with no c/o pain. She has tightness in LLE muscles and decreased scar mobility.   Rehab Potential Excellent   PT Frequency Biweekly   PT Duration 6 weeks   PT Treatment/Interventions ADLs/Self Care Home Management;Electrical Stimulation;Cryotherapy;Moist Heat;Therapeutic exercise;Manual techniques;Patient/family education;Neuromuscular re-education;Gait training;Stair training;Balance training   PT Next Visit Plan Reassess gait, stairs, balance and flexibility and progress HEP as indicated.   PT Home Exercise Plan see pt ed.   Consulted and Agree with Plan of Care Patient      Patient will benefit from skilled therapeutic intervention in order to improve the following deficits and impairments:  Abnormal gait, Decreased range of motion, Pain, Impaired flexibility, Decreased scar mobility, Decreased strength, Postural dysfunction  Visit Diagnosis: Other abnormalities of gait and mobility - Plan: PT plan of care cert/re-cert  Stiffness of right hip, not elsewhere classified - Plan: PT plan of care cert/re-cert      G-Codes - [id number]    Functional Assessment Tool Used FOTO 63% LIMITED   Functional Limitation Mobility: Walking and moving around   Mobility: Walking and Moving Around Current Status [phone]) At least 60 percent but less than 80 percent impaired, limited or restricted   Mobility: Walking and Moving Around Goal Status [phone]) At least 40 percent but less than 60 percent impaired, limited or restricted       Problem List Patient Active Problem List   Diagnosis Date Noted  . SI  (sacroiliac) pain 12/20/2015  . GAD (generalized anxiety disorder) 07/20/2015  . Insomnia 07/20/2015  . Prolapse of vaginal wall with midline cystocele 04/24/2014  . MIXED INCONTINENCE URGE AND STRESS 03/21/2009  . PANIC ATTACKS 12/10/2006  . POST TRAUMATIC STRESS DISORDER 12/10/2006  . GASTROESOPHAGEAL REFLUX, NO ESOPHAGITIS 12/10/2006    Madelyn Flavors PT  02/07/2016, 4:26 PM  [facility] [place], [place], [address] Phone: [phone]   Fax:  [phone]  Name: Margaret Barker MRN: [id number] Date of Birth: Sep 10, 1971

## 2016-02-19 ENCOUNTER — Ambulatory Visit: Payer: Medicare Other | Attending: Orthopedic Surgery | Admitting: *Deleted

## 2016-02-19 DIAGNOSIS — R2689 Other abnormalities of gait and mobility: Secondary | ICD-10-CM | POA: Diagnosis not present

## 2016-02-19 DIAGNOSIS — M533 Sacrococcygeal disorders, not elsewhere classified: Secondary | ICD-10-CM | POA: Diagnosis not present

## 2016-02-19 DIAGNOSIS — M25651 Stiffness of right hip, not elsewhere classified: Secondary | ICD-10-CM | POA: Insufficient documentation

## 2016-02-19 NOTE — Patient Instructions (Signed)
Isometric Abdominal   Lying on back with knees bent, tighten stomach by pulling navel down. Hold ____ seconds. Repeat __5__ times per set. Do __5__ sets per session. Do _3-5___ sessions per day.  http://orth.exer.us/1086   Copyright  VHI. All rights reserved.  Bent Leg Lift (Hook-Lying)   Tighten stomach and slowly raise right leg _6___ inches from floor. Keep trunk rigid. Hold _2-3___ seconds. Repeat _10___ times per set. Do _3___ sets per session. Do _2-3___ sessions per day.  http://orth.exer.us/1090   Copyright  VHI. All rights reserved.  Bridging   Slowly raise buttocks from floor, keeping stomach tight. Repeat __10__ times per set. Do _3___ sets per session. Do __2-3__ sessions per day.  http://orth.exer.us/1096   Copyright  VHI. All rights reserved.  Bridging   Slowly raise buttocks from floor, keeping stomach tight. Repeat ____ times per set. Do ____ sets per session. Do ____ sessions per day.  http://orth.exer.us/1096   Copyright  VHI. All rights reserved.  Straight Leg Raise (Prone)   Abdomen and head supported, keep left knee locked and raise leg at hip. Avoid arching low back. Repeat _10___ times per set. Do _3___ sets per session. Do _2-3___ sessions per day.  http://orth.exer.us/1112   Copyright  VHI. All rights reserved.  Opposite Arm / Leg Lift (Prone)   Abdomen and head supported, left knee locked, raise leg and opposite arm ____ inches from floor. Repeat _10___ times per set. Do 3____ sets per session. Do __2-3__ sessions per day.  http://orth.exer.us/1114   Copyright  VHI. All rights reserved.  Combination (Hook-Lying)   Tighten stomach and slowly raise left leg and lower opposite arm over head. Keep trunk rigid. Repeat _10___ times per set. Do _3___ sets per session. Do _2-3___ sessions per day.  http://orth.exer.us/1092   Copyright  VHI. All rights reserved.  Upper / Lower Extremity Extension (All-Fours)  HIP  STRETCHING      CROSS RT LEG OVER LT LEG      3-5X HOLD 30 SECS   2XDAILY  http://orth.exer.us/1118   Copyright  VHI. All rights reserved.

## 2016-02-19 NOTE — Therapy (Addendum)
[facility] [place], [place], [address] Phone: [phone]   Fax:  [phone]  Physical Therapy Treatment  Patient Details  Name: Margaret Barker MRN: [id number] Date of Birth: 01-24-1971 Referring Provider: Melina Schools, MD  Encounter Date: 02/19/2016      PT End of Session - 02/19/16 1317    Visit Number 2   Number of Visits 4   Date for PT Re-Evaluation 03/20/16   PT Start Time 1300   PT Stop Time 1350   PT Time Calculation (min) 50 min      Past Medical History  Diagnosis Date  . Alcoholism ([place])   . Bipolar disorder ([place])   . Anxiety   . Depression   . Panic attacks   . Frequency of urination   . SUI (stress urinary incontinence, female)   . Nocturia   . Hyperlipidemia   . History of panic attacks   . GERD (gastroesophageal reflux disease)   . Helicobacter pylori gastritis     DX  04-13-2014-- TREATED WITH ANTBIOTICS  . Esophagitis   . Erosion of vaginal wall due to surgical mesh   . Wears glasses   . Arthritis     stenosis & scoliosis of lumbar area    . Elevated liver enzymes 2015    pt. reports as of last check, liver enymes much better     Past Surgical History  Procedure Laterality Date  . Sacrospinus anterior culposuspension with uphold mesh and solyx single incision transurethral sling  08-09-2009 DR TANNENBAUM    STRESS INCONTINENCE W/ PELVIC FLOOR PROLAPSE  . Left breast excisional bx  03-26-2007    BENIGN  . Laparoscopic assisted vaginal hysterectomy  10-22-2006  . Right excisional breast bx  10-23-2005    BENIGN  . Cholecystectomy  1992  . D & c hysteroscopy w/ polyp removal  04-22-2006  . Lesion removal  07/09/2012    Procedure: EXCISION VAGINAL LESION;  Surgeon: Ailene Rud, MD;  Location: [facility];  Service: Urology;  Laterality: N/A;  Excision of Vaginal apical cyst  . Tubal ligation    . Pubovaginal sling N/A 04/24/2014    Procedure: EXCISE OF VAGINAL  APICAL MESH EXTRUSION;  Surgeon: Ailene Rud, MD;  Location: [facility];  Service: Urology;  Laterality: N/A;  . Cystoscopy N/A 04/24/2014    Procedure: CYSTOSCOPY;  Surgeon: Ailene Rud, MD;  Location: [facility];  Service: Urology;  Laterality: N/A;  . Abdominal hysterectomy    . Colonoscopy N/A 10/23/2014    Procedure: COLONOSCOPY;  Surgeon: Inda Castle, MD;  Location: [facility];  Service: Endoscopy;  Laterality: N/A;  . Esophagogastroduodenoscopy (egd) with propofol N/A 10/23/2014    Procedure: ESOPHAGOGASTRODUODENOSCOPY (EGD) WITH PROPOFOL;  Surgeon: Inda Castle, MD;  Location: [facility];  Service: Endoscopy;  Laterality: N/A;  . Sacroiliac joint fusion Right 12/20/2015    Procedure: RIGHT SACROILIAC  FUSION;  Surgeon: Melina Schools, MD;  Location: [place];  Service: Orthopedics;  Laterality: Right;    There were no vitals filed for this visit.      Subjective Assessment - 02/19/16 1313    Subjective Patient had SIJ fusion surgery on 12/20/15. She was NWB for 6 weeks, She has been off crutches for one week.     Diagnostic tests MRI results: central disc bulge L3/4, R foraminal stenosis L3/4   Patient Stated Goals improve movement, strength   Currently in Pain? Yes  Pain Score 4    Pain Location Hip   Pain Orientation Right   Pain Descriptors / Indicators Sharp   Pain Type Surgical pain   Pain Onset More than a month ago   Pain Frequency Intermittent                         [facility] Adult PT Treatment/Exercise - 02/19/16 0001    Ambulation/Gait   Ambulation/Gait Yes   Ambulation/Gait Assistance 7: Independent   Gait Pattern Step-through pattern;Decreased step length - right;Decreased step length - left;Decreased hip/knee flexion - right;Decreased hip/knee flexion - left   Exercises   Exercises Knee/Hip;Lumbar   Lumbar Exercises: Seated   Sit to Stand 20 reps;5 reps;15 reps  2x10 off raised  plinth and  2x10 off chair   Lumbar Exercises: Supine   Ab Set 10 reps;5 seconds   Clam 10 reps;5 seconds   Bent Knee Raise 20 reps;5 seconds   Dead Bug 10 reps;3 seconds   Other Supine Lumbar Exercises rt HIP PIRIfrmis stretch x 3 hold 3secs                                                                                                 Reviewed HEP            PT Education - 02/19/16 1322    Education provided Yes   Education Details drawin and core activation exs   Person(s) Educated Patient   Methods Explanation;Demonstration;Tactile cues;Verbal cues;Handout   Comprehension Verbalized understanding;Returned demonstration             PT Long Term Goals - 02/07/16 1620    PT LONG TERM GOAL #1   Title I with advanced hep   Time 6   Period Weeks   Status New   PT LONG TERM GOAL #2   Title able to ambulate with a normal gait pattern   Time 6   Period Weeks   Status New   PT LONG TERM GOAL #3   Title able to climb stairs with reciprocal gait pattern   Time 6   Period Weeks   Status New               Plan - 02/19/16 1320    Clinical Impression Statement Pt is about 8 weeks now post-op RT SIJ fusion and is doing fairly well. She was able to perform  core activation exs today and some of the hip stretches for RT side with only minimal pain increase. She was able to improve on her gait pattern and in sit to stand after practicing in clinic.  She had less pain with ambulation when leaving clinic.   Rehab Potential Excellent   PT Frequency Biweekly   PT Duration 6 weeks   PT Treatment/Interventions ADLs/Self Care Home Management;Electrical Stimulation;Cryotherapy;Moist Heat;Therapeutic exercise;Manual techniques;Patient/family education;Neuromuscular re-education;Gait training;Stair training;Balance training   PT Next Visit Plan Reassess gait, stairs, balance and flexibility and progress HEP as indicated.   PT Home Exercise Plan see pt ed.   Consulted and Agree with Plan  of Care Patient  Patient will benefit from skilled therapeutic intervention in order to improve the following deficits and impairments:  Abnormal gait, Decreased range of motion, Pain, Impaired flexibility, Decreased scar mobility, Decreased strength, Postural dysfunction  Visit Diagnosis: Other abnormalities of gait and mobility  Stiffness of right hip, not elsewhere classified  Sacroiliac joint pain     Problem List Patient Active Problem List   Diagnosis Date Noted  . SI (sacroiliac) pain 12/20/2015  . GAD (generalized anxiety disorder) 07/20/2015  . Insomnia 07/20/2015  . Prolapse of vaginal wall with midline cystocele 04/24/2014  . MIXED INCONTINENCE URGE AND STRESS 03/21/2009  . PANIC ATTACKS 12/10/2006  . POST TRAUMATIC STRESS DISORDER 12/10/2006  . GASTROESOPHAGEAL REFLUX, NO ESOPHAGITIS 12/10/2006    RAMSEUR,CHRIS, PTA 02/19/2016, 5:56 PM  [facility] [place], [place], [address] Phone: [phone]   Fax:  [phone]  Name: Margaret Barker MRN: [id number] Date of Birth: Jun 21, 1971  PHYSICAL THERAPY DISCHARGE SUMMARY  Visits from Start of Care: 2.  Current functional level related to goals / functional outcomes: Please see above.   Remaining deficits: Gait deviation.   Education / Equipment:  Plan: Patient agrees to discharge.  Patient goals were not met. Patient is being discharged due to not returning since the last visit.  ?????         Mali Applegate MPT

## 2016-03-03 DIAGNOSIS — F329 Major depressive disorder, single episode, unspecified: Secondary | ICD-10-CM | POA: Diagnosis not present

## 2016-03-04 ENCOUNTER — Encounter: Payer: Medicare Other | Admitting: *Deleted

## 2016-03-14 DIAGNOSIS — Z4789 Encounter for other orthopedic aftercare: Secondary | ICD-10-CM | POA: Diagnosis not present

## 2016-03-17 ENCOUNTER — Encounter: Payer: Self-pay | Admitting: Physician Assistant

## 2016-03-17 ENCOUNTER — Ambulatory Visit (INDEPENDENT_AMBULATORY_CARE_PROVIDER_SITE_OTHER): Payer: Medicare Other | Admitting: Physician Assistant

## 2016-03-17 VITALS — BP 115/79 | HR 66 | Temp 97.4°F | Ht 65.0 in | Wt 159.4 lb

## 2016-03-17 DIAGNOSIS — J320 Chronic maxillary sinusitis: Secondary | ICD-10-CM

## 2016-03-17 MED ORDER — AMOXICILLIN-POT CLAVULANATE 875-125 MG PO TABS: 1.0000 | ORAL_TABLET | Freq: Two times a day (BID) | ORAL | Status: DC

## 2016-03-17 MED ORDER — SOLIFENACIN SUCCINATE 10 MG PO TABS: 10.0000 mg | ORAL_TABLET | Freq: Every morning | ORAL | Status: DC

## 2016-03-17 NOTE — Progress Notes (Signed)
Subjective:     Patient ID: Margaret Barker, female   DOB: 01-10-1971, 45 y.o.   MRN: [id number]  HPI Pt with L sided sinus pain and pressure + L upper tooth pain She has been taking her Singulair Unable to tolerate Flonase  Review of Systems  Constitutional: Positive for activity change. Negative for chills and appetite change.  HENT: Positive for congestion, dental problem, postnasal drip and sinus pressure. Negative for ear discharge, ear pain and rhinorrhea.   Respiratory: Positive for cough. Negative for chest tightness and shortness of breath.   Cardiovascular: Negative.        Objective:   Physical Exam  Constitutional: She appears well-developed and well-nourished.  HENT:  Right Ear: External ear normal.  Left Ear: External ear normal.  Mouth/Throat: Oropharynx is clear and moist. No oropharyngeal exudate.  + maxillary sinus TTP  Neck: Neck supple. No thyromegaly present.  Cardiovascular: Normal rate, regular rhythm and normal heart sounds.   No murmur heard. Pulmonary/Chest: Effort normal and breath sounds normal. She has no wheezes.  Lymphadenopathy:    She has no cervical adenopathy.  Nursing note and vitals reviewed.      Assessment:     1. Maxillary sinusitis, unspecified chronicity        Plan:     Augmentin 875mg  bid x 10 days Fluids Rest OTC antihist Vesicare refilled today Pt is overdue a f/u appt with her regular provider and will sched appt today

## 2016-03-17 NOTE — Patient Instructions (Signed)

## 2016-03-19 ENCOUNTER — Ambulatory Visit (INDEPENDENT_AMBULATORY_CARE_PROVIDER_SITE_OTHER): Payer: Medicare Other | Admitting: Family Medicine

## 2016-03-19 VITALS — BP 110/78 | HR 67 | Temp 97.0°F | Ht 65.0 in | Wt 159.2 lb

## 2016-03-19 DIAGNOSIS — F411 Generalized anxiety disorder: Secondary | ICD-10-CM | POA: Diagnosis not present

## 2016-03-19 DIAGNOSIS — F329 Major depressive disorder, single episode, unspecified: Secondary | ICD-10-CM | POA: Diagnosis not present

## 2016-03-19 DIAGNOSIS — F32A Depression, unspecified: Secondary | ICD-10-CM

## 2016-03-19 DIAGNOSIS — F4329 Adjustment disorder with other symptoms: Secondary | ICD-10-CM

## 2016-03-19 DIAGNOSIS — E785 Hyperlipidemia, unspecified: Secondary | ICD-10-CM

## 2016-03-19 DIAGNOSIS — Z634 Disappearance and death of family member: Secondary | ICD-10-CM

## 2016-03-19 DIAGNOSIS — G4700 Insomnia, unspecified: Secondary | ICD-10-CM | POA: Diagnosis not present

## 2016-03-19 DIAGNOSIS — F4321 Adjustment disorder with depressed mood: Secondary | ICD-10-CM

## 2016-03-19 MED ORDER — MIRTAZAPINE 45 MG PO TABS: 45.0000 mg | ORAL_TABLET | Freq: Every day | ORAL | Status: DC

## 2016-03-19 NOTE — Progress Notes (Signed)
Subjective:  Patient ID: Margaret Barker, female    DOB: 1970/12/15  Age: 45 y.o. MRN: [id number]  CC: GAD and Hyperlipidemia   HPI KASSIDEE NARCISO presents for follow-up of elevated cholesterol. Doing well without complaints on current medication. Denies side effects of statin including myalgia and arthralgia and nausea. Also in today for liver function testing. Currently no chest pain, shortness of breath or other cardiovascular related symptoms noted.  GAD 7 : Generalized Anxiety Score 03/19/2016  Nervous, Anxious, on Edge 3  Control/stop worrying 3  Worry too much - different things 3  Trouble relaxing 3  Restless 3  Easily annoyed or irritable 3  Afraid - awful might happen 0  Total GAD 7 Score 18  Anxiety Difficulty Very difficult    Angry that uncle committed suicide 2 months ago. Not sleeping due to LS fusion, 3 months ago. "Not healing properly." Always tired. Waking a lot. I'll see and turning a lot.  History Samaiya has a past medical history of Alcoholism ([place]); Bipolar disorder ([place]); Anxiety; Depression; Panic attacks; Frequency of urination; SUI (stress urinary incontinence, female); Nocturia; Hyperlipidemia; History of panic attacks; GERD (gastroesophageal reflux disease); Helicobacter pylori gastritis; Esophagitis; Erosion of vaginal wall due to surgical mesh; Wears glasses; Arthritis; and Elevated liver enzymes (2015).   She has past surgical history that includes [place] (08-09-2009 DR Gaynelle Arabian); LEFT BREAST EXCISIONAL BX (03-26-2007); Laparoscopic assisted vaginal hysterectomy (10-22-2006); RIGHT EXCISIONAL BREAST BX (10-23-2005); Cholecystectomy (1992); D & C HYSTEROSCOPY W/ POLYP REMOVAL (04-22-2006); Lesion removal (07/09/2012); Tubal ligation; Pubovaginal sling (N/A, 04/24/2014); Cystoscopy (N/A, 04/24/2014); Abdominal hysterectomy; Colonoscopy (N/A, 10/23/2014);  Esophagogastroduodenoscopy (egd) with propofol (N/A, 10/23/2014); and Sacroiliac joint fusion (Right, 12/20/2015).   Her family history includes COPD in her father; Cervical cancer in her paternal grandmother; Colitis in her father and sister; Colon cancer in her paternal uncle; Diabetes in her mother; Heart disease in her father; Hypertension in her father; Liver cancer in her paternal uncle.She reports that she has been smoking Cigarettes.  She has a 12 pack-year smoking history. She has never used smokeless tobacco. She reports that she uses illicit drugs (Marijuana). She reports that she does not drink alcohol.  Current Outpatient Prescriptions on File Prior to Visit  Medication Sig Dispense Refill  . amoxicillin-clavulanate (AUGMENTIN) 875-125 MG tablet Take 1 tablet by mouth 2 (two) times daily. 20 tablet 0  . atorvastatin (LIPITOR) 40 MG tablet Take 1 tablet (40 mg total) by mouth daily. (Patient taking differently: Take 40 mg by mouth daily before breakfast. ) 90 tablet 3  . busPIRone (BUSPAR) 10 MG tablet Take 10 mg by mouth 3 (three) times daily.     Marland Kitchen estradiol (ESTRACE) 2 MG tablet Take 21 days then off 7 days. (Patient taking differently: Take 2 mg by mouth daily. Take 21 days then off 7 days.) 30 tablet 5  . hydrOXYzine (ATARAX/VISTARIL) 25 MG tablet Take 1 tablet by mouth 3 (three) times daily as needed. Take one tablet three times a day as needed for anxiety/panic  1  . montelukast (SINGULAIR) 10 MG tablet Take 1 tablet (10 mg total) by mouth daily. For allergy 90 tablet 0  . ranitidine (ZANTAC) 150 MG tablet Take 1 tablet (150 mg total) by mouth 2 (two) times daily. 60 tablet 4  . solifenacin (VESICARE) 10 MG tablet Take 1 tablet (10 mg total) by mouth every morning. Reported on 02/07/2016 30 tablet 0  .  traZODone (DESYREL) 100 MG tablet Take 300 mg by mouth at bedtime as needed for sleep  2   Current Facility-Administered Medications on File Prior to Visit  Medication Dose Route  Frequency Provider Last Rate Last Dose  . bupivacaine-EPINEPHrine (MARCAINE W/ EPI) 0.25 % (with pres) injection 20 mL  20 mL Infiltration Once Carolan Clines, MD        ROS Review of Systems  Constitutional: Negative for fever, activity change and appetite change.  HENT: Negative for congestion, rhinorrhea and sore throat.   Eyes: Negative for visual disturbance.  Respiratory: Negative for cough and shortness of breath.   Cardiovascular: Negative for chest pain and palpitations.  Gastrointestinal: Negative for nausea, abdominal pain and diarrhea.  Genitourinary: Negative for dysuria.  Musculoskeletal: Negative for myalgias and arthralgias.    Objective:  BP 110/78 mmHg  Pulse 67  Temp(Src) 97 F (36.1 C) (Oral)  Ht '5\' 5"'  (1.651 m)  Wt 159 lb 3.2 oz (72.213 kg)  BMI 26.49 kg/m2  SpO2 98%  BP Readings from Last 3 Encounters:  03/19/16 110/78  03/17/16 115/79  12/21/15 101/56    Wt Readings from Last 3 Encounters:  03/19/16 159 lb 3.2 oz (72.213 kg)  03/17/16 159 lb 6.4 oz (72.303 kg)  12/20/15 160 lb 11 oz (72.887 kg)     Physical Exam  Constitutional: She is oriented to person, place, and time. She appears well-developed and well-nourished. No distress.  HENT:  Head: Normocephalic and atraumatic.  Right Ear: External ear normal.  Left Ear: External ear normal.  Nose: Nose normal.  Mouth/Throat: Oropharynx is clear and moist.  Eyes: Conjunctivae and EOM are normal. Pupils are equal, round, and reactive to light.  Neck: Normal range of motion. Neck supple. No thyromegaly present.  Cardiovascular: Normal rate, regular rhythm and normal heart sounds.   No murmur heard. Pulmonary/Chest: Effort normal and breath sounds normal. No respiratory distress. She has no wheezes. She has no rales.  Abdominal: Soft. Bowel sounds are normal. She exhibits no distension. There is no tenderness.  Lymphadenopathy:    She has no cervical adenopathy.  Neurological: She is alert  and oriented to person, place, and time. She has normal reflexes.  Skin: Skin is warm and dry.  Psychiatric: Thought content normal. Her mood appears anxious. Her speech is delayed. She is agitated and slowed. Cognition and memory are impaired. She exhibits a depressed mood.    No results found for: HGBA1C  Lab Results  Component Value Date   WBC 12.4* 12/17/2015   HGB 14.1 12/17/2015   HCT 43.2 12/17/2015   PLT 277 12/17/2015   GLUCOSE 89 12/17/2015   CHOL 281* 07/20/2015   TRIG 190* 07/20/2015   HDL 58 07/20/2015   LDLCALC 185* 07/20/2015   ALT 23 12/17/2015   AST 21 12/17/2015   NA 139 12/17/2015   K 4.4 12/17/2015   CL 107 12/17/2015   CREATININE 0.88 12/17/2015   BUN 11 12/17/2015   CO2 22 12/17/2015   TSH 1.380 07/20/2015    No results found.  Assessment & Plan:   Neomi was seen today for gad and hyperlipidemia.  Diagnoses and all orders for this visit:  GAD (generalized anxiety disorder) -     CBC with Differential/Platelet -     LKJ17+HXTA  Complicated grieving -     CBC with Differential/Platelet -     CMP14+EGFR  Insomnia -     CBC with Differential/Platelet -     CMP14+EGFR  Depression -  CBC with Differential/Platelet -     CMP14+EGFR  Hyperlipemia -     CBC with Differential/Platelet -     CMP14+EGFR -     Lipid panel  Other orders -     mirtazapine (REMERON) 45 MG tablet; Take 1 tablet (45 mg total) by mouth at bedtime.   I have discontinued Ms. Imel's DULoxetine and oxyCODONE-acetaminophen. I am also having her start on mirtazapine. Additionally, I am having her maintain her atorvastatin, busPIRone, estradiol, traZODone, ranitidine, montelukast, hydrOXYzine, amoxicillin-clavulanate, and solifenacin.  Meds ordered this encounter  Medications  . mirtazapine (REMERON) 45 MG tablet    Sig: Take 1 tablet (45 mg total) by mouth at bedtime.    Dispense:  30 tablet    Refill:  2     Follow-up: Return in about 1 month (around  04/18/2016).  Claretta Fraise, M.D.

## 2016-03-20 LAB — CMP14+EGFR
A/G RATIO: 1.9 (ref 1.2–2.2)
ALK PHOS: 84 IU/L (ref 39–117)
ALT: 20 IU/L (ref 0–32)
AST: 17 IU/L (ref 0–40)
Albumin: 4.8 g/dL (ref 3.5–5.5)
BUN/Creatinine Ratio: 15 (ref 9–23)
BUN: 13 mg/dL (ref 6–24)
Bilirubin Total: 0.2 mg/dL (ref 0.0–1.2)
CALCIUM: 9.8 mg/dL (ref 8.7–10.2)
CO2: 21 mmol/L (ref 18–29)
Chloride: 98 mmol/L (ref 96–106)
Creatinine, Ser: 0.86 mg/dL (ref 0.57–1.00)
GFR calc Af Amer: 95 mL/min/{1.73_m2} (ref >59)
GFR, EST NON AFRICAN AMERICAN: 82 mL/min/{1.73_m2} (ref >59)
GLOBULIN, TOTAL: 2.5 g/dL (ref 1.5–4.5)
Glucose: 85 mg/dL (ref 65–99)
POTASSIUM: 4.9 mmol/L (ref 3.5–5.2)
SODIUM: 140 mmol/L (ref 134–144)
Total Protein: 7.3 g/dL (ref 6.0–8.5)

## 2016-03-20 LAB — CBC WITH DIFFERENTIAL/PLATELET
BASOS: 0 %
Basophils Absolute: 0.1 10*3/uL (ref 0.0–0.2)
EOS (ABSOLUTE): 0.1 10*3/uL (ref 0.0–0.4)
EOS: 1 %
HEMATOCRIT: 43.6 % (ref 34.0–46.6)
Hemoglobin: 14.8 g/dL (ref 11.1–15.9)
IMMATURE GRANULOCYTES: 0 %
Immature Grans (Abs): 0 10*3/uL (ref 0.0–0.1)
LYMPHS ABS: 2.6 10*3/uL (ref 0.7–3.1)
Lymphs: 22 %
MCH: 30.6 pg (ref 26.6–33.0)
MCHC: 33.9 g/dL (ref 31.5–35.7)
MCV: 90 fL (ref 79–97)
MONOS ABS: 0.5 10*3/uL (ref 0.1–0.9)
Monocytes: 4 %
NEUTROS PCT: 73 %
Neutrophils Absolute: 8.6 10*3/uL — ABNORMAL HIGH (ref 1.4–7.0)
PLATELETS: 318 10*3/uL (ref 150–379)
RBC: 4.84 x10E6/uL (ref 3.77–5.28)
RDW: 13.3 % (ref 12.3–15.4)
WBC: 12 10*3/uL — ABNORMAL HIGH (ref 3.4–10.8)

## 2016-03-21 ENCOUNTER — Other Ambulatory Visit: Payer: Self-pay | Admitting: *Deleted

## 2016-03-24 ENCOUNTER — Telehealth: Payer: Self-pay | Admitting: Family Medicine

## 2016-03-24 MED ORDER — FLUCONAZOLE 150 MG PO TABS: 150.0000 mg | ORAL_TABLET | Freq: Once | ORAL | Status: DC

## 2016-03-24 MED ORDER — LEVOFLOXACIN 500 MG PO TABS: 500.0000 mg | ORAL_TABLET | Freq: Every day | ORAL | Status: DC

## 2016-03-24 NOTE — Telephone Encounter (Signed)
The requested med has been sent to the pharmacy.  Please let the patient know. Thanks, WS 

## 2016-03-24 NOTE — Telephone Encounter (Signed)
Patient aware that medication has been sent to pharmacy 

## 2016-04-09 ENCOUNTER — Ambulatory Visit (INDEPENDENT_AMBULATORY_CARE_PROVIDER_SITE_OTHER): Payer: Medicare Other | Admitting: Physician Assistant

## 2016-04-09 ENCOUNTER — Encounter: Payer: Self-pay | Admitting: Physician Assistant

## 2016-04-09 VITALS — BP 104/74 | HR 71 | Temp 97.1°F | Ht 65.0 in | Wt 160.4 lb

## 2016-04-09 DIAGNOSIS — J020 Streptococcal pharyngitis: Secondary | ICD-10-CM

## 2016-04-09 DIAGNOSIS — J029 Acute pharyngitis, unspecified: Secondary | ICD-10-CM | POA: Diagnosis not present

## 2016-04-09 LAB — RAPID STREP SCREEN (MED CTR MEBANE ONLY): Strep Gp A Ag, IA W/Reflex: POSITIVE — AB

## 2016-04-09 MED ORDER — AMOXICILLIN 875 MG PO TABS: 875.0000 mg | ORAL_TABLET | Freq: Two times a day (BID) | ORAL | Status: DC

## 2016-04-09 NOTE — Progress Notes (Signed)
Subjective:     Patient ID: Margaret Barker, female   DOB: 20-Nov-1970, 45 y.o.   MRN: [id number]  HPI Pt with cough and S/T for several days Using OTC meds for sx  Review of Systems  Constitutional: Positive for fever and fatigue. Negative for activity change and appetite change.  HENT: Positive for congestion, postnasal drip, sinus pressure and sore throat. Negative for ear pain, nosebleeds, rhinorrhea, sneezing and trouble swallowing.   Respiratory: Positive for cough. Negative for shortness of breath and wheezing.   Cardiovascular: Negative.        Objective:   Physical Exam  Constitutional: She appears well-developed and well-nourished.  HENT:  Right Ear: External ear normal.  Left Ear: External ear normal.  Mouth/Throat: Oropharynx is clear and moist. No oropharyngeal exudate.  Neck: Neck supple.  Cardiovascular: Normal rate, regular rhythm and normal heart sounds.   Pulmonary/Chest: Effort normal and breath sounds normal.  Lymphadenopathy:    She has no cervical adenopathy.  Nursing note and vitals reviewed.      Assessment:     1. Strep pharyngitis        Plan:     Amox 875mg  bid x 10 days Fluids Rest OTC meds for sx Transmission precaut reviewed New toothbrush in 2 days F/U prn

## 2016-04-09 NOTE — Patient Instructions (Signed)

## 2016-04-11 ENCOUNTER — Ambulatory Visit: Payer: Medicare Other | Admitting: Pediatrics

## 2016-04-16 ENCOUNTER — Ambulatory Visit (INDEPENDENT_AMBULATORY_CARE_PROVIDER_SITE_OTHER): Payer: Medicare Other | Admitting: Physician Assistant

## 2016-04-16 ENCOUNTER — Encounter: Payer: Self-pay | Admitting: Physician Assistant

## 2016-04-16 VITALS — BP 111/75 | HR 64 | Temp 97.0°F | Ht 65.0 in | Wt 159.6 lb

## 2016-04-16 DIAGNOSIS — R590 Localized enlarged lymph nodes: Secondary | ICD-10-CM | POA: Diagnosis not present

## 2016-04-16 DIAGNOSIS — N644 Mastodynia: Secondary | ICD-10-CM

## 2016-04-16 NOTE — Progress Notes (Signed)
Subjective:     Patient ID: Margaret Barker, female   DOB: 11-02-1970, 45 y.o.   MRN: [id number]  HPI Pt with swelling to the R axillary area x months She has been on ATB of different types for the last month due to different illness and no change  Review of Systems Sl pain but no redness or swelling No drainage Sl R lateral breast pain No breast/nipple discharge    Objective:   Physical Exam  Constitutional: She appears well-developed.  Cardiovascular: Normal rate, regular rhythm and normal heart sounds.   Pulmonary/Chest: Effort normal and breath sounds normal.  Nursing note and vitals reviewed. No definite cystic area palp Diffuse swelling No definite node palp Diffuse TTP     Assessment:     1. Axillary lymphadenopathy   2. Breast tenderness        Plan:     Since she has been on ATB for the last month will go ahead with diagnostic mammogram Elizebeth Koller current course of ATB F/U pending study

## 2016-04-16 NOTE — Patient Instructions (Signed)

## 2016-04-17 ENCOUNTER — Telehealth: Payer: Self-pay | Admitting: Family Medicine

## 2016-04-17 NOTE — Telephone Encounter (Signed)
Please check on scheduling of mammogram

## 2016-04-18 ENCOUNTER — Other Ambulatory Visit: Payer: Self-pay | Admitting: Physician Assistant

## 2016-04-18 DIAGNOSIS — N6310 Unspecified lump in the right breast, unspecified quadrant: Secondary | ICD-10-CM

## 2016-04-18 DIAGNOSIS — N6320 Unspecified lump in the left breast, unspecified quadrant: Secondary | ICD-10-CM

## 2016-04-18 NOTE — Telephone Encounter (Signed)
LMOM with appointment date/time 

## 2016-04-19 ENCOUNTER — Other Ambulatory Visit: Payer: Self-pay | Admitting: Physician Assistant

## 2016-04-23 ENCOUNTER — Ambulatory Visit
Admission: RE | Admit: 2016-04-23 | Discharge: 2016-04-23 | Disposition: A | Discharge status: Home / Self Care | Payer: Medicare Other | Source: Ambulatory Visit | Attending: Physician Assistant | Admitting: Physician Assistant

## 2016-04-23 ENCOUNTER — Other Ambulatory Visit: Payer: Self-pay | Admitting: Physician Assistant

## 2016-04-23 DIAGNOSIS — N644 Mastodynia: Secondary | ICD-10-CM

## 2016-04-23 DIAGNOSIS — N6310 Unspecified lump in the right breast, unspecified quadrant: Secondary | ICD-10-CM

## 2016-04-23 DIAGNOSIS — R590 Localized enlarged lymph nodes: Secondary | ICD-10-CM

## 2016-04-23 DIAGNOSIS — N63 Unspecified lump in breast: Secondary | ICD-10-CM | POA: Diagnosis not present

## 2016-04-28 ENCOUNTER — Other Ambulatory Visit: Payer: Self-pay | Admitting: Family Medicine

## 2016-05-11 ENCOUNTER — Other Ambulatory Visit: Payer: Self-pay | Admitting: Family Medicine

## 2016-05-23 ENCOUNTER — Encounter: Payer: Self-pay | Admitting: Family

## 2016-05-23 ENCOUNTER — Ambulatory Visit (INDEPENDENT_AMBULATORY_CARE_PROVIDER_SITE_OTHER): Payer: Medicare Other | Admitting: Family

## 2016-05-23 VITALS — BP 104/68 | HR 69 | Temp 97.3°F | Ht 65.0 in | Wt 157.0 lb

## 2016-05-23 DIAGNOSIS — K2100 Gastro-esophageal reflux disease with esophagitis, without bleeding: Secondary | ICD-10-CM

## 2016-05-23 MED ORDER — PANTOPRAZOLE SODIUM 40 MG PO TBEC: 40.0000 mg | DELAYED_RELEASE_TABLET | Freq: Every day | ORAL | 3 refills | Status: DC

## 2016-05-23 NOTE — Progress Notes (Signed)
   Subjective:    Patient ID: Margaret Barker, female    DOB: 02/05/1971, 45 y.o.   MRN: [id number]  Gastroesophageal Reflux  She complains of belching, coughing, heartburn, a hoarse voice, nausea, a sore throat and wheezing. She reports no dysphagia. This is a chronic problem. The current episode started more than 1 year ago. The problem occurs constantly. The problem has been waxing and waning. The heartburn is located in the substernum. The heartburn is of severe intensity. The symptoms are aggravated by certain foods, lying down, smoking and caffeine. Risk factors include smoking/tobacco exposure. She has tried a PPI for the symptoms. The treatment provided mild relief.      Review of Systems  Constitutional: Negative.   HENT: Positive for hoarse voice and sore throat.   Respiratory: Positive for cough and wheezing.   Gastrointestinal: Positive for heartburn and nausea. Negative for constipation, diarrhea and dysphagia.  Genitourinary: Negative.   All other systems reviewed and are negative.      Objective:   Physical Exam  Constitutional: She is oriented to person, place, and time. She appears well-developed and well-nourished. No distress.  HENT:  Head: Normocephalic and atraumatic.  Eyes: Pupils are equal, round, and reactive to light.  Neck: Normal range of motion. Neck supple. No thyromegaly present.  Cardiovascular: Normal rate, regular rhythm, normal heart sounds and intact distal pulses.   No murmur heard. Pulmonary/Chest: Effort normal and breath sounds normal. No respiratory distress. She has no wheezes.  Abdominal: Soft. Bowel sounds are normal. She exhibits no distension. There is no tenderness.  Musculoskeletal: Normal range of motion. She exhibits no edema or tenderness.  Neurological: She is alert and oriented to person, place, and time.  Skin: Skin is warm and dry.  Psychiatric: She has a normal mood and affect. Her behavior is normal. Judgment and thought content  normal.  Vitals reviewed.   BP 104/68 (BP Location: Right Arm)   Pulse 69   Temp 97.3 F (36.3 C) (Oral)   Ht 5\' 5"  (1.651 m)   Wt 157 lb (71.2 kg)   BMI 26.13 kg/m        Assessment & Plan:  1. Gastroesophageal reflux disease with esophagitis -Diet discussed, Avoid spicy, fried foods, caffeine -Avoid eating 2-3 hours before bed -Smoking cessation -RTO  In 3 months - pantoprazole (PROTONIX) 40 MG tablet; Take 1 tablet (40 mg total) by mouth daily.  Dispense: 30 tablet; Refill: [place], FNP

## 2016-05-23 NOTE — Patient Instructions (Signed)
Food Choices for Gastroesophageal Reflux Disease, Adult When you have gastroesophageal reflux disease (GERD), the foods you eat and your eating habits are very important. Choosing the right foods can help ease the discomfort of GERD. WHAT GENERAL GUIDELINES DO I NEED TO FOLLOW?  Choose fruits, vegetables, whole grains, low-fat dairy products, and low-fat meat, fish, and poultry.  Limit fats such as oils, salad dressings, butter, nuts, and avocado.  Keep a food diary to identify foods that cause symptoms.  Avoid foods that cause reflux. These may be different for different people.  Eat frequent small meals instead of three large meals each day.  Eat your meals slowly, in a relaxed setting.  Limit fried foods.  Cook foods using methods other than frying.  Avoid drinking alcohol.  Avoid drinking large amounts of liquids with your meals.  Avoid bending over or lying down until 2-3 hours after eating. WHAT FOODS ARE NOT RECOMMENDED? The following are some foods and drinks that may worsen your symptoms: Vegetables Tomatoes. Tomato juice. Tomato and spaghetti sauce. Chili peppers. Onion and garlic. Horseradish. Fruits Oranges, grapefruit, and lemon (fruit and juice). Meats High-fat meats, fish, and poultry. This includes hot dogs, ribs, ham, sausage, salami, and bacon. Dairy Whole milk and chocolate milk. Sour cream. Cream. Butter. Ice cream. Cream cheese.  Beverages Coffee and tea, with or without caffeine. Carbonated beverages or energy drinks. Condiments Hot sauce. Barbecue sauce.  Sweets/Desserts Chocolate and cocoa. Donuts. Peppermint and spearmint. Fats and Oils High-fat foods, including [place] fries and potato chips. Other Vinegar. Strong spices, such as black pepper, white pepper, red pepper, cayenne, curry powder, cloves, ginger, and chili powder. The items listed above may not be a complete list of foods and beverages to avoid. Contact your dietitian for more  information.   This information is not intended to replace advice given to you by your health care provider. Make sure you discuss any questions you have with your health care provider.   Document Released: 09/29/2005 Document Revised: 10/20/2014 Document Reviewed: 08/03/2013 Elsevier Interactive Patient Education 2016 [employer]. Gastroesophageal Reflux Disease, Adult Normally, food travels down the esophagus and stays in the stomach to be digested. However, when a person has gastroesophageal reflux disease (GERD), food and stomach acid move back up into the esophagus. When this happens, the esophagus becomes sore and inflamed. Over time, GERD can create small holes (ulcers) in the lining of the esophagus.  CAUSES This condition is caused by a problem with the muscle between the esophagus and the stomach (lower esophageal sphincter, or LES). Normally, the LES muscle closes after food passes through the esophagus to the stomach. When the LES is weakened or abnormal, it does not close properly, and that allows food and stomach acid to go back up into the esophagus. The LES can be weakened by certain dietary substances, medicines, and medical conditions, including:  Tobacco use.  Pregnancy.  Having a hiatal hernia.  Heavy alcohol use.  Certain foods and beverages, such as coffee, chocolate, onions, and peppermint. RISK FACTORS This condition is more likely to develop in:  People who have an increased body weight.  People who have connective tissue disorders.  People who use NSAID medicines. SYMPTOMS Symptoms of this condition include:  Heartburn.  Difficult or painful swallowing.  The feeling of having a lump in the throat.  Abitter taste in the mouth.  Bad breath.  Having a large amount of saliva.  Having an upset or bloated stomach.  Belching.  Chest pain.  Shortness of breath or wheezing.  Ongoing (chronic) cough or a night-time cough.  Wearing away of tooth  enamel.  Weight loss. Different conditions can cause chest pain. Make sure to see your health care provider if you experience chest pain. DIAGNOSIS Your health care provider will take a medical history and perform a physical exam. To determine if you have mild or severe GERD, your health care provider may also monitor how you respond to treatment. You may also have other tests, including:  An endoscopy toexamine your stomach and esophagus with a small camera.  A test thatmeasures the acidity level in your esophagus.  A test thatmeasures how much pressure is on your esophagus.  A barium swallow or modified barium swallow to show the shape, size, and functioning of your esophagus. TREATMENT The goal of treatment is to help relieve your symptoms and to prevent complications. Treatment for this condition may vary depending on how severe your symptoms are. Your health care provider may recommend:  Changes to your diet.  Medicine.  Surgery. HOME CARE INSTRUCTIONS Diet  Follow a diet as recommended by your health care provider. This may involve avoiding foods and drinks such as:  Coffee and tea (with or without caffeine).  Drinks that containalcohol.  Energy drinks and sports drinks.  Carbonated drinks or sodas.  Chocolate and cocoa.  Peppermint and mint flavorings.  Garlic and onions.  Horseradish.  Spicy and acidic foods, including peppers, chili powder, curry powder, vinegar, hot sauces, and barbecue sauce.  Citrus fruit juices and citrus fruits, such as oranges, lemons, and limes.  Tomato-based foods, such as red sauce, chili, salsa, and pizza with red sauce.  Fried and fatty foods, such as donuts, french fries, potato chips, and high-fat dressings.  High-fat meats, such as hot dogs and fatty cuts of red and white meats, such as rib eye steak, sausage, ham, and bacon.  High-fat dairy items, such as whole milk, butter, and cream cheese.  Eat small, frequent  meals instead of large meals.  Avoid drinking large amounts of liquid with your meals.  Avoid eating meals during the 2-3 hours before bedtime.  Avoid lying down right after you eat.  Do not exercise right after you eat. General Instructions  Pay attention to any changes in your symptoms.  Take over-the-counter and prescription medicines only as told by your health care provider. Do not take aspirin, ibuprofen, or other NSAIDs unless your health care provider told you to do so.  Do not use any tobacco products, including cigarettes, chewing tobacco, and e-cigarettes. If you need help quitting, ask your health care provider.  Wear loose-fitting clothing. Do not wear anything tight around your waist that causes pressure on your abdomen.  Raise (elevate) the head of your bed 6 inches (15cm).  Try to reduce your stress, such as with yoga or meditation. If you need help reducing stress, ask your health care provider.  If you are overweight, reduce your weight to an amount that is healthy for you. Ask your health care provider for guidance about a safe weight loss goal.  Keep all follow-up visits as told by your health care provider. This is important. SEEK MEDICAL CARE IF:  You have new symptoms.  You have unexplained weight loss.  You have difficulty swallowing, or it hurts to swallow.  You have wheezing or a persistent cough.  Your symptoms do not improve with treatment.  You have a hoarse voice. SEEK IMMEDIATE MEDICAL CARE IF:  You have pain  in your arms, neck, jaw, teeth, or back.  You feel sweaty, dizzy, or light-headed.  You have chest pain or shortness of breath.  You vomit and your vomit looks like blood or coffee grounds.  You faint.  Your stool is bloody or black.  You cannot swallow, drink, or eat.   This information is not intended to replace advice given to you by your health care provider. Make sure you discuss any questions you have with your health  care provider.   Document Released: 07/09/2005 Document Revised: 06/20/2015 Document Reviewed: 01/24/2015 Elsevier Interactive Patient Education [employer].

## 2016-05-26 DIAGNOSIS — F329 Major depressive disorder, single episode, unspecified: Secondary | ICD-10-CM | POA: Diagnosis not present

## 2016-06-17 DIAGNOSIS — M533 Sacrococcygeal disorders, not elsewhere classified: Secondary | ICD-10-CM | POA: Diagnosis not present

## 2016-06-17 DIAGNOSIS — Z4789 Encounter for other orthopedic aftercare: Secondary | ICD-10-CM | POA: Diagnosis not present

## 2016-06-17 DIAGNOSIS — M5441 Lumbago with sciatica, right side: Secondary | ICD-10-CM | POA: Diagnosis not present

## 2016-06-17 DIAGNOSIS — M545 Low back pain: Secondary | ICD-10-CM | POA: Diagnosis not present

## 2016-06-25 ENCOUNTER — Telehealth: Payer: Self-pay | Admitting: Family Medicine

## 2016-06-25 ENCOUNTER — Encounter: Payer: Self-pay | Admitting: Family Medicine

## 2016-06-25 ENCOUNTER — Ambulatory Visit (INDEPENDENT_AMBULATORY_CARE_PROVIDER_SITE_OTHER): Payer: Medicare Other | Admitting: Family Medicine

## 2016-06-25 VITALS — BP 108/67 | HR 60 | Temp 97.1°F | Ht 65.0 in | Wt 160.1 lb

## 2016-06-25 DIAGNOSIS — R1013 Epigastric pain: Secondary | ICD-10-CM

## 2016-06-25 MED ORDER — AMOXICILL-CLARITHRO-LANSOPRAZ PO MISC: Freq: Two times a day (BID) | ORAL | 0 refills | Status: DC

## 2016-06-25 MED ORDER — ONDANSETRON 8 MG PO TBDP: 8.0000 mg | ORAL_TABLET | Freq: Four times a day (QID) | ORAL | 1 refills | Status: DC | PRN

## 2016-06-25 NOTE — Progress Notes (Addendum)
Subjective:  Patient ID: Margaret Barker, female    DOB: 07-27-1971  Age: 45 y.o. MRN: [id number]  CC: Abdominal Pain (pt here today w/ hx of H.Pylori and she has had epigastric pain, nausea, and increased belching over the past month. Pt was started on Protonix one month ago and she states it isn't helping.)   HPI CESILIA SHINN presents for Patients as noted above. Of note is that she has had her gallbladder removed. Her discomfort is in the epigastrium. It is described as heartburn. There is accompanying nausea. She had some emesis this morning.   History Aubri has a past medical history of Alcoholism ([place]); Anxiety; Arthritis; Bipolar disorder ([place]); Depression; Elevated liver enzymes (2015); Erosion of vaginal wall due to surgical mesh; Esophagitis; Frequency of urination; GERD (gastroesophageal reflux disease); Helicobacter pylori gastritis; History of panic attacks; Hyperlipidemia; Nocturia; Panic attacks; SUI (stress urinary incontinence, female); and Wears glasses.   She has a past surgical history that includes [place] (08-09-2009 DR Gaynelle Arabian); LEFT BREAST EXCISIONAL BX (03-26-2007); Laparoscopic assisted vaginal hysterectomy (10-22-2006); RIGHT EXCISIONAL BREAST BX (10-23-2005); Cholecystectomy (1992); D & C HYSTEROSCOPY W/ POLYP REMOVAL (04-22-2006); Lesion removal (07/09/2012); Tubal ligation; Pubovaginal sling (N/A, 04/24/2014); Cystoscopy (N/A, 04/24/2014); Abdominal hysterectomy; Colonoscopy (N/A, 10/23/2014); Esophagogastroduodenoscopy (egd) with propofol (N/A, 10/23/2014); and Sacroiliac joint fusion (Right, 12/20/2015).   Her family history includes COPD in her father; Cervical cancer in her paternal grandmother; Colitis in her father and sister; Colon cancer in her paternal uncle; Diabetes in her mother; Heart disease in her father; Hypertension in her father; Liver cancer in her paternal  uncle.She reports that she has been smoking Cigarettes.  She has a 12.00 pack-year smoking history. She has never used smokeless tobacco. She reports that she uses drugs, including Marijuana. She reports that she does not drink alcohol.    ROS Review of Systems  Constitutional: Negative for activity change, appetite change and fever.  HENT: Negative for congestion, rhinorrhea and sore throat.   Eyes: Negative for visual disturbance.  Respiratory: Negative for cough and shortness of breath.   Cardiovascular: Negative for chest pain and palpitations.  Gastrointestinal: Positive for abdominal pain, nausea and vomiting (probably more accurately is reflux, based on description of sx). Negative for diarrhea.  Genitourinary: Negative for dysuria.  Musculoskeletal: Negative for arthralgias and myalgias.    Objective:  BP 108/67   Pulse 60   Temp 97.1 F (36.2 C) (Oral)   Ht _0  (1.651 m)   Wt 160 lb 2 oz (72.6 kg)   BMI 26.65 kg/m   BP Readings from Last 3 Encounters:  06/25/16 108/67  05/23/16 104/68  04/16/16 111/75    Wt Readings from Last 3 Encounters:  06/25/16 160 lb 2 oz (72.6 kg)  05/23/16 157 lb (71.2 kg)  04/16/16 159 lb 9.6 oz (72.4 kg)     Physical Exam  Constitutional: She is oriented to person, place, and time. She appears well-developed and well-nourished. No distress.  HENT:  Head: Normocephalic and atraumatic.  Right Ear: External ear normal.  Left Ear: External ear normal.  Nose: Nose normal.  Mouth/Throat: Oropharynx is clear and moist.  Eyes: Conjunctivae and EOM are normal. Pupils are equal, round, and reactive to light.  Neck: Normal range of motion. Neck supple. No thyromegaly present.  Cardiovascular: Normal rate, regular rhythm and normal heart sounds.   No murmur heard. Pulmonary/Chest: Effort normal and breath sounds normal. No respiratory distress.  She has no wheezes. She has no rales.  Abdominal: Soft. Bowel sounds are normal. She exhibits no  distension. There is no tenderness.  Lymphadenopathy:    She has no cervical adenopathy.  Neurological: She is alert and oriented to person, place, and time. She has normal reflexes.  Skin: Skin is warm and dry.  Psychiatric: She has a normal mood and affect. Her behavior is normal. Judgment and thought content normal.     Lab Results  Component Value Date   WBC 12.0 (H) 03/19/2016   HGB 14.1 12/17/2015   HCT 43.6 03/19/2016   PLT 318 03/19/2016   GLUCOSE 85 03/19/2016   CHOL 281 (H) 07/20/2015   TRIG 190 (H) 07/20/2015   HDL 58 07/20/2015   LDLCALC 185 (H) 07/20/2015   ALT 20 03/19/2016   AST 17 03/19/2016   NA 140 03/19/2016   K 4.9 03/19/2016   CL 98 03/19/2016   CREATININE 0.86 03/19/2016   BUN 13 03/19/2016   CO2 21 03/19/2016   TSH 1.380 07/20/2015    [place] Extrem Up Right Ltd  Result Date: 04/23/2016 CLINICAL DATA:  Right axillary mass felt by the patient. EXAM: 2D DIGITAL DIAGNOSTIC RIGHT MAMMOGRAM WITH CAD AND ADJUNCT TOMO ULTRASOUND RIGHT BREAST COMPARISON:  Previous exam(s). ACR Breast Density Category b: There are scattered areas of fibroglandular density. FINDINGS: Mammographically, there are no suspicious masses, areas of architectural distortion or microcalcifications in the right breast or right axilla. Mammographic images were processed with CAD. On physical exam, no suspicious masses are found. The area of palpable concern in the right axilla corresponds to what palpates as a prominent subcutaneous fat. Targeted ultrasound is performed, showing no suspicious masses or shadowing lesions. Benign-appearing right axillary lymph nodes are noted underneath the area of palpable concern. IMPRESSION: No mammographic sonographic evidence of malignancy in the right breast. RECOMMENDATION: Screening mammogram in one year.(Code:SM-B-01Y) I have discussed the findings and recommendations with the patient. Results were also provided in writing at the conclusion of the visit. If  applicable, a reminder letter will be sent to the patient regarding the next appointment. BI-RADS CATEGORY  2: Benign. Electronically Signed   By: Fidela Salisbury M.D.   On: 04/23/2016 12:08   Mm Diag Breast Tomo Uni Right  Result Date: 04/23/2016 CLINICAL DATA:  Right axillary mass felt by the patient. EXAM: 2D DIGITAL DIAGNOSTIC RIGHT MAMMOGRAM WITH CAD AND ADJUNCT TOMO ULTRASOUND RIGHT BREAST COMPARISON:  Previous exam(s). ACR Breast Density Category b: There are scattered areas of fibroglandular density. FINDINGS: Mammographically, there are no suspicious masses, areas of architectural distortion or microcalcifications in the right breast or right axilla. Mammographic images were processed with CAD. On physical exam, no suspicious masses are found. The area of palpable concern in the right axilla corresponds to what palpates as a prominent subcutaneous fat. Targeted ultrasound is performed, showing no suspicious masses or shadowing lesions. Benign-appearing right axillary lymph nodes are noted underneath the area of palpable concern. IMPRESSION: No mammographic sonographic evidence of malignancy in the right breast. RECOMMENDATION: Screening mammogram in one year.(Code:SM-B-01Y) I have discussed the findings and recommendations with the patient. Results were also provided in writing at the conclusion of the visit. If applicable, a reminder letter will be sent to the patient regarding the next appointment. BI-RADS CATEGORY  2: Benign. Electronically Signed   By: Fidela Salisbury M.D.   On: 04/23/2016 12:08    Assessment & Plan:   Sania was seen today for abdominal pain.  Diagnoses and  all orders for this visit:  Abdominal pain, epigastric -     H. pylori antigen, stool  Other orders -     amoxicillin-clarithromycin-lansoprazole (PREVPAC) combo pack; Take by mouth 2 (two) times daily. Follow package directions. -     ondansetron (ZOFRAN-ODT) 8 MG disintegrating tablet; Take 1 tablet (8 mg  total) by mouth every 6 (six) hours as needed for nausea or vomiting.      I have discontinued Ms. Koci's atorvastatin and ranitidine. I am also having her start on amoxicillin-clarithromycin-lansoprazole and ondansetron. Additionally, I am having her maintain her busPIRone, estradiol, traZODone, DULoxetine, VESICARE, montelukast, and pantoprazole.  Meds ordered this encounter  Medications  . amoxicillin-clarithromycin-lansoprazole (PREVPAC) combo pack    Sig: Take by mouth 2 (two) times daily. Follow package directions.    Dispense:  1 kit    Refill:  0  . ondansetron (ZOFRAN-ODT) 8 MG disintegrating tablet    Sig: Take 1 tablet (8 mg total) by mouth every 6 (six) hours as needed for nausea or vomiting.    Dispense:  20 tablet    Refill:  1     Follow-up: Return in about 1 month (around 07/25/2016).  Claretta Fraise, M.D.

## 2016-06-25 NOTE — Addendum Note (Signed)
Addended by: Claretta Fraise on: 06/25/2016 03:38 PM   Modules accepted: Orders

## 2016-06-26 ENCOUNTER — Other Ambulatory Visit: Payer: Medicare Other

## 2016-06-26 ENCOUNTER — Telehealth: Payer: Self-pay | Admitting: Family Medicine

## 2016-06-26 DIAGNOSIS — R1013 Epigastric pain: Secondary | ICD-10-CM | POA: Diagnosis not present

## 2016-06-26 NOTE — Telephone Encounter (Signed)
Will try to get [facility] will fax over request

## 2016-06-26 NOTE — Telephone Encounter (Signed)
The pharmacy called and said the Prevpac interacts with her cholesterol medicine and wanted to see if it was ok to stop the cholesterol medication while on the Prevpac and then resume cholesterol med after finishing Prevpac. Advised pharmacy ok to stop cholesterol med while on Prevpac and restart after finished with Prevpac.

## 2016-06-26 NOTE — Telephone Encounter (Signed)
Pt instructed to eat BRAt diet Verbalizes understanding

## 2016-06-28 LAB — H. PYLORI ANTIGEN, STOOL: H PYLORI AG STL: NEGATIVE

## 2016-07-01 ENCOUNTER — Ambulatory Visit (INDEPENDENT_AMBULATORY_CARE_PROVIDER_SITE_OTHER): Payer: Medicare Other | Admitting: *Deleted

## 2016-07-01 VITALS — BP 104/68 | HR 70 | Ht 64.0 in | Wt 158.0 lb

## 2016-07-01 DIAGNOSIS — R1010 Upper abdominal pain, unspecified: Secondary | ICD-10-CM

## 2016-07-01 DIAGNOSIS — R112 Nausea with vomiting, unspecified: Secondary | ICD-10-CM

## 2016-07-01 DIAGNOSIS — K5900 Constipation, unspecified: Secondary | ICD-10-CM

## 2016-07-01 DIAGNOSIS — Z0000 Encounter for general adult medical examination without abnormal findings: Secondary | ICD-10-CM

## 2016-07-01 NOTE — Progress Notes (Signed)
Subjective:   Margaret Barker is a 45 y.o. female who presents for an Initial Medicare Annual Wellness Visit. Margaret Barker lives at home with her husband and their dog. They have fish as well. They have 3 adult children and 4 grandchildren. Prior to obtaining disability Margaret Barker worked [employer]. She isn't currently active in any social or community groups and doesn't have any hobbies. She was recently prescribed Prevpac for suspected H Pylori infection. Results were negative and patient did not pick up medication due to cost.    Review of Systems    Neurological: Tingling in bilateral feet  GI: Continues to have some episodes of constipation and abd pain.  Cardiac Risk Factors include: smoking/ tobacco exposure;sedentary lifestyle  Other systems negative.     Objective:    Today's Vitals   07/01/16 0943  BP: 104/68  Pulse: 70  Weight: 158 lb (71.7 kg)  Height: 5\' 4"  (1.626 m)   Body mass index is 27.12 kg/m.   Current Medications (verified) Outpatient Encounter Prescriptions as of 07/01/2016  Medication Sig  . busPIRone (BUSPAR) 10 MG tablet Take 10 mg by mouth 3 (three) times daily.   . DULoxetine (CYMBALTA) 30 MG capsule   . estradiol (ESTRACE) 2 MG tablet Take 21 days then off 7 days. (Patient taking differently: Take 2 mg by mouth daily. Take 21 days then off 7 days.)  . montelukast (SINGULAIR) 10 MG tablet TAKE 1 TABLET (10 MG TOTAL) BY MOUTH DAILY. FOR ALLERGY  . ondansetron (ZOFRAN-ODT) 8 MG disintegrating tablet Take 1 tablet (8 mg total) by mouth every 6 (six) hours as needed for nausea or vomiting.  . pantoprazole (PROTONIX) 40 MG tablet Take 1 tablet (40 mg total) by mouth daily.  . traZODone (DESYREL) 100 MG tablet Take 300 mg by mouth at bedtime as needed for sleep  . VESICARE 10 MG tablet TAKE 1 TABLET (10 MG TOTAL) BY MOUTH EVERY MORNING. REPORTED ON 02/07/2016  . amoxicillin-clarithromycin-lansoprazole (PREVPAC) combo pack Take by mouth 2 (two)  times daily. Follow package directions. (Patient not taking: Reported on 07/01/2016)   Facility-Administered Encounter Medications as of 07/01/2016  Medication  . bupivacaine-EPINEPHrine (MARCAINE W/ EPI) 0.25 % (with pres) injection 20 mL    Allergies (verified) Depakote er [divalproex sodium er]; Lyrica [pregabalin]; Neurontin [gabapentin]; Enablex [darifenacin hydrobromide er]; Naprosyn [naproxen]; Prednisone; and Tramadol   History: Past Medical History:  Diagnosis Date  . Alcoholism ([place])   . Anxiety   . Arthritis    stenosis & scoliosis of lumbar area    . Bipolar disorder ([place])   . Depression   . Elevated liver enzymes 2015   pt. reports as of last check, liver enymes much better   . Erosion of vaginal wall due to surgical mesh   . Esophagitis   . Frequency of urination   . GERD (gastroesophageal reflux disease)   . Helicobacter pylori gastritis    DX  04-13-2014-- TREATED WITH ANTBIOTICS  . History of panic attacks   . Hyperlipidemia   . Nocturia   . Panic attacks   . SUI (stress urinary incontinence, female)   . Wears glasses    Past Surgical History:  Procedure Laterality Date  . ABDOMINAL HYSTERECTOMY    . CHOLECYSTECTOMY  1992  . COLONOSCOPY N/A 10/23/2014   Procedure: COLONOSCOPY;  Surgeon: Inda Castle, MD;  Location: [facility];  Service: Endoscopy;  Laterality: N/A;  . CYSTOSCOPY N/A 04/24/2014   Procedure: CYSTOSCOPY;  Surgeon:  Ailene Rud, MD;  Location: [facility];  Service: Urology;  Laterality: N/A;  . D & C HYSTEROSCOPY W/ POLYP REMOVAL  04-22-2006  . ESOPHAGOGASTRODUODENOSCOPY (EGD) WITH PROPOFOL N/A 10/23/2014   Procedure: ESOPHAGOGASTRODUODENOSCOPY (EGD) WITH PROPOFOL;  Surgeon: Inda Castle, MD;  Location: [facility];  Service: Endoscopy;  Laterality: N/A;  . LAPAROSCOPIC ASSISTED VAGINAL HYSTERECTOMY  10-22-2006  . LEFT BREAST EXCISIONAL BX  03-26-2007   BENIGN  . LESION REMOVAL  07/09/2012   Procedure: EXCISION  VAGINAL LESION;  Surgeon: Ailene Rud, MD;  Location[facility];  Service: Urology;  Laterality: N/A;  Excision of Vaginal apical cyst  . PUBOVAGINAL SLING N/A 04/24/2014   Procedure: EXCISE OF VAGINAL APICAL MESH EXTRUSION;  Surgeon: Ailene Rud, MD;  Location[facility];  Service: Urology;  Laterality: N/A;  . RIGHT EXCISIONAL BREAST BX  10-23-2005   BENIGN  . SACROILIAC JOINT FUSION Right 12/20/2015   Procedure: RIGHT SACROILIAC  FUSION;  Surgeon: Melina Schools, MD;  Location: [place];  Service: Orthopedics;  Laterality: Right;  . SACROSPINUS ANTERIOR CULPOSUSPENSION WITH UPHOLD MESH AND SOLYX SINGLE INCISION TRANSURETHRAL SLING  08-09-2009 DR TANNENBAUM   STRESS INCONTINENCE W/ PELVIC FLOOR PROLAPSE  . TUBAL LIGATION     Family History  Problem Relation Age of Onset  . Diabetes Mother   . Hypertension Mother   . Heart disease Father   . COPD Father   . Hypertension Father   . Colitis Father   . Colitis Sister   . Cervical cancer Paternal Grandmother   . Colon cancer Paternal Uncle   . Liver cancer Paternal Uncle   . Healthy Daughter   . Healthy Son   . Healthy Daughter    Social History   Occupational History  . Not on file.   Social History Main Topics  . Smoking status: Current Every Day Smoker    Packs/day: 0.50    Years: 24.00    Types: Cigarettes  . Smokeless tobacco: Never Used  . Alcohol use No     Comment: last time 2013  . Drug use:     Types: Marijuana     Comment: occasional  . Sexual activity: Yes    Birth control/ protection: Surgical    Tobacco Counseling Not ready to quite at this time.   Activities of Daily Living In your present state of health, do you have any difficulty performing the following activities: 07/01/2016 03/19/2016  Hearing? N N  Vision? N N  Difficulty concentrating or making decisions? N N  Walking or climbing stairs? N N  Dressing or bathing? N N  Doing errands, shopping? N N    Preparing Food and eating ? N -  Using the Toilet? N -  In the past six months, have you accidently leaked urine? Y -  Do you have problems with loss of bowel control? N -  Managing your Medications? N -  Managing your Finances? N -  Housekeeping or managing your Housekeeping? N -  Some recent data might be hidden    Immunizations and Health Maintenance Immunization History  Administered Date(s) Administered  . Influenza Split 07/05/2013  . Influenza Whole 07/27/2008  . Influenza-Unspecified 07/03/2014  . Pneumococcal Conjugate-13 07/03/2014  . Td 10/13/1996   Health Maintenance Due  Topic Date Due  . INFLUENZA VACCINE  05/13/2016    Patient Care Team: Claretta Fraise, MD as PCP - General (Family Medicine) Robin Searing, MD (Psychiatry)  Erskine Emery, MD-Gastroenterology      Assessment:   This is a routine wellness examination for [place].  Hearing/Vision screen Eye exam last year at [facility] in [place]. Will schedule yearly exam. No problems with vision or hearing reported.   Dietary issues and exercise activities discussed:  Patient reports eating one large meal daily late in the evening. Does not feel hungry throughout the day.   Goals    . Exercise 3x per week (30 min per time)          Walk for 30 minutes at least 3 times per week    . Have 3 meals a day          Try to eat at least 2 meals per day and 3 if possible. If you're not hungry enough for a meal try a light snack like fruits or vegetables.      . Increase water intake      Depression Screen PHQ 2/9 Scores 07/01/2016 06/25/2016 05/23/2016 04/16/2016 04/09/2016 03/19/2016 03/17/2016  PHQ - 2 Score 0 0 0 0 0 0 0  PHQ- 9 Score - - - - - - -    Fall Risk Fall Risk  07/01/2016 06/25/2016 05/23/2016 04/09/2016 08/22/2015  Falls in the past year? No No No No No    Cognitive Function: MMSE - Mini Mental State Exam 07/01/2016 07/06/2015  Orientation to time 4 4  Orientation to Place 5 5  Registration  3 3  Attention/ Calculation 4 5  Recall 1 0  Language- name 2 objects 2 2  Language- repeat 1 1  Language- follow 3 step command 3 3  Language- read & follow direction 1 1  Write a sentence 1 1  Copy design 1 1  Total score 26 26    Screening Tests Health Maintenance  Topic Date Due  . INFLUENZA VACCINE  05/13/2016  . MAMMOGRAM  08/14/2016  . PAP SMEAR  07/19/2018  . TETANUS/TDAP  03/13/2022  . HIV Screening  Addressed      Plan:  F/u with Dr Livia Snellen tomorrow for tingling in feet and restless legs.  Take Miralax daily to help with constipation. Increase water and fiber intake as well. She will report any signs of bleeding,( ie, coffee ground emesis, black tarry stools) Will setup GI referral at request.   During the course of the visit, [place] was educated and counseled about the following appropriate screening and preventive services:   Vaccines- Declined flu shot. Other's up to date.   Cardiovascular disease screening  Colorectal cancer screening-Age 25 unless suggested otherwise  Diabetes screening-With routine labs  Glaucoma screening-yearly with eye exam  Mammography-Yearly screening due 04/2017  Nutrition counseling  Smoking cessation counseling  Patient Instructions (the written plan) were given to the patient.    Chong Sicilian, RN   07/01/2016    I have reviewed and agree with the above AWV documentation.  Claretta Fraise, M.D.

## 2016-07-01 NOTE — Patient Instructions (Signed)
  Margaret Barker ,  Thank you for taking time to come for your Medicare Wellness Visit. I appreciate your ongoing commitment to your health goals. Please review the following plan we discussed and let me know if I can assist you in the future.   These are the goals we discussed: Goals    . Exercise 3x per week (30 min per time)          Walk for 30 minutes at least 3 times per week    . Have 3 meals a day          Try to eat at least 2 meals per day and 3 if possible. If you're not hungry enough for a meal try a light snack like fruits or vegetables.      . Increase water intake       This is a list of the screening recommended for you and due dates:  Health Maintenance  Topic Date Due  . Flu Shot  05/13/2016  . Mammogram  08/14/2016  . Pap Smear  07/19/2018  . Tetanus Vaccine  03/13/2022  . HIV Screening  Addressed

## 2016-07-02 ENCOUNTER — Encounter: Payer: Self-pay | Admitting: Family Medicine

## 2016-07-02 ENCOUNTER — Ambulatory Visit (INDEPENDENT_AMBULATORY_CARE_PROVIDER_SITE_OTHER): Payer: Medicare Other | Admitting: Family Medicine

## 2016-07-02 VITALS — BP 94/59 | HR 2 | Temp 97.4°F | Ht 64.0 in | Wt 159.1 lb

## 2016-07-02 DIAGNOSIS — R202 Paresthesia of skin: Secondary | ICD-10-CM | POA: Diagnosis not present

## 2016-07-02 DIAGNOSIS — G2581 Restless legs syndrome: Secondary | ICD-10-CM | POA: Diagnosis not present

## 2016-07-02 MED ORDER — CARBIDOPA-LEVODOPA 10-100 MG PO TABS: 1.0000 | ORAL_TABLET | Freq: Three times a day (TID) | ORAL | 2 refills | Status: DC

## 2016-07-02 NOTE — Progress Notes (Signed)
Subjective:  Patient ID: Margaret Barker, female    DOB: [birth year]  Age: 45 y.o. MRN: [id number]  CC: Numbness (pt here today c/o numbness and tingling in arms at night and when she wakes up. She also has problems with what she thinks is "restless leg syndrome")   HPI Margaret Barker presents for Increasing discomfort at nighttime. She describes it as noted above. The numbness and tingling is moderate because it wakes her up during the night. Particularly it's noted that none of these symptoms happen through the day. She sleeps on her sides moving from one side to the other. The symptoms are equal on both sides with regard to her arms and with the numbness and tingling. However there is a right leg only discomfort that leads to cramping and having to get up and walk it off at night.   History Margaret Barker has a past medical history of Alcoholism ([place]); Anxiety; Arthritis; Bipolar disorder ([place]); Depression; Elevated liver enzymes (2015); Erosion of vaginal wall due to surgical mesh; Esophagitis; Frequency of urination; GERD (gastroesophageal reflux disease); Helicobacter pylori gastritis; History of panic attacks; Hyperlipidemia; Nocturia; Panic attacks; SUI (stress urinary incontinence, female); and Wears glasses.   She has a past surgical history that includes [place] (08-09-2009 DR Gaynelle Arabian); LEFT BREAST EXCISIONAL BX (03-26-2007); Laparoscopic assisted vaginal hysterectomy (10-22-2006); RIGHT EXCISIONAL BREAST BX (10-23-2005); Cholecystectomy (1992); D & C HYSTEROSCOPY W/ POLYP REMOVAL (04-22-2006); Lesion removal (07/09/2012); Tubal ligation; Pubovaginal sling (N/A, 04/24/2014); Cystoscopy (N/A, 04/24/2014); Abdominal hysterectomy; Colonoscopy (N/A, 10/23/2014); Esophagogastroduodenoscopy (egd) with propofol (N/A, 10/23/2014); and Sacroiliac joint fusion (Right, 12/20/2015).   Her family history includes COPD in her  father; Cervical cancer in her paternal grandmother; Colitis in her father and sister; Colon cancer in her paternal uncle; Diabetes in her mother; Healthy in her daughter, daughter, and son; Heart disease in her father; Hypertension in her father and mother; Liver cancer in her paternal uncle.She reports that she has been smoking Cigarettes.  She has a 12.00 pack-year smoking history. She has never used smokeless tobacco. She reports that she uses drugs, including Marijuana. She reports that she does not drink alcohol.    ROS Review of Systems  Constitutional: Negative for activity change, appetite change and fever.  HENT: Negative for congestion, rhinorrhea and sore throat.   Eyes: Negative for visual disturbance.  Respiratory: Negative for cough and shortness of breath.   Cardiovascular: Negative for chest pain and palpitations.  Gastrointestinal: Negative for abdominal pain, diarrhea and nausea.  Genitourinary: Negative for dysuria.  Musculoskeletal: Negative for arthralgias and myalgias.  Neurological: Positive for numbness. Negative for tremors and weakness.    Objective:  BP (!) 94/59   Pulse (!) 2   Temp 97.4 F (36.3 C) (Oral)   Ht 5\' 4"  (1.626 m)   Wt 159 lb 2 oz (72.2 kg)   BMI 27.31 kg/m   BP Readings from Last 3 Encounters:  07/02/16 (!) 94/59  07/01/16 104/68  06/25/16 108/67    Wt Readings from Last 3 Encounters:  07/02/16 159 lb 2 oz (72.2 kg)  07/01/16 158 lb (71.7 kg)  06/25/16 160 lb 2 oz (72.6 kg)     Physical Exam  Constitutional: She is oriented to person, place, and time. She appears well-developed and well-nourished. No distress.  HENT:  Head: Normocephalic and atraumatic.  Right Ear: External ear normal.  Left Ear: External ear normal.  Nose: Nose normal.  Mouth/Throat: Oropharynx  is clear and moist.  Eyes: Conjunctivae and EOM are normal. Pupils are equal, round, and reactive to light.  Neck: Normal range of motion. Neck supple. No thyromegaly  present.  Cardiovascular: Normal rate, regular rhythm and normal heart sounds.   No murmur heard. Pulmonary/Chest: Effort normal and breath sounds normal. No respiratory distress. She has no wheezes. She has no rales.  Abdominal: Soft. Bowel sounds are normal. She exhibits no distension. There is no tenderness.  Lymphadenopathy:    She has no cervical adenopathy.  Neurological: She is alert and oriented to person, place, and time. She has normal reflexes.  Skin: Skin is warm and dry.  Psychiatric: She has a normal mood and affect. Her behavior is normal. Judgment and thought content normal.     Lab Results  Component Value Date   WBC 12.0 (H) 03/19/2016   HGB 14.1 12/17/2015   HCT 43.6 03/19/2016   PLT 318 03/19/2016   GLUCOSE 85 03/19/2016   CHOL 281 (H) 07/20/2015   TRIG 190 (H) 07/20/2015   HDL 58 07/20/2015   LDLCALC 185 (H) 07/20/2015   ALT 20 03/19/2016   AST 17 03/19/2016   NA 140 03/19/2016   K 4.9 03/19/2016   CL 98 03/19/2016   CREATININE 0.86 03/19/2016   BUN 13 03/19/2016   CO2 21 03/19/2016   TSH 1.380 07/20/2015    [place] Extrem Up Right Ltd  Result Date: 04/23/2016 CLINICAL DATA:  Right axillary mass felt by the patient. EXAM: 2D DIGITAL DIAGNOSTIC RIGHT MAMMOGRAM WITH CAD AND ADJUNCT TOMO ULTRASOUND RIGHT BREAST COMPARISON:  Previous exam(s). ACR Breast Density Category b: There are scattered areas of fibroglandular density. FINDINGS: Mammographically, there are no suspicious masses, areas of architectural distortion or microcalcifications in the right breast or right axilla. Mammographic images were processed with CAD. On physical exam, no suspicious masses are found. The area of palpable concern in the right axilla corresponds to what palpates as a prominent subcutaneous fat. Targeted ultrasound is performed, showing no suspicious masses or shadowing lesions. Benign-appearing right axillary lymph nodes are noted underneath the area of palpable concern. IMPRESSION:  No mammographic sonographic evidence of malignancy in the right breast. RECOMMENDATION: Screening mammogram in one year.(Code:SM-B-01Y) I have discussed the findings and recommendations with the patient. Results were also provided in writing at the conclusion of the visit. If applicable, a reminder letter will be sent to the patient regarding the next appointment. BI-RADS CATEGORY  2: Benign. Electronically Signed   By: Fidela Salisbury M.D.   On: 04/23/2016 12:08   Mm Diag Breast Tomo Uni Right  Result Date: 04/23/2016 CLINICAL DATA:  Right axillary mass felt by the patient. EXAM: 2D DIGITAL DIAGNOSTIC RIGHT MAMMOGRAM WITH CAD AND ADJUNCT TOMO ULTRASOUND RIGHT BREAST COMPARISON:  Previous exam(s). ACR Breast Density Category b: There are scattered areas of fibroglandular density. FINDINGS: Mammographically, there are no suspicious masses, areas of architectural distortion or microcalcifications in the right breast or right axilla. Mammographic images were processed with CAD. On physical exam, no suspicious masses are found. The area of palpable concern in the right axilla corresponds to what palpates as a prominent subcutaneous fat. Targeted ultrasound is performed, showing no suspicious masses or shadowing lesions. Benign-appearing right axillary lymph nodes are noted underneath the area of palpable concern. IMPRESSION: No mammographic sonographic evidence of malignancy in the right breast. RECOMMENDATION: Screening mammogram in one year.(Code:SM-B-01Y) I have discussed the findings and recommendations with the patient. Results were also provided in writing at the conclusion  of the visit. If applicable, a reminder letter will be sent to the patient regarding the next appointment. BI-RADS CATEGORY  2: Benign. Electronically Signed   By: Fidela Salisbury M.D.   On: 04/23/2016 12:08    Assessment & Plan:   Margaret Barker was seen today for numbness.  Diagnoses and all orders for this visit:  Paresthesias -      Magnesium -     Phosphorus -     Vitamin B12  Restless leg syndrome -     Magnesium -     Phosphorus -     Vitamin B12  Other orders -     carbidopa-levodopa (SINEMET) 10-100 MG tablet; Take 1 tablet by mouth 3 (three) times daily.      I am having Margaret Barker start on carbidopa-levodopa. I am also having her maintain her busPIRone, estradiol, traZODone, DULoxetine, VESICARE, montelukast, pantoprazole, and ondansetron.  Meds ordered this encounter  Medications  . carbidopa-levodopa (SINEMET) 10-100 MG tablet    Sig: Take 1 tablet by mouth 3 (three) times daily.    Dispense:  90 tablet    Refill:  2     Follow-up: Return in about 6 weeks (around 08/13/2016).  Claretta Fraise, M.D.

## 2016-07-03 LAB — PHOSPHORUS: PHOSPHORUS: 2.4 mg/dL — AB (ref 2.5–4.5)

## 2016-07-03 LAB — VITAMIN B12: Vitamin B-12: 363 pg/mL (ref 211–946)

## 2016-07-03 LAB — MAGNESIUM: Magnesium: 2 mg/dL (ref 1.6–2.3)

## 2016-07-12 ENCOUNTER — Other Ambulatory Visit: Payer: Self-pay | Admitting: Family Medicine

## 2016-07-24 ENCOUNTER — Ambulatory Visit: Payer: Medicare Other | Admitting: Family Medicine

## 2016-07-28 ENCOUNTER — Encounter: Payer: Self-pay | Admitting: Family Medicine

## 2016-08-09 IMAGING — MR MR LUMBAR SPINE W/O CM
4 of 5 series · 15 of 48 positions shown · non-contrast
Comparison: CT Abdomen and Pelvis 09/29/2014.

CLINICAL DATA: 43-year-old female with right side lumbar back pain
radiating to the hip for 6 months with no known injury. Initial
encounter.

EXAM:
MRI LUMBAR SPINE WITHOUT CONTRAST
TECHNIQUE: Multiplanar, multisequence MR imaging of the lumbar spine was
performed. No intravenous contrast was administered.

[Series 3: T2 · sagittal · 4.0mm · 0.72mm/px · 6 of 17 slices shown (1 of 2)]
[im 1/17]
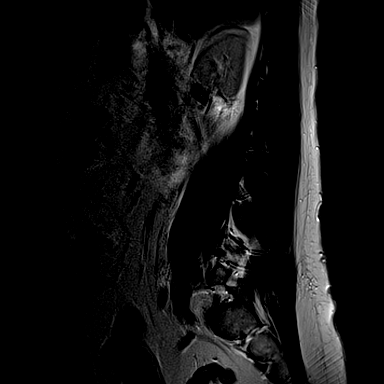
[im 3/17]
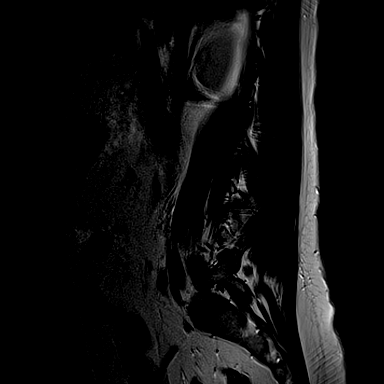
[im 6/17]
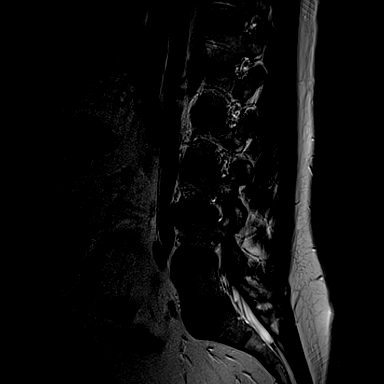
[im 9/17]
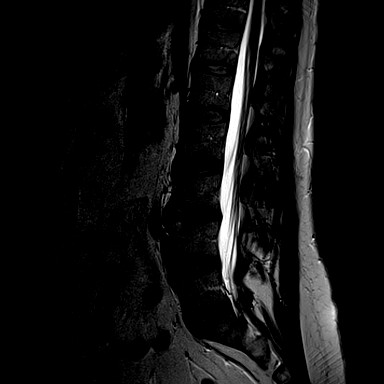
[im 11/17]
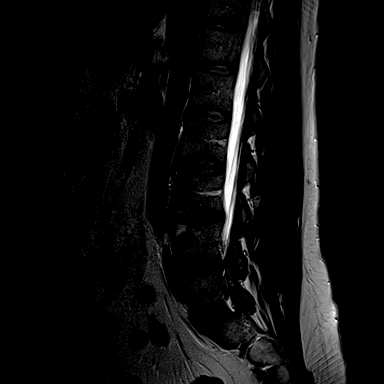
[im 14/17]
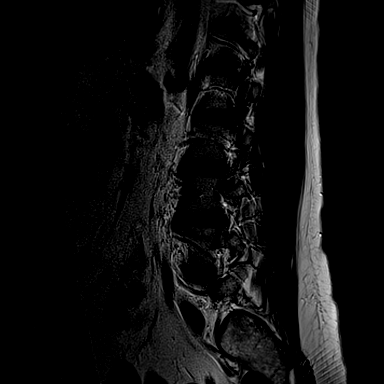

[Series 4: T1 · sagittal · 4.0mm · 0.39mm/px · 3 of 17 slices shown (1 of 2)]
[im 4/17]
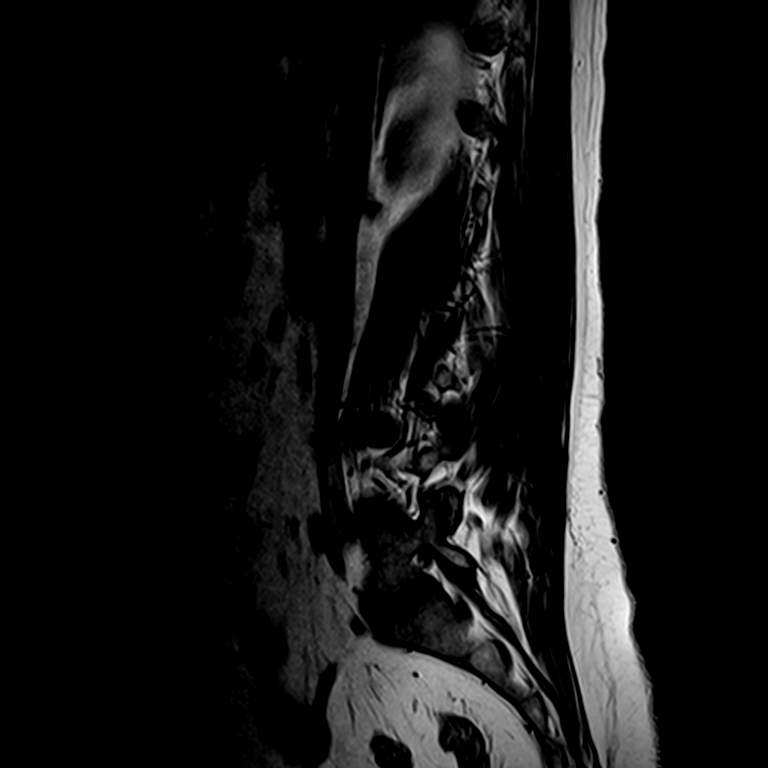
[im 10/17]
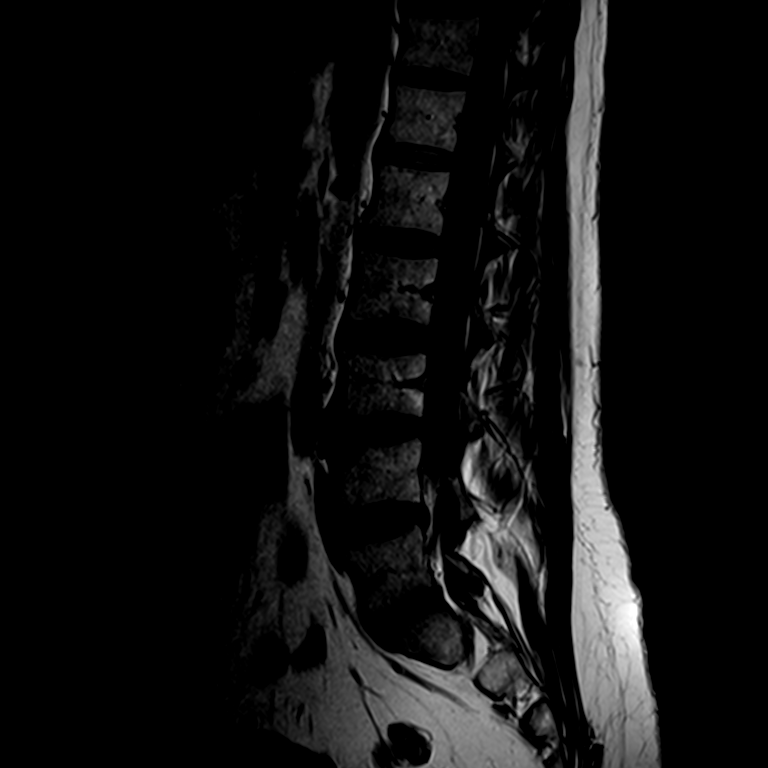
[im 17/17]
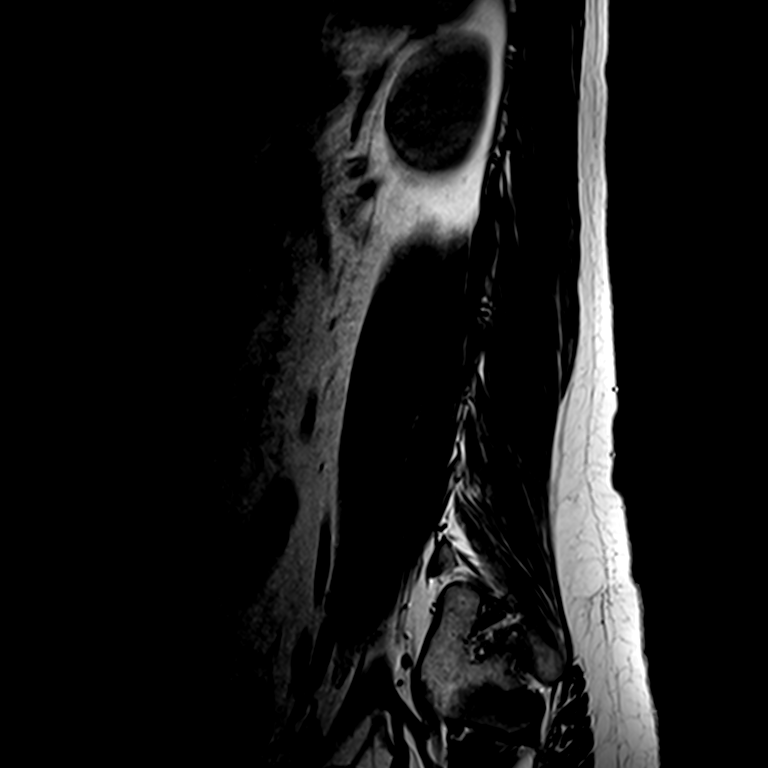

[Series 6: T2 · axial · 4.0mm · 0.22mm/px · z∈[-140,+9]mm · 3 of 43 slices shown (2 of 2)]
[im 7/43]
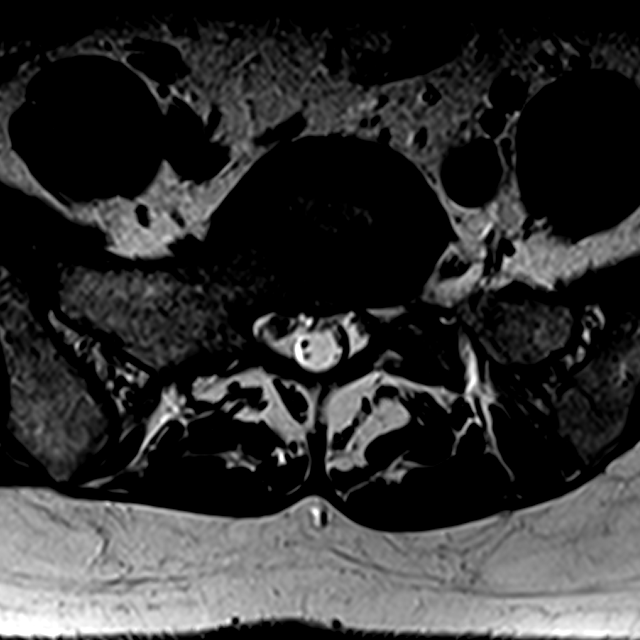
[im 22/43]
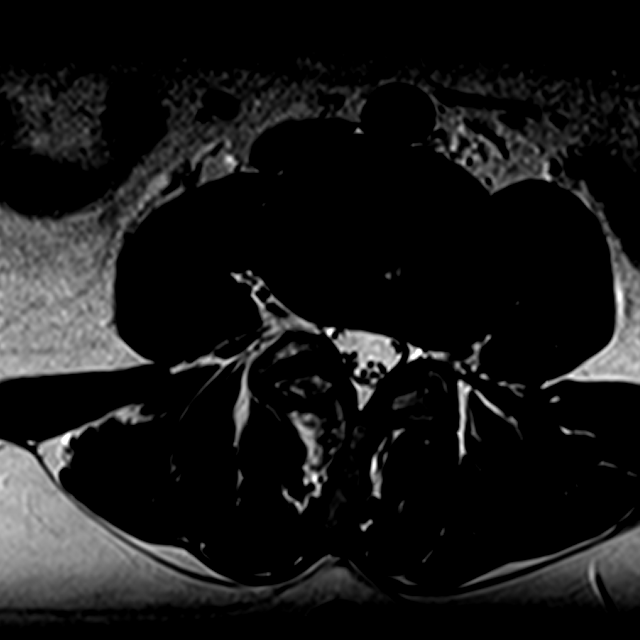
[im 37/43]
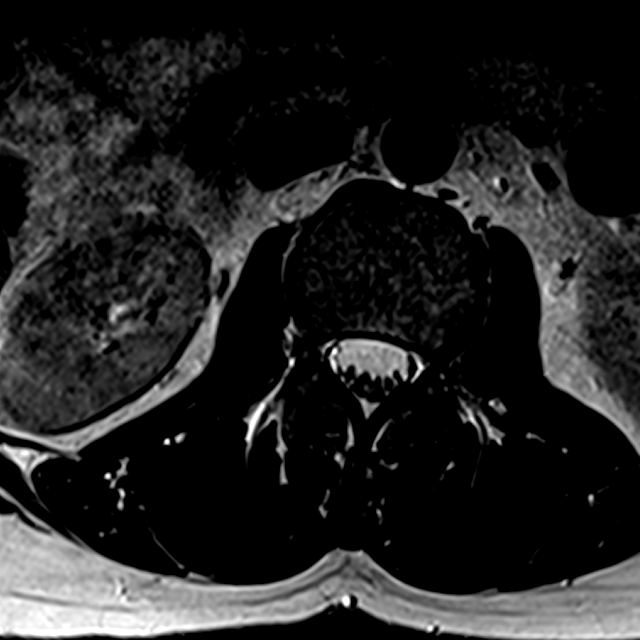

[Series 7: T1 · axial · 4.0mm · 0.22mm/px · z∈[-131,+11]mm · 3 of 40 slices shown (2 of 2)]
[im 7/40]
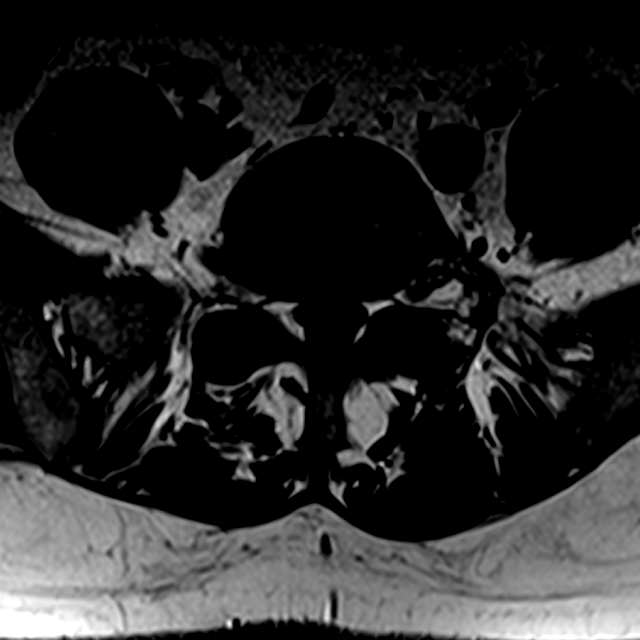
[im 22/40]
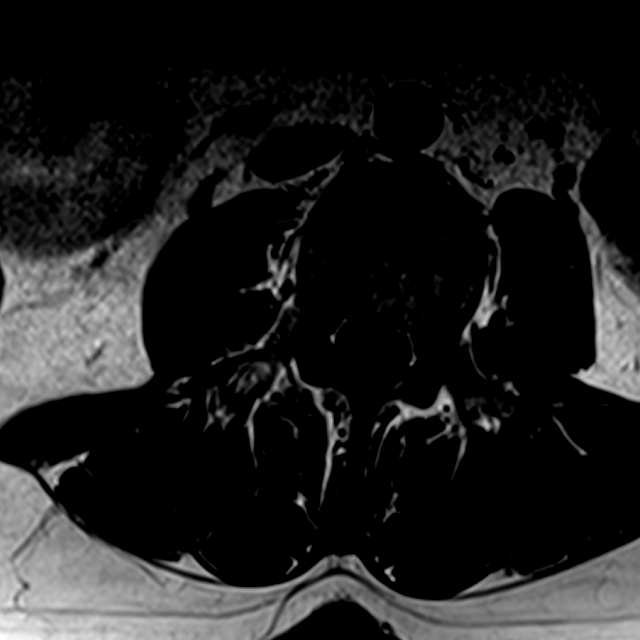
[im 34/40]
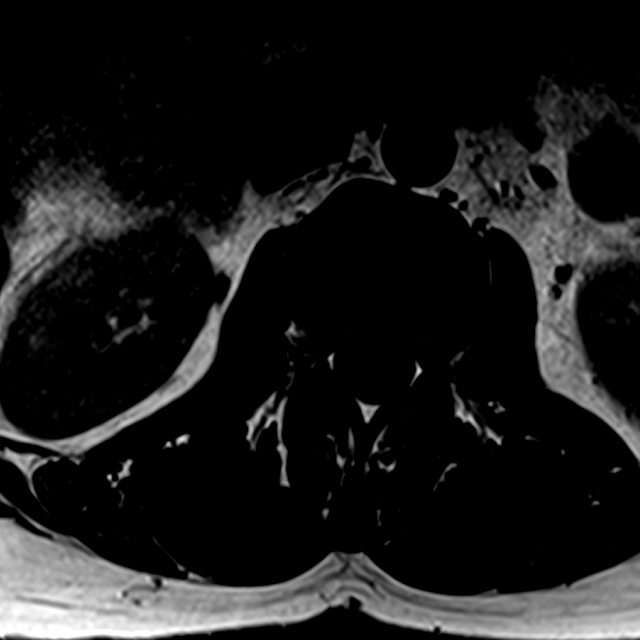

[15 of 48 positions shown; findings below may reference images not displayed]

FINDINGS: Mild levoconvex lumbar scoliosis. Normal lumbar segmentation
demonstrated on the comparison. Mild degenerative appearing endplate
marrow edema anteriorly and eccentric to the left at L3-L4. There is
also marrow edema in the right L3-L4 facet joint which is
degenerated (series 5, image 6). Minimal surrounding soft tissue
inflammation. No other acute osseous abnormality identified.
Negative visualized sacrum and SI joints.

Visualized lower thoracic spinal cord is normal with conus medularis
at T12-L1.

Visualized abdominal viscera are within normal limits. Aside from
around the right L3-L4 facet joint, visualized paraspinal soft
tissues are within normal limits.

T11-T12:  Negative.

T12-L1:  Negative.

L1-L2:  Negative.

L2-L3:  Negative disc.  Mild facet hypertrophy.  No stenosis.

L3-L4: Trace anterolisthesis. Circumferential disc bulge. Moderate
facet hypertrophy. Right side facet joint marrow edema (series 5,
image 6). No spinal or lateral recess stenosis. Mild to moderate
right L3 foraminal stenosis.

L4-L5: Negative disc. Mild to moderate facet and ligament flavum
hypertrophy greater on the left. Mild left L4 foraminal stenosis.

L5-S1:  Negative disc.  Mild facet hypertrophy.
IMPRESSION: 1. Levoconvex lumbar scoliosis. Symptomatic level suspected to be
L3-L4 were there is chronic disc and facet degeneration with mild
right facet joint and anterior endplate marrow edema compatible with
acute exacerbation of the chronic arthritis. There is associated
mild to moderate right L3 foraminal stenosis.
2. Mild lumbar spine degeneration elsewhere.  No spinal stenosis.

## 2016-08-18 DIAGNOSIS — F329 Major depressive disorder, single episode, unspecified: Secondary | ICD-10-CM | POA: Diagnosis not present

## 2016-08-22 DIAGNOSIS — J40 Bronchitis, not specified as acute or chronic: Secondary | ICD-10-CM | POA: Diagnosis not present

## 2016-08-22 DIAGNOSIS — J029 Acute pharyngitis, unspecified: Secondary | ICD-10-CM | POA: Diagnosis not present

## 2016-08-28 ENCOUNTER — Ambulatory Visit (INDEPENDENT_AMBULATORY_CARE_PROVIDER_SITE_OTHER): Payer: Medicare Other | Admitting: Gastroenterology

## 2016-08-28 ENCOUNTER — Encounter: Payer: Self-pay | Admitting: Gastroenterology

## 2016-08-28 VITALS — BP 90/60 | HR 76 | Ht 63.75 in | Wt 162.0 lb

## 2016-08-28 DIAGNOSIS — K219 Gastro-esophageal reflux disease without esophagitis: Secondary | ICD-10-CM

## 2016-08-28 DIAGNOSIS — R1011 Right upper quadrant pain: Secondary | ICD-10-CM | POA: Diagnosis not present

## 2016-08-28 DIAGNOSIS — R140 Abdominal distension (gaseous): Secondary | ICD-10-CM | POA: Diagnosis not present

## 2016-08-28 DIAGNOSIS — K582 Mixed irritable bowel syndrome: Secondary | ICD-10-CM | POA: Diagnosis not present

## 2016-08-28 NOTE — Progress Notes (Signed)
[facility] Progress Note  Chief Complaint: Epigastric pain and bloating  Subjective  History:  Saw AE 12/15 (reviewed note) Neg colon/EGD in 2012 and Jan 2016 nml 2 hr GES 1/16 H pylori neg on Bx  A year of epigastric to RUQ bloating and pain , esp after eating. At night, hurts more to lay on left side Alternating constipation and diarrhea  ROS: Cardiovascular:  no chest pain Respiratory: no dyspnea  The patient's Past Medical, Family and Social History were reviewed and are on file in the EMR. Smoker chole Objective:  Med list reviewed  Vital signs in last 24 hrs: Vitals:   08/28/16 1046  BP: 90/60  Pulse: 76    Physical Exam    HEENT: sclera anicteric, oral mucosa moist without lesions  Neck: supple, no thyromegaly, JVD or lymphadenopathy  Cardiac: RRR without murmurs, S1S2 heard, no peripheral edema  Pulm: clear to auscultation bilaterally, normal RR and effort noted  Abdomen: soft, No tenderness, with active bowel sounds. No guarding or palpable hepatosplenomegaly.  Skin; warm and dry, no jaundice or rash, Multiple tattoos    @ASSESSMENTPLANBEGIN @ Assessment: Encounter Diagnoses  Name Primary?  . Gastroesophageal reflux disease without esophagitis Yes  . Abdominal bloating   . RUQ pain   . Irritable bowel syndrome with both constipation and diarrhea    This patient has a functional bowel disorder. Antispasmodic therapy may not be very helpful because she sometimes has no bowel movement for 5-7 days at a time, which would be made worse by those meds.  Plan:  Dietary advice Are reassured her that her workup appears to been complete at this condition, while aggravating, is not harmful. The chronic nature and treatment of IBS were discussed. The cause is not completely understood, but is likely to be a combination of genetics, diet, stress, visceral hypersensitivity and the gut microbiome.  The available treatments aim to control symptoms even if  unable to "cure" the condition.  While not physically harmful, IBS can have a significant impact on quality of life.  Trial of FD gard   Total time  20 minutes, over half spent in counseling and coordination of care.   [name]

## 2016-08-28 NOTE — Patient Instructions (Addendum)
Food Guidelines for a sensitive stomach  Many people have difficulty digesting certain foods, causing a variety of distressing and embarrassing symptoms such as abdominal pain, bloating and gas.  These foods may need to be avoided or consumed in small amounts.  Here are some tips that might be helpful for you.  1.   Lactose intolerance is the difficulty or complete inability to digest lactose, the natural sugar in milk and anything made from milk.  This condition is harmless, common, and can begin any time during life.  Some people can digest a modest amount of lactose while others cannot tolerate any.  Also, not all dairy products contain equal amounts of lactose.  For example, hard cheeses such as parmesan have less lactose than soft cheeses such as cheddar.  Yogurt has less lactose than milk or cheese.  Many packaged foods (even many brands of bread) have milk, so read ingredient lists carefully.  It is difficult to test for lactose intolerance, so just try avoiding lactose as much as possible for a week and see what happens with your symptoms.  If you seem to be lactose intolerant, the best plan is to avoid it (but make sure you get calcium from another source).  The next best thing is to use lactase enzyme supplements, available over the counter everywhere.  Just know that many lactose intolerant people need to take several tablets with each serving of dairy to avoid symptoms.  Lastly, a lot of restaurant food is made with milk or butter.  Many are things you might not suspect, such as mashed potatoes, rice and pasta (cooked with butter) and "grilled" items.  If you are lactose intolerant, it never hurts to ask your server what has milk or butter.  2.   Fiber is an important part of your diet, but not all fiber is well-tolerated.  Insoluble fiber such as bran is often consumed by normal gut bacteria and converted into gas.  Soluble fiber such as oats, squash, carrots and green beans are typically  tolerated better.  3.   Some types of carbohydrates can be poorly digested.  Examples include: fructose (apples, cherries, pears, raisins and other dried fruits), fructans (onions, zucchini, large amounts of wheat), sorbitol/mannitol/xylitol and sucralose/Splenda (common artificial sweeteners), and raffinose (lentils, broccoli, cabbage, asparagus, brussel sprouts, many types of beans).  Do a Development worker, community for [employer] and you will find helpful information. Beano, a dietary supplement, will often help with raffinose-containing foods.  As with lactase tablets, you may need several per serving.  4.   Whenever possible, avoid processed food&meats and chemical additives.  High fructose corn syrup, a common sweetener, may be difficult to digest.  Eggs and soy (comes from the soybean, and added to many foods now) are the other most common bloating/gassy foods.   Samples of FD gard to see if that helps your upper abd pain and bloating.  Take one before meals.  If you are age 77 or older, your body mass index should be between 23-30. Your Body mass index is 28.03 kg/m. If this is out of the aforementioned range listed, please consider follow up with your Primary Care Provider.  If you are age 45 or younger, your body mass index should be between 19-25. Your Body mass index is 28.03 kg/m. If this is out of the aformentioned range listed, please consider follow up with your Primary Care Provider.   Thank you for choosing [facility]  Dr Wilfrid Lund III

## 2016-09-08 ENCOUNTER — Telehealth: Payer: Self-pay | Admitting: Gastroenterology

## 2016-09-08 NOTE — Telephone Encounter (Signed)
Patient tried FD guard, states it works well but cannot afford this OTC medication. Requesting Protonix, states does not have any more refills of this medication, patient also does not have a primary care physician.

## 2016-09-09 ENCOUNTER — Other Ambulatory Visit: Payer: Self-pay

## 2016-09-09 MED ORDER — PANTOPRAZOLE SODIUM 40 MG PO TBEC: 40.0000 mg | DELAYED_RELEASE_TABLET | Freq: Every day | ORAL | 0 refills | Status: DC

## 2016-09-09 NOTE — Telephone Encounter (Signed)
Called patient back, she changed what she was asking for. Wanted to know about the FD guard. I explained that when we spoke last week, she stated that she could not afford this OTC medication and requested an Rx called into her pharmacy for protonix, as her copay is only $3.00. Explained that Dr. Loletha Carrow, as she requested, did send an Rx for one month's supply of protonix, then she may use OTC omeprazole 20 mg daily after that. Patient prefers and will figure out how to pay for the FD guard.

## 2016-09-09 NOTE — Telephone Encounter (Signed)
It is not clear to me that this is really a stomach acid-related problem.  That is why I recommended the FD gard. If she feels strongly that protonix helps her feel much better, then she can have protonix 40 mg once daily for one month, no refill.  She can get  OTC omeprazole 20 mg once daily after that

## 2016-09-15 ENCOUNTER — Telehealth: Payer: Self-pay | Admitting: Gastroenterology

## 2016-09-15 NOTE — Telephone Encounter (Signed)
Patient called back, she continues to struggle with IBS symptoms of abdominal pain, constipation and diarrhea. She states that the [place] gard worked well but she cannot afford this OTC medication. Last phone note she takes protonix but is requesting a prescription to help with other symptoms. Patient is on a fixed income and cannot afford any over the counter medication. Please advise, thank you.

## 2016-09-16 NOTE — Telephone Encounter (Signed)
I am sorry to hear she is not feeling well.  I am afraid I do not have other prescription meds that would be helpful.

## 2016-09-16 NOTE — Telephone Encounter (Signed)
Called patient back, had to leave a voice message stating that unfortunately Dr. Loletha Carrow does not have other prescription medications that he feels would be helpful. If she has questions or other concerns she can call back or make a follow up visit.

## 2016-10-15 ENCOUNTER — Other Ambulatory Visit: Payer: Self-pay | Admitting: Gastroenterology

## 2016-10-22 DIAGNOSIS — Z23 Encounter for immunization: Secondary | ICD-10-CM | POA: Diagnosis not present

## 2016-10-22 DIAGNOSIS — F411 Generalized anxiety disorder: Secondary | ICD-10-CM | POA: Diagnosis not present

## 2016-10-22 DIAGNOSIS — E785 Hyperlipidemia, unspecified: Secondary | ICD-10-CM | POA: Diagnosis not present

## 2016-10-22 DIAGNOSIS — R21 Rash and other nonspecific skin eruption: Secondary | ICD-10-CM | POA: Diagnosis not present

## 2016-10-22 DIAGNOSIS — F17200 Nicotine dependence, unspecified, uncomplicated: Secondary | ICD-10-CM | POA: Diagnosis not present

## 2016-10-22 DIAGNOSIS — K219 Gastro-esophageal reflux disease without esophagitis: Secondary | ICD-10-CM | POA: Diagnosis not present

## 2016-10-22 DIAGNOSIS — G4700 Insomnia, unspecified: Secondary | ICD-10-CM | POA: Diagnosis not present

## 2016-10-22 DIAGNOSIS — R32 Unspecified urinary incontinence: Secondary | ICD-10-CM | POA: Diagnosis not present

## 2016-10-22 DIAGNOSIS — F339 Major depressive disorder, recurrent, unspecified: Secondary | ICD-10-CM | POA: Diagnosis not present

## 2016-10-22 DIAGNOSIS — B359 Dermatophytosis, unspecified: Secondary | ICD-10-CM | POA: Diagnosis not present

## 2016-10-24 DIAGNOSIS — E785 Hyperlipidemia, unspecified: Secondary | ICD-10-CM | POA: Diagnosis not present

## 2016-10-24 DIAGNOSIS — M79674 Pain in right toe(s): Secondary | ICD-10-CM | POA: Diagnosis not present

## 2016-11-14 DIAGNOSIS — F329 Major depressive disorder, single episode, unspecified: Secondary | ICD-10-CM | POA: Diagnosis not present

## 2016-12-02 DIAGNOSIS — L03031 Cellulitis of right toe: Secondary | ICD-10-CM | POA: Diagnosis not present

## 2016-12-02 DIAGNOSIS — M79674 Pain in right toe(s): Secondary | ICD-10-CM | POA: Diagnosis not present

## 2016-12-25 DIAGNOSIS — E785 Hyperlipidemia, unspecified: Secondary | ICD-10-CM | POA: Diagnosis not present

## 2016-12-25 DIAGNOSIS — K219 Gastro-esophageal reflux disease without esophagitis: Secondary | ICD-10-CM | POA: Diagnosis not present

## 2016-12-25 DIAGNOSIS — J019 Acute sinusitis, unspecified: Secondary | ICD-10-CM | POA: Diagnosis not present

## 2016-12-25 DIAGNOSIS — F411 Generalized anxiety disorder: Secondary | ICD-10-CM | POA: Diagnosis not present

## 2016-12-25 DIAGNOSIS — F339 Major depressive disorder, recurrent, unspecified: Secondary | ICD-10-CM | POA: Diagnosis not present

## 2016-12-31 DIAGNOSIS — G8929 Other chronic pain: Secondary | ICD-10-CM | POA: Diagnosis not present

## 2016-12-31 DIAGNOSIS — M545 Low back pain: Secondary | ICD-10-CM | POA: Diagnosis not present

## 2016-12-31 DIAGNOSIS — M542 Cervicalgia: Secondary | ICD-10-CM | POA: Diagnosis not present

## 2017-01-10 DIAGNOSIS — M542 Cervicalgia: Secondary | ICD-10-CM | POA: Diagnosis not present

## 2017-01-16 DIAGNOSIS — R070 Pain in throat: Secondary | ICD-10-CM | POA: Diagnosis not present

## 2017-01-16 DIAGNOSIS — J302 Other seasonal allergic rhinitis: Secondary | ICD-10-CM | POA: Diagnosis not present

## 2017-01-19 DIAGNOSIS — M542 Cervicalgia: Secondary | ICD-10-CM | POA: Diagnosis not present

## 2017-01-26 DIAGNOSIS — M542 Cervicalgia: Secondary | ICD-10-CM | POA: Diagnosis not present

## 2017-01-30 ENCOUNTER — Encounter: Payer: Self-pay | Admitting: Physical Therapy

## 2017-01-30 ENCOUNTER — Ambulatory Visit: Payer: Medicare Other | Attending: Orthopedic Surgery | Admitting: Physical Therapy

## 2017-01-30 DIAGNOSIS — M6281 Muscle weakness (generalized): Secondary | ICD-10-CM

## 2017-01-30 DIAGNOSIS — M542 Cervicalgia: Secondary | ICD-10-CM | POA: Insufficient documentation

## 2017-01-30 DIAGNOSIS — R2689 Other abnormalities of gait and mobility: Secondary | ICD-10-CM | POA: Insufficient documentation

## 2017-01-30 DIAGNOSIS — G8929 Other chronic pain: Secondary | ICD-10-CM | POA: Insufficient documentation

## 2017-01-30 NOTE — Patient Instructions (Signed)
  Reminder for posture correction when sitting    Press head down, hold 5-10 seconds, repeat 10 times, 2 times a day      Stand against the wall, feet shoulder width apart (feet will be away from the wall), hips against wall, shoulders against wall, place elbows against the wall and lift arms above your head trying to keep your elbows against the wall.  Repeat 10 times Twice a day

## 2017-01-30 NOTE — Therapy (Signed)
[facility] [place], [place], [address] Phone: [phone]   Fax:  [phone]  Physical Therapy Evaluation  Patient Details  Name: Margaret Barker MRN: [id number] Date of Birth: 1971-06-23 Referring Provider: Dr. Melina Schools  Encounter Date: 01/30/2017      PT End of Session - 01/30/17 0919    Visit Number 1   Number of Visits 12   Authorization Type Medicare/Medicaid KX modifier at 15th visit   PT Start Time 0905   PT Stop Time 0950   PT Time Calculation (min) 45 min   Activity Tolerance Patient tolerated treatment well   Behavior During Therapy [facility] for tasks assessed/performed      Past Medical History:  Diagnosis Date  . Alcoholism ([place])   . Anxiety   . Arthritis    stenosis & scoliosis of lumbar area    . Bipolar disorder ([place])   . Depression   . Elevated liver enzymes 2015   pt. reports as of last check, liver enymes much better   . Erosion of vaginal wall due to surgical mesh ([place])   . Esophagitis   . Frequency of urination   . GERD (gastroesophageal reflux disease)   . Helicobacter pylori gastritis    DX  04-13-2014-- TREATED WITH ANTBIOTICS  . History of panic attacks   . Hyperlipidemia   . Nocturia   . Panic attacks   . SUI (stress urinary incontinence, female)   . Wears glasses     Past Surgical History:  Procedure Laterality Date  . ABDOMINAL HYSTERECTOMY    . CHOLECYSTECTOMY  1992  . COLONOSCOPY N/A 10/23/2014   Procedure: COLONOSCOPY;  Surgeon: Inda Castle, MD;  Location: [facility];  Service: Endoscopy;  Laterality: N/A;  . CYSTOSCOPY N/A 04/24/2014   Procedure: CYSTOSCOPY;  Surgeon: Ailene Rud, MD;  Location: [facility];  Service: Urology;  Laterality: N/A;  . D & C HYSTEROSCOPY W/ POLYP REMOVAL  04-22-2006  . ESOPHAGOGASTRODUODENOSCOPY (EGD) WITH PROPOFOL N/A 10/23/2014   Procedure: ESOPHAGOGASTRODUODENOSCOPY (EGD) WITH PROPOFOL;  Surgeon: Inda Castle, MD;  Location: [facility];  Service: Endoscopy;  Laterality: N/A;  . LAPAROSCOPIC ASSISTED VAGINAL HYSTERECTOMY  10-22-2006  . LEFT BREAST EXCISIONAL BX  03-26-2007   BENIGN  . LESION REMOVAL  07/09/2012   Procedure: EXCISION VAGINAL LESION;  Surgeon: Ailene Rud, MD;  Location[facility];  Service: Urology;  Laterality: N/A;  Excision of Vaginal apical cyst  . PUBOVAGINAL SLING N/A 04/24/2014   Procedure: EXCISE OF VAGINAL APICAL MESH EXTRUSION;  Surgeon: Ailene Rud, MD;  Location: [facility];  Service: Urology;  Laterality: N/A;  . RIGHT EXCISIONAL BREAST BX  10-23-2005   BENIGN  . SACROILIAC JOINT FUSION Right 12/20/2015   Procedure: RIGHT SACROILIAC  FUSION;  Surgeon: Melina Schools, MD;  Location: [place];  Service: Orthopedics;  Laterality: Right;  . SACROSPINUS ANTERIOR CULPOSUSPENSION WITH UPHOLD MESH AND SOLYX SINGLE INCISION TRANSURETHRAL SLING  08-09-2009 DR TANNENBAUM   STRESS INCONTINENCE W/ PELVIC FLOOR PROLAPSE  . TUBAL LIGATION      There were no vitals filed for this visit.       Subjective Assessment - 01/30/17 0913    Subjective Pt arriving to therapy reporting 6/10 neck pain which spreads to bilateral shoulders. Pt also reporting popping. Pt reporting difficulty with some ADL's, mopping, lawn work, and other household chores. Pt also reporting diziness when bending forward and looking up.  Limitations House hold activities;Sitting   How long can you sit comfortably? 5 minutes   How long can you stand comfortably? 5 minutes   How long can you walk comfortably? not able to walk comfortably   Diagnostic tests X-ray, MRI   Patient Stated Goals Stop hurting, Be able to do physical activity without hurting   Currently in Pain? Yes   Pain Score 6    Pain Location Neck   Pain Orientation Mid   Pain Descriptors / Indicators Aching;Burning;Throbbing   Pain Type Chronic pain   Pain Onset More than a month ago    Pain Frequency Constant   Aggravating Factors  moving, ADL's,    Pain Relieving Factors heat, ice, over the counter pain meds, changing positions   Effect of Pain on Daily Activities sleeping difficuly, difficulty with ADL's and household activites            [facility] PT Assessment - 01/30/17 0001      Assessment   Medical Diagnosis Neck pain   Referring Provider Dr. Melina Schools   Hand Dominance Right   Next MD Visit 02/02/17   Prior Therapy yes, for low back pain     Precautions   Precautions None     Balance Screen   Has the patient fallen in the past 6 months Yes   How many times? [place] residence   Living Arrangements Spouse/significant other   Available Help at Discharge Family   Type of [place] to enter   Entrance Stairs-Number of Steps 5   Entrance Stairs-Rails Can reach both   [place] One level     Prior Function   Level of [place] On disability   Leisure crochet     Cognition   Overall Cognitive Status Within Functional Limits for tasks assessed     Observation/Other Assessments   Focus on Therapeutic Outcomes (FOTO)  54% limitation     Coordination   Fine Motor Movements are Fluid and Coordinated Yes   Finger Nose Finger Test intact     Posture/Postural Control   Posture/Postural Control Postural limitations   Postural Limitations Rounded Shoulders;Forward head   Posture Comments mild scoliolis     ROM / Strength   AROM / PROM / Strength AROM;Strength     AROM   AROM Assessment Site Cervical   Cervical Flexion 50   Cervical Extension 55   Cervical - Right Side Bend 30   Cervical - Left Side Bend 38   Cervical - Right Rotation 65   Cervical - Left Rotation 60     Strength   Overall Strength Comments Bilateral shoulder abduction and  flexion grossly 4/5                   [facility] Adult PT Treatment/Exercise - 01/30/17 0001       Exercises   Exercises Neck     Neck Exercises: Standing   Other Standing Exercises Wall Angels     Neck Exercises: Supine   Cervical Isometrics Extension;Right lateral flexion;Left lateral flexion;5 secs;5 reps   Neck Retraction 10 reps;10 secs     Modalities   Modalities Electrical Stimulation;Moist Heat     Moist Heat Therapy   Number Minutes Moist Heat 15 Minutes   Moist Heat Location Cervical     Electrical Stimulation   Electrical Stimulation Location cervical paraspinals   Electrical  Stimulation Action IFC   Electrical Stimulation Parameters 80-150 Hz x 15 minutes, intensity to pt's tolerance   Electrical Stimulation Goals Pain                PT Education - 01/30/17 0918    Education provided Yes   Education Details HEP, posture correction   Person(s) Educated Patient   Methods Explanation;Demonstration;Handout   Comprehension Verbalized understanding;Returned demonstration          PT Short Term Goals - 01/30/17 1101      PT SHORT TERM GOAL #1   Title I with initial HEP.    Time 3   Period Weeks   Status New           PT Long Term Goals - 01/30/17 1102      PT LONG TERM GOAL #1   Title Pt will increase her FOTO score from 54% limitation to </= 44% limitation in order to improve functional mobility.    Time 6   Period Weeks   Status New     PT LONG TERM GOAL #2   Title Pt will improve bilateral shoulder flexion strength  to 5/5 in order to improve function.    Time 6   Period Weeks   Status New     PT LONG TERM GOAL #3   Title Pt will be able to report pain </= 2/10 with ADL's.    Time 6   Period Weeks   Status New     PT LONG TERM GOAL #4   Title Patient able to demonstrate sitting posture correction and progressive HEP independently.    Time 6   Period Weeks   Status New               Plan - 01/30/17 1610    Clinical Impression Statement Patient arriving to therapy today as a low complexity evaluation complaining of  neck pain which extends into bilateral upper traps. Pt reporting 6/10 pain at beginning of session and 4/10 pain at end of session. Pt presenting with decreased strength in bilateral UE's due to neck pain. Pt was instructed in posture correction and cervical retraction exercises as well as wall angels. E-stim also applied to cervical paraspinals. Skiled therapy needed to address pt's impairments with the below interventions to allow pt to return to her PLOF.     Rehab Potential Good   PT Frequency 2x / week   PT Duration 6 weeks   PT Treatment/Interventions Patient/family education;Functional mobility training;Therapeutic activities;Therapeutic exercise;ADLs/Self Care Home Management;Taping;Dry needling;Traction;Moist Heat;Electrical Stimulation;Cryotherapy;Manual techniques;Passive range of motion;Ultrasound   PT Next Visit Plan Cervical ROM, shoulder ROM/strengthening   PT Home Exercise Plan cervical retraction and wall angels   Consulted and Agree with Plan of Care Patient      Patient will benefit from skilled therapeutic intervention in order to improve the following deficits and impairments:  Pain, Impaired UE functional use, Decreased range of motion, Decreased activity tolerance, Decreased strength  Visit Diagnosis: Chronic neck pain - Plan: PT plan of care cert/re-cert  Muscle weakness (generalized) - Plan: PT plan of care cert/re-cert      G-Codes - 96/04/54 1106    Functional Assessment Tool Used (Outpatient Only) FOTO, clinical assessment/judgement   Functional Limitation Carrying, moving and handling objects   Carrying, Moving and Handling Objects Current Status (U9811) At least 40 percent but less than 60 percent impaired, limited or restricted   Carrying, Moving and Handling Objects Goal Status (B1478) At least 40  percent but less than 60 percent impaired, limited or restricted       Problem List Patient Active Problem List   Diagnosis Date Noted  . Depression  03/19/2016  . Hyperlipemia 03/19/2016  . Complicated grieving 49/82/6415  . SI (sacroiliac) pain 12/20/2015  . GAD (generalized anxiety disorder) 07/20/2015  . Insomnia 07/20/2015  . Prolapse of vaginal wall with midline cystocele 04/24/2014  . MIXED INCONTINENCE URGE AND STRESS 03/21/2009  . PANIC ATTACKS 12/10/2006  . POST TRAUMATIC STRESS DISORDER 12/10/2006  . GASTROESOPHAGEAL REFLUX, NO ESOPHAGITIS 12/10/2006    Oretha Caprice, MPT 01/30/2017, 11:14 AM  [facility] [place], [place], [address] Phone: [phone]   Fax:  [phone]  Name: Margaret Barker MRN: [id number] Date of Birth: June 18, 1971

## 2017-02-02 DIAGNOSIS — G8929 Other chronic pain: Secondary | ICD-10-CM | POA: Diagnosis not present

## 2017-02-02 DIAGNOSIS — M542 Cervicalgia: Secondary | ICD-10-CM | POA: Diagnosis not present

## 2017-02-02 DIAGNOSIS — M533 Sacrococcygeal disorders, not elsewhere classified: Secondary | ICD-10-CM | POA: Diagnosis not present

## 2017-02-02 DIAGNOSIS — M5441 Lumbago with sciatica, right side: Secondary | ICD-10-CM | POA: Diagnosis not present

## 2017-02-03 ENCOUNTER — Encounter: Payer: Self-pay | Admitting: Physical Therapy

## 2017-02-03 ENCOUNTER — Ambulatory Visit: Payer: Medicare Other | Admitting: Physical Therapy

## 2017-02-03 DIAGNOSIS — R2689 Other abnormalities of gait and mobility: Secondary | ICD-10-CM

## 2017-02-03 DIAGNOSIS — G8929 Other chronic pain: Secondary | ICD-10-CM

## 2017-02-03 DIAGNOSIS — M6281 Muscle weakness (generalized): Secondary | ICD-10-CM | POA: Diagnosis not present

## 2017-02-03 DIAGNOSIS — M542 Cervicalgia: Secondary | ICD-10-CM | POA: Diagnosis not present

## 2017-02-03 NOTE — Therapy (Signed)
[facility] [place], [place], [address] Phone: [phone]   Fax:  [phone]  Physical Therapy Treatment  Patient Details  Name: Margaret Barker MRN: [id number] Date of Birth: 1971-08-15 Referring Provider: Dr Melina Schools  Encounter Date: 02/03/2017      PT End of Session - 02/03/17 1243    Visit Number 2   Number of Visits 12   Authorization Type Medicare/Medicaid KX modifier at 15th visit   PT Start Time 1230   PT Stop Time 1300   PT Time Calculation (min) 30 min      Past Medical History:  Diagnosis Date  . Alcoholism ([place])   . Anxiety   . Arthritis    stenosis & scoliosis of lumbar area    . Bipolar disorder ([place])   . Depression   . Elevated liver enzymes 2015   pt. reports as of last check, liver enymes much better   . Erosion of vaginal wall due to surgical mesh ([place])   . Esophagitis   . Frequency of urination   . GERD (gastroesophageal reflux disease)   . Helicobacter pylori gastritis    DX  04-13-2014-- TREATED WITH ANTBIOTICS  . History of panic attacks   . Hyperlipidemia   . Nocturia   . Panic attacks   . SUI (stress urinary incontinence, female)   . Wears glasses     Past Surgical History:  Procedure Laterality Date  . ABDOMINAL HYSTERECTOMY    . CHOLECYSTECTOMY  1992  . COLONOSCOPY N/A 10/23/2014   Procedure: COLONOSCOPY;  Surgeon: Inda Castle, MD;  [facility]: [facility];  Service: Endoscopy;  Laterality: N/A;  . CYSTOSCOPY N/A 04/24/2014   Procedure: CYSTOSCOPY;  Surgeon: Ailene Rud, MD;  Location: [facility];  Service: Urology;  Laterality: N/A;  . D & C HYSTEROSCOPY W/ POLYP REMOVAL  04-22-2006  . ESOPHAGOGASTRODUODENOSCOPY (EGD) WITH PROPOFOL N/A 10/23/2014   Procedure: ESOPHAGOGASTRODUODENOSCOPY (EGD) WITH PROPOFOL;  Surgeon: Inda Castle, MD;  Location: [facility];  Service: Endoscopy;  Laterality: N/A;  . LAPAROSCOPIC ASSISTED VAGINAL HYSTERECTOMY   10-22-2006  . LEFT BREAST EXCISIONAL BX  03-26-2007   BENIGN  . LESION REMOVAL  07/09/2012   Procedure: EXCISION VAGINAL LESION;  Surgeon: Ailene Rud, MD;  Location: [facility];  Service: Urology;  Laterality: N/A;  Excision of Vaginal apical cyst  . PUBOVAGINAL SLING N/A 04/24/2014   Procedure: EXCISE OF VAGINAL APICAL MESH EXTRUSION;  Surgeon: Ailene Rud, MD;  Location: [facility];  Service: Urology;  Laterality: N/A;  . RIGHT EXCISIONAL BREAST BX  10-23-2005   BENIGN  . SACROILIAC JOINT FUSION Right 12/20/2015   Procedure: RIGHT SACROILIAC  FUSION;  Surgeon: Melina Schools, MD;  Location: [place];  Service: Orthopedics;  Laterality: Right;  . SACROSPINUS ANTERIOR CULPOSUSPENSION WITH UPHOLD MESH AND SOLYX SINGLE INCISION TRANSURETHRAL SLING  08-09-2009 DR TANNENBAUM   STRESS INCONTINENCE W/ PELVIC FLOOR PROLAPSE  . TUBAL LIGATION      There were no vitals filed for this visit.      Subjective Assessment - 02/03/17 1235    Subjective Pt arriving to therapy reporting 5/10 neck pain which is radiating down R shoulder more than left today.    Limitations House hold activities;Sitting   How long can you sit comfortably? 5 minutes   How long can you stand comfortably? 5 minutes   How long can you walk comfortably? not able to walk comfortably  Patient Stated Goals Stop hurting, Be able to do physical activity without hurting   Currently in Pain? Yes   Pain Score 5    Pain Location Neck   Pain Orientation Mid   Pain Descriptors / Indicators Aching;Burning   Pain Type Chronic pain   Pain Onset More than a month ago   Pain Frequency Constant   Aggravating Factors  moving, ADL's   Pain Relieving Factors heat, ice, over the counter pain meds, changing positions   Effect of Pain on Daily Activities sleeping difficulty, and difficutly with ADL's, and household activiteis            [facility] PT Assessment - 02/03/17 0001      Assessment    Medical Diagnosis Neck pain   Referring Provider Dr Melina Schools   Hand Dominance Right   Next MD Visit 02/02/17   Prior Therapy yes, for low back pain     Precautions   Precautions None     Balance Screen   Has the patient fallen in the past 6 months Yes   How many times? 1                     [facility] Adult PT Treatment/Exercise - 02/03/17 0001      Posture/Postural Control   Posture/Postural Control Postural limitations   Postural Limitations Rounded Shoulders;Forward head   Posture Comments mild scoliolis     Exercises   Exercises Neck     Neck Exercises: Standing   Neck Retraction 10 reps   Other Standing Exercises Wall Angels   Other Standing Exercises Rows using green theraband 10 reps x 2 sets     Neck Exercises: Seated   Other Seated Exercise upper trap/levator stretch x 5 to each side holding 20 seconds each     Modalities   Modalities Cryotherapy;Moist Heat     Moist Heat Therapy   Number Minutes Moist Heat 15 Minutes   Moist Heat Location Cervical     Electrical Stimulation   Electrical Stimulation Location cervical paraspinals   Electrical Stimulation Action IFC   Electrical Stimulation Parameters 80-150 Hz x 15 minutes, intenity to pt's tolerance   Electrical Stimulation Goals Pain                PT Education - 02/03/17 1241    Education provided Yes   Education Details HEP review, rows   Person(s) Educated Patient   Methods Explanation;Demonstration   Comprehension Verbalized understanding;Returned demonstration          PT Short Term Goals - 01/30/17 1101      PT SHORT TERM GOAL #1   Title I with initial HEP.    Time 3   Period Weeks   Status New           PT Long Term Goals - 02/03/17 1248      PT LONG TERM GOAL #1   Title Pt will increase her FOTO score from 54% limitation to </= 44% limitation in order to improve functional mobility.    Time 6   Status New     PT LONG TERM GOAL #2   Title Pt will  improve bilateral shoulder flexion strength  to 5/5 in order to improve function.    Time 6   Period Weeks   Status New     PT LONG TERM GOAL #3   Title Pt will be able to report pain </= 2/10 with ADL's.    Time  6   Period Weeks   Status New     PT LONG TERM GOAL #4   Title Patient able to demonstrate sitting posture correction and progressive HEP independently.    Time 6   Period Weeks   Status New               Plan - 02/03/17 1247    Clinical Impression Statement Patient arriving to therpay today reporting 5/10 neck pain radiating into R shoulder. Pt tolerated ther exercises well and was issued green theraband for HEP continuation. Pt ending session with 2/10 neck pain. Pt tolerating E-stim well with skin intact following session.    Rehab Potential Good   PT Frequency 2x / week   PT Duration 6 weeks   PT Treatment/Interventions Patient/family education;Functional mobility training;Therapeutic activities;Therapeutic exercise;ADLs/Self Care Home Management;Taping;Dry needling;Traction;Moist Heat;Electrical Stimulation;Cryotherapy;Manual techniques;Passive range of motion;Ultrasound   PT Next Visit Plan Cervical ROM, shoulder ROM/strengthening   PT Home Exercise Plan cervical retraction and wall angels   Consulted and Agree with Plan of Care Patient      Patient will benefit from skilled therapeutic intervention in order to improve the following deficits and impairments:  Pain, Impaired UE functional use, Decreased range of motion, Decreased activity tolerance, Decreased strength  Visit Diagnosis: Chronic neck pain  Muscle weakness (generalized)  Other abnormalities of gait and mobility     Problem List Patient Active Problem List   Diagnosis Date Noted  . Depression 03/19/2016  . Hyperlipemia 03/19/2016  . Complicated grieving 29/11/1113  . SI (sacroiliac) pain 12/20/2015  . GAD (generalized anxiety disorder) 07/20/2015  . Insomnia 07/20/2015  . Prolapse  of vaginal wall with midline cystocele 04/24/2014  . MIXED INCONTINENCE URGE AND STRESS 03/21/2009  . PANIC ATTACKS 12/10/2006  . POST TRAUMATIC STRESS DISORDER 12/10/2006  . GASTROESOPHAGEAL REFLUX, NO ESOPHAGITIS 12/10/2006    Oretha Caprice, MPT  02/03/2017, 1:23 PM  [facility] [place], [place], [address] Phone: [phone]   Fax:  [phone]  Name: Margaret Barker MRN: [id number] Date of Birth: [birth year]

## 2017-02-06 ENCOUNTER — Ambulatory Visit: Payer: Medicare Other | Admitting: *Deleted

## 2017-02-06 DIAGNOSIS — M542 Cervicalgia: Secondary | ICD-10-CM | POA: Diagnosis not present

## 2017-02-06 DIAGNOSIS — R2689 Other abnormalities of gait and mobility: Secondary | ICD-10-CM

## 2017-02-06 DIAGNOSIS — G8929 Other chronic pain: Secondary | ICD-10-CM

## 2017-02-06 DIAGNOSIS — M6281 Muscle weakness (generalized): Secondary | ICD-10-CM

## 2017-02-06 NOTE — Therapy (Signed)
[facility] [place], [place], [address] Phone: [phone]   Fax:  [phone]  Physical Therapy Treatment  Patient Details  Name: Margaret Barker MRN: [id number] Date of Birth: 21-Jan-1971 Referring Provider: Dr Melina Schools  Encounter Date: 02/06/2017      PT End of Session - 02/06/17 0906    Visit Number 3   Number of Visits 12   Authorization Type Medicare/Medicaid KX modifier at 15th visit   PT Start Time 0900   PT Stop Time 0951   PT Time Calculation (min) 51 min      Past Medical History:  Diagnosis Date  . Alcoholism ([place])   . Anxiety   . Arthritis    stenosis & scoliosis of lumbar area    . Bipolar disorder ([place])   . Depression   . Elevated liver enzymes 2015   pt. reports as of last check, liver enymes much better   . Erosion of vaginal wall due to surgical mesh ([place])   . Esophagitis   . Frequency of urination   . GERD (gastroesophageal reflux disease)   . Helicobacter pylori gastritis    DX  04-13-2014-- TREATED WITH ANTBIOTICS  . History of panic attacks   . Hyperlipidemia   . Nocturia   . Panic attacks   . SUI (stress urinary incontinence, female)   . Wears glasses     Past Surgical History:  Procedure Laterality Date  . ABDOMINAL HYSTERECTOMY    . CHOLECYSTECTOMY  1992  . COLONOSCOPY N/A 10/23/2014   Procedure: COLONOSCOPY;  Surgeon: Inda Castle, MD;  Location: [facility];  Service: Endoscopy;  Laterality: N/A;  . CYSTOSCOPY N/A 04/24/2014   Procedure: CYSTOSCOPY;  Surgeon: Ailene Rud, MD;  Location: [facility];  Service: Urology;  Laterality: N/A;  . D & C HYSTEROSCOPY W/ POLYP REMOVAL  04-22-2006  . ESOPHAGOGASTRODUODENOSCOPY (EGD) WITH PROPOFOL N/A 10/23/2014   Procedure: ESOPHAGOGASTRODUODENOSCOPY (EGD) WITH PROPOFOL;  Surgeon: Inda Castle, MD;  [facility];  Service: Endoscopy;  Laterality: N/A;  . LAPAROSCOPIC ASSISTED VAGINAL HYSTERECTOMY   10-22-2006  . LEFT BREAST EXCISIONAL BX  03-26-2007   BENIGN  . LESION REMOVAL  07/09/2012   Procedure: EXCISION VAGINAL LESION;  Surgeon: Ailene Rud, MD;  Location: [facility];  Service: Urology;  Laterality: N/A;  Excision of Vaginal apical cyst  . PUBOVAGINAL SLING N/A 04/24/2014   Procedure: EXCISE OF VAGINAL APICAL MESH EXTRUSION;  Surgeon: Ailene Rud, MD;  Location: [facility];  Service: Urology;  Laterality: N/A;  . RIGHT EXCISIONAL BREAST BX  10-23-2005   BENIGN  . SACROILIAC JOINT FUSION Right 12/20/2015   Procedure: RIGHT SACROILIAC  FUSION;  Surgeon: Melina Schools, MD;  Location: [facility];  Service: Orthopedics;  Laterality: Right;  . SACROSPINUS ANTERIOR CULPOSUSPENSION WITH UPHOLD MESH AND SOLYX SINGLE INCISION TRANSURETHRAL SLING  08-09-2009 DR TANNENBAUM   STRESS INCONTINENCE W/ PELVIC FLOOR PROLAPSE  . TUBAL LIGATION      There were no vitals filed for this visit.      Subjective Assessment - 02/06/17 0905    Subjective Pt arriving to therapy reporting 5/10 neck pain which is radiating down R shoulder more than left today.    Limitations House hold activities;Sitting   How long can you sit comfortably? 5 minutes   How long can you stand comfortably? 5 minutes   How long can you walk comfortably? not able to walk comfortably  Diagnostic tests X-ray, MRI   Patient Stated Goals Stop hurting, Be able to do physical activity without hurting   Currently in Pain? Yes   Pain Score 5    Pain Location Neck   Pain Descriptors / Indicators Aching;Burning   Pain Type Chronic pain   Pain Onset More than a month ago   Pain Frequency Constant                         [facility] Adult PT Treatment/Exercise - 02/06/17 0001      Exercises   Exercises Neck     Neck Exercises: Standing   Neck Retraction 20 reps  verbal cues for technique   Other Standing Exercises Wall Angels   Other Standing Exercises Rows using green  theraband 10 reps x 2 sets     Modalities   Modalities Cryotherapy;Moist Heat;Ultrasound;Traction     Ultrasound   Ultrasound Location RT neck and UT   Ultrasound Parameters 1.5 w/cm2 x10 mins in sitting   Ultrasound Goals Pain     Traction   Type of Traction Cervical   Min (lbs) 5   Max (lbs) 15   Hold Time 99   Rest Time 5   Time 15                  PT Short Term Goals - 01/30/17 1101      PT SHORT TERM GOAL #1   Title I with initial HEP.    Time 3   Period Weeks   Status New           PT Long Term Goals - 02/03/17 1248      PT LONG TERM GOAL #1   Title Pt will increase her FOTO score from 54% limitation to </= 44% limitation in order to improve functional mobility.    Time 6   Status New     PT LONG TERM GOAL #2   Title Pt will improve bilateral shoulder flexion strength  to 5/5 in order to improve function.    Time 6   Period Weeks   Status New     PT LONG TERM GOAL #3   Title Pt will be able to report pain </= 2/10 with ADL's.    Time 6   Period Weeks   Status New     PT LONG TERM GOAL #4   Title Patient able to demonstrate sitting posture correction and progressive HEP independently.    Time 6   Period Weeks   Status New               Plan - 02/06/17 0907    Clinical Impression Statement Pt arrived to clinic doing fairly well. Her neck pain was 4-5/10  and mainly RT side. Pt did great with Rx today and responded well with [place] and cervical traction. Traction was performed at 15#s   Rehab Potential Good   PT Frequency 2x / week   PT Duration 6 weeks   PT Treatment/Interventions Patient/family education;Functional mobility training;Therapeutic activities;Therapeutic exercise;ADLs/Self Care Home Management;Taping;Dry needling;Traction;Moist Heat;Electrical Stimulation;Cryotherapy;Manual techniques;Passive range of motion;Ultrasound   PT Next Visit Plan Cervical ROM, shoulder ROM/strengthening, Assess traction   PT Home Exercise Plan  cervical retraction and wall angels   Consulted and Agree with Plan of Care Patient      Patient will benefit from skilled therapeutic intervention in order to improve the following deficits and impairments:  Pain, Impaired UE functional use,  Decreased range of motion, Decreased activity tolerance, Decreased strength  Visit Diagnosis: Chronic neck pain  Muscle weakness (generalized)  Other abnormalities of gait and mobility     Problem List Patient Active Problem List   Diagnosis Date Noted  . Depression 03/19/2016  . Hyperlipemia 03/19/2016  . Complicated grieving 19/47/1252  . SI (sacroiliac) pain 12/20/2015  . GAD (generalized anxiety disorder) 07/20/2015  . Insomnia 07/20/2015  . Prolapse of vaginal wall with midline cystocele 04/24/2014  . MIXED INCONTINENCE URGE AND STRESS 03/21/2009  . PANIC ATTACKS 12/10/2006  . POST TRAUMATIC STRESS DISORDER 12/10/2006  . GASTROESOPHAGEAL REFLUX, NO ESOPHAGITIS 12/10/2006    Nabilah Davoli,CHRIS, PTA 02/06/2017, 1:02 PM  [facility] [place], [place], [address] Phone: [phone]   Fax:  [phone]  Name: Margaret Barker MRN: [id number] Date of Birth: 1971-01-30

## 2017-02-10 ENCOUNTER — Ambulatory Visit: Payer: Medicare Other | Attending: Orthopedic Surgery | Admitting: Physical Therapy

## 2017-02-10 DIAGNOSIS — M542 Cervicalgia: Secondary | ICD-10-CM | POA: Insufficient documentation

## 2017-02-10 DIAGNOSIS — M6281 Muscle weakness (generalized): Secondary | ICD-10-CM | POA: Diagnosis not present

## 2017-02-10 DIAGNOSIS — R2689 Other abnormalities of gait and mobility: Secondary | ICD-10-CM | POA: Insufficient documentation

## 2017-02-10 DIAGNOSIS — G8929 Other chronic pain: Secondary | ICD-10-CM | POA: Diagnosis not present

## 2017-02-10 NOTE — Therapy (Signed)
[facility] [place], [place], [address] Phone: [phone]   Fax:  [phone]  Physical Therapy Treatment  Patient Details  Name: Margaret Barker MRN: [id number] Date of Birth: 08-18-1971 Referring Provider: Dr Melina Schools  Encounter Date: 02/10/2017      PT End of Session - 02/10/17 1151    Visit Number 4   Number of Visits 12   Authorization Type Medicare/Medicaid KX modifier at 15th visit   PT Start Time 1115   PT Stop Time 1205   PT Time Calculation (min) 50 min   Activity Tolerance Patient tolerated treatment well   Behavior During Therapy [facility] for tasks assessed/performed      Past Medical History:  Diagnosis Date  . Alcoholism ([place])   . Anxiety   . Arthritis    stenosis & scoliosis of lumbar area    . Bipolar disorder ([place])   . Depression   . Elevated liver enzymes 2015   pt. reports as of last check, liver enymes much better   . Erosion of vaginal wall due to surgical mesh ([place])   . Esophagitis   . Frequency of urination   . GERD (gastroesophageal reflux disease)   . Helicobacter pylori gastritis    DX  04-13-2014-- TREATED WITH ANTBIOTICS  . History of panic attacks   . Hyperlipidemia   . Nocturia   . Panic attacks   . SUI (stress urinary incontinence, female)   . Wears glasses     Past Surgical History:  Procedure Laterality Date  . ABDOMINAL HYSTERECTOMY    . CHOLECYSTECTOMY  1992  . COLONOSCOPY N/A 10/23/2014   Procedure: COLONOSCOPY;  Surgeon: Inda Castle, MD;  [facility];  Service: Endoscopy;  Laterality: N/A;  . CYSTOSCOPY N/A 04/24/2014   Procedure: CYSTOSCOPY;  Surgeon: Ailene Rud, MD;  Location: [facility];  Service: Urology;  Laterality: N/A;  . D & C HYSTEROSCOPY W/ POLYP REMOVAL  04-22-2006  . ESOPHAGOGASTRODUODENOSCOPY (EGD) WITH PROPOFOL N/A 10/23/2014   Procedure: ESOPHAGOGASTRODUODENOSCOPY (EGD) WITH PROPOFOL;  Surgeon: Inda Castle,  MD;  [facility];  Service: Endoscopy;  Laterality: N/A;  . LAPAROSCOPIC ASSISTED VAGINAL HYSTERECTOMY  10-22-2006  . LEFT BREAST EXCISIONAL BX  03-26-2007   BENIGN  . LESION REMOVAL  07/09/2012   Procedure: EXCISION VAGINAL LESION;  Surgeon: Ailene Rud, MD;  Location[facility];  Service: Urology;  Laterality: N/A;  Excision of Vaginal apical cyst  . PUBOVAGINAL SLING N/A 04/24/2014   Procedure: EXCISE OF VAGINAL APICAL MESH EXTRUSION;  Surgeon: Ailene Rud, MD;  Location: [facility];  Service: Urology;  Laterality: N/A;  . RIGHT EXCISIONAL BREAST BX  10-23-2005   BENIGN  . SACROILIAC JOINT FUSION Right 12/20/2015   Procedure: RIGHT SACROILIAC  FUSION;  Surgeon: Melina Schools, MD;  Location: [facility];  Service: Orthopedics;  Laterality: Right;  . SACROSPINUS ANTERIOR CULPOSUSPENSION WITH UPHOLD MESH AND SOLYX SINGLE INCISION TRANSURETHRAL SLING  08-09-2009 DR TANNENBAUM   STRESS INCONTINENCE W/ PELVIC FLOOR PROLAPSE  . TUBAL LIGATION      There were no vitals filed for this visit.      Subjective Assessment - 02/10/17 1115    Subjective neck is feeling good today; no pain.  neck pain was "bad" yesterday, reports she was cleaning and scrubbing tiles in the house.   Limitations House hold activities;Sitting   Patient Stated Goals Stop hurting, Be able to do physical activity without  hurting   Currently in Pain? No/denies                         [facility] Adult PT Treatment/Exercise - 02/10/17 1116      Neck Exercises: Supine   Neck Retraction 10 reps;5 secs   Cervical Rotation Both  2x20 sec   Other Supine Exercise horizontal abduction, bil external rotation, bil overhead pull with red theraband x 10 reps each-with chin tuck     Ultrasound   Ultrasound Location RT neck and UT   Ultrasound Parameters 1.0 w/cm2, 3.3 mHz freq, 100% DC x8 mins in sitting   Ultrasound Goals Pain     Traction   Type of Traction  Cervical   Min (lbs) 5   Max (lbs) 15   Hold Time 99   Rest Time 5   Time 15     Neck Exercises: Stretches   Upper Trapezius Stretch 2 reps;20 seconds  supine                  PT Short Term Goals - 01/30/17 1101      PT SHORT TERM GOAL #1   Title I with initial HEP.    Time 3   Period Weeks   Status New           PT Long Term Goals - 02/03/17 1248      PT LONG TERM GOAL #1   Title Pt will increase her FOTO score from 54% limitation to </= 44% limitation in order to improve functional mobility.    Time 6   Status New     PT LONG TERM GOAL #2   Title Pt will improve bilateral shoulder flexion strength  to 5/5 in order to improve function.    Time 6   Period Weeks   Status New     PT LONG TERM GOAL #3   Title Pt will be able to report pain </= 2/10 with ADL's.    Time 6   Period Weeks   Status New     PT LONG TERM GOAL #4   Title Patient able to demonstrate sitting posture correction and progressive HEP independently.    Time 6   Period Weeks   Status New               Plan - 02/10/17 1151    Clinical Impression Statement Pt arrived today without pain and reporting that traction helped last session and requesting to continue again today.  Tolerated all exercises well without pain and will plan to add to HEP if pt continues to respond well.  Progressing towards goals.    PT Treatment/Interventions Patient/family education;Functional mobility training;Therapeutic activities;Therapeutic exercise;ADLs/Self Care Home Management;Taping;Dry needling;Traction;Moist Heat;Electrical Stimulation;Cryotherapy;Manual techniques;Passive range of motion;Ultrasound   PT Next Visit Plan Cervical ROM, shoulder ROM/strengthening, continue traction PRN   PT Home Exercise Plan cervical retraction and wall angels   Consulted and Agree with Plan of Care Patient      Patient will benefit from skilled therapeutic intervention in order to improve the following  deficits and impairments:  Pain, Impaired UE functional use, Decreased range of motion, Decreased activity tolerance, Decreased strength  Visit Diagnosis: Chronic neck pain  Muscle weakness (generalized)     Problem List Patient Active Problem List   Diagnosis Date Noted  . Depression 03/19/2016  . Hyperlipemia 03/19/2016  . Complicated grieving 04/07/9484  . SI (sacroiliac) pain 12/20/2015  . GAD (generalized anxiety  disorder) 07/20/2015  . Insomnia 07/20/2015  . Prolapse of vaginal wall with midline cystocele 04/24/2014  . MIXED INCONTINENCE URGE AND STRESS 03/21/2009  . PANIC ATTACKS 12/10/2006  . POST TRAUMATIC STRESS DISORDER 12/10/2006  . GASTROESOPHAGEAL REFLUX, NO ESOPHAGITIS 12/10/2006      Laureen Abrahams, PT, DPT 02/10/17 11:55 AM    [facility] [place], [place], [address] Phone: [phone]   Fax:  [phone]  Name: MERILYNN HAYDU MRN: [id number] Date of Birth: [birth year]

## 2017-02-11 DIAGNOSIS — B359 Dermatophytosis, unspecified: Secondary | ICD-10-CM | POA: Diagnosis not present

## 2017-02-13 ENCOUNTER — Encounter: Payer: Self-pay | Admitting: Physical Therapy

## 2017-02-13 ENCOUNTER — Ambulatory Visit: Payer: Medicare Other | Admitting: Physical Therapy

## 2017-02-13 DIAGNOSIS — G8929 Other chronic pain: Secondary | ICD-10-CM | POA: Diagnosis not present

## 2017-02-13 DIAGNOSIS — M6281 Muscle weakness (generalized): Secondary | ICD-10-CM | POA: Diagnosis not present

## 2017-02-13 DIAGNOSIS — M542 Cervicalgia: Principal | ICD-10-CM

## 2017-02-13 DIAGNOSIS — R2689 Other abnormalities of gait and mobility: Secondary | ICD-10-CM | POA: Diagnosis not present

## 2017-02-13 NOTE — Therapy (Signed)
[facility] [place], [place], [address] Phone: [phone]   Fax:  [phone]  Physical Therapy Treatment  Patient Details  Name: Margaret Barker MRN: [id number] Date of Birth: [birth year] Referring Provider: Dr Melina Schools  Encounter Date: 02/13/2017      PT End of Session - 02/13/17 1123    Visit Number 5   Number of Visits 12   Authorization Type Medicare/Medicaid KX modifier at 15th visit   PT Start Time 1123   PT Stop Time 1206   PT Time Calculation (min) 43 min   Activity Tolerance Patient tolerated treatment well   Behavior During Therapy [facility] for tasks assessed/performed      Past Medical History:  Diagnosis Date  . Alcoholism ([place])   . Anxiety   . Arthritis    stenosis & scoliosis of lumbar area    . Bipolar disorder ([place])   . Depression   . Elevated liver enzymes 2015   pt. reports as of last check, liver enymes much better   . Erosion of vaginal wall due to surgical mesh ([place])   . Esophagitis   . Frequency of urination   . GERD (gastroesophageal reflux disease)   . Helicobacter pylori gastritis    DX  04-13-2014-- TREATED WITH ANTBIOTICS  . History of panic attacks   . Hyperlipidemia   . Nocturia   . Panic attacks   . SUI (stress urinary incontinence, female)   . Wears glasses     Past Surgical History:  Procedure Laterality Date  . ABDOMINAL HYSTERECTOMY    . CHOLECYSTECTOMY  1992  . COLONOSCOPY N/A 10/23/2014   Procedure: COLONOSCOPY;  Surgeon: Inda Castle, MD;  [facility];  Service: Endoscopy;  Laterality: N/A;  . CYSTOSCOPY N/A 04/24/2014   Procedure: CYSTOSCOPY;  Surgeon: Ailene Rud, MD;  Location: [facility];  Service: Urology;  Laterality: N/A;  . D & C HYSTEROSCOPY W/ POLYP REMOVAL  04-22-2006  . ESOPHAGOGASTRODUODENOSCOPY (EGD) WITH PROPOFOL N/A 10/23/2014   Procedure: ESOPHAGOGASTRODUODENOSCOPY (EGD) WITH PROPOFOL;  Surgeon: Inda Castle,  MD;  [facility];  Service: Endoscopy;  Laterality: N/A;  . LAPAROSCOPIC ASSISTED VAGINAL HYSTERECTOMY  10-22-2006  . LEFT BREAST EXCISIONAL BX  03-26-2007   BENIGN  . LESION REMOVAL  07/09/2012   Procedure: EXCISION VAGINAL LESION;  Surgeon: Ailene Rud, MD;  Location: [facility];  Service: Urology;  Laterality: N/A;  Excision of Vaginal apical cyst  . PUBOVAGINAL SLING N/A 04/24/2014   Procedure: EXCISE OF VAGINAL APICAL MESH EXTRUSION;  Surgeon: Ailene Rud, MD;  Location: [facility];  Service: Urology;  Laterality: N/A;  . RIGHT EXCISIONAL BREAST BX  10-23-2005   BENIGN  . SACROILIAC JOINT FUSION Right 12/20/2015   Procedure: RIGHT SACROILIAC  FUSION;  Surgeon: Melina Schools, MD;  Location: [place];  Service: Orthopedics;  Laterality: Right;  . SACROSPINUS ANTERIOR CULPOSUSPENSION WITH UPHOLD MESH AND SOLYX SINGLE INCISION TRANSURETHRAL SLING  08-09-2009 DR TANNENBAUM   STRESS INCONTINENCE W/ PELVIC FLOOR PROLAPSE  . TUBAL LIGATION      There were no vitals filed for this visit.      Subjective Assessment - 02/13/17 1122    Subjective Reports that her neck is a dull pain that aches and is tight around C7 protuberance.   Limitations House hold activities;Sitting   How long can you sit comfortably? 5 minutes   How long can you stand comfortably? 5 minutes  How long can you walk comfortably? not able to walk comfortably   Diagnostic tests X-ray, MRI   Patient Stated Goals Stop hurting, Be able to do physical activity without hurting   Currently in Pain? Yes   Pain Score 4    Pain Location Neck   Pain Orientation Right;Left   Pain Descriptors / Indicators Dull;Tightness   Pain Type Chronic pain   Pain Onset More than a month ago   Pain Frequency Constant            [facility] PT Assessment - 02/13/17 0001      Assessment   Medical Diagnosis Neck pain   Hand Dominance Right   Next MD Visit 02/02/17   Prior Therapy yes,  for low back pain     Precautions   Precautions None                     [facility] Adult PT Treatment/Exercise - 02/13/17 0001      Modalities   Modalities Ultrasound;Traction     Ultrasound   Ultrasound Location B UT   Ultrasound Parameters 1.5 w/cm2, 100%, 1 mhz x10 min   Ultrasound Goals Pain     Traction   Type of Traction Cervical   Min (lbs) 5   Max (lbs) 15   Hold Time 99   Rest Time 5   Time 15     Manual Therapy   Manual Therapy Myofascial release   Myofascial Release STW/MFR to B UT, Levator Scapula, cervical paraspinals to reduce tone and pain                  PT Short Term Goals - 01/30/17 1101      PT SHORT TERM GOAL #1   Title I with initial HEP.    Time 3   Period Weeks   Status New           PT Long Term Goals - 02/03/17 1248      PT LONG TERM GOAL #1   Title Pt will increase her FOTO score from 54% limitation to </= 44% limitation in order to improve functional mobility.    Time 6   Status New     PT LONG TERM GOAL #2   Title Pt will improve bilateral shoulder flexion strength  to 5/5 in order to improve function.    Time 6   Period Weeks   Status New     PT LONG TERM GOAL #3   Title Pt will be able to report pain </= 2/10 with ADL's.    Time 6   Period Weeks   Status New     PT LONG TERM GOAL #4   Title Patient able to demonstrate sitting posture correction and progressive HEP independently.    Time 6   Period Weeks   Status New               Plan - 02/13/17 1210    Clinical Impression Statement Patient arrived to treatment with increased dull pain and tightness in musculature around C7 protuberance. Tightness noted in B UT region especially along medial fibers upon palpation and also into B cervical paraspinals. Moderate release noted with manual therapy upon end of manual therapy session. Normal modalities response noted following removal of the modalities. No negetive reports given by patient upon  end of traction at 15# today.   Rehab Potential Good   PT Frequency 2x / week   PT Duration  6 weeks   PT Treatment/Interventions Patient/family education;Functional mobility training;Therapeutic activities;Therapeutic exercise;ADLs/Self Care Home Management;Taping;Dry needling;Traction;Moist Heat;Electrical Stimulation;Cryotherapy;Manual techniques;Passive range of motion;Ultrasound   PT Next Visit Plan Cervical ROM, shoulder ROM/strengthening, continue traction PRN   PT Home Exercise Plan cervical retraction and wall angels   Consulted and Agree with Plan of Care Patient      Patient will benefit from skilled therapeutic intervention in order to improve the following deficits and impairments:  Pain, Impaired UE functional use, Decreased range of motion, Decreased activity tolerance, Decreased strength  Visit Diagnosis: Chronic neck pain  Muscle weakness (generalized)     Problem List Patient Active Problem List   Diagnosis Date Noted  . Depression 03/19/2016  . Hyperlipemia 03/19/2016  . Complicated grieving 77/82/4235  . SI (sacroiliac) pain 12/20/2015  . GAD (generalized anxiety disorder) 07/20/2015  . Insomnia 07/20/2015  . Prolapse of vaginal wall with midline cystocele 04/24/2014  . MIXED INCONTINENCE URGE AND STRESS 03/21/2009  . PANIC ATTACKS 12/10/2006  . POST TRAUMATIC STRESS DISORDER 12/10/2006  . GASTROESOPHAGEAL REFLUX, NO ESOPHAGITIS 12/10/2006    Wynelle Fanny, PTA 02/13/2017, 12:17 PM  [facility] [place], [place], [address] Phone: [phone]   Fax:  [phone]  Name: Margaret Barker MRN: [id number] Date of Birth: [birth year]

## 2017-02-17 ENCOUNTER — Ambulatory Visit: Payer: Medicare Other | Admitting: Physical Therapy

## 2017-02-17 DIAGNOSIS — M542 Cervicalgia: Secondary | ICD-10-CM | POA: Diagnosis not present

## 2017-02-17 DIAGNOSIS — R2689 Other abnormalities of gait and mobility: Secondary | ICD-10-CM | POA: Diagnosis not present

## 2017-02-17 DIAGNOSIS — M6281 Muscle weakness (generalized): Secondary | ICD-10-CM | POA: Diagnosis not present

## 2017-02-17 DIAGNOSIS — G8929 Other chronic pain: Secondary | ICD-10-CM

## 2017-02-17 NOTE — Therapy (Addendum)
[facility] [place], [place], [address] Phone: [phone]   Fax:  [phone]  Physical Therapy Treatment  Patient Details  Name: Margaret Barker MRN: [id number] Date of Birth: [birth year] Referring Provider: Dr Melina Schools  Encounter Date: 02/17/2017      PT End of Session - 02/17/17 1414    Visit Number 6   Number of Visits 12   PT Start Time 1030   PT Stop Time 1121   PT Time Calculation (min) 51 min   Activity Tolerance Patient tolerated treatment well   Behavior During Therapy [facility] for tasks assessed/performed      Past Medical History:  Diagnosis Date  . Alcoholism ([place])   . Anxiety   . Arthritis    stenosis & scoliosis of lumbar area    . Bipolar disorder ([place])   . Depression   . Elevated liver enzymes 2015   pt. reports as of last check, liver enymes much better   . Erosion of vaginal wall due to surgical mesh ([place])   . Esophagitis   . Frequency of urination   . GERD (gastroesophageal reflux disease)   . Helicobacter pylori gastritis    DX  04-13-2014-- TREATED WITH ANTBIOTICS  . History of panic attacks   . Hyperlipidemia   . Nocturia   . Panic attacks   . SUI (stress urinary incontinence, female)   . Wears glasses     Past Surgical History:  Procedure Laterality Date  . ABDOMINAL HYSTERECTOMY    . CHOLECYSTECTOMY  1992  . COLONOSCOPY N/A 10/23/2014   Procedure: COLONOSCOPY;  Surgeon: Inda Castle, MD;  Location: [facility];  Service: Endoscopy;  Laterality: N/A;  . CYSTOSCOPY N/A 04/24/2014   Procedure: CYSTOSCOPY;  Surgeon: Ailene Rud, MD;  Location: [facility];  Service: Urology;  Laterality: N/A;  . D & C HYSTEROSCOPY W/ POLYP REMOVAL  04-22-2006  . ESOPHAGOGASTRODUODENOSCOPY (EGD) WITH PROPOFOL N/A 10/23/2014   Procedure: ESOPHAGOGASTRODUODENOSCOPY (EGD) WITH PROPOFOL;  Surgeon: Inda Castle, MD;  [facility];  Service: Endoscopy;  Laterality:  N/A;  . LAPAROSCOPIC ASSISTED VAGINAL HYSTERECTOMY  10-22-2006  . LEFT BREAST EXCISIONAL BX  03-26-2007   BENIGN  . LESION REMOVAL  07/09/2012   Procedure: EXCISION VAGINAL LESION;  Surgeon: Ailene Rud, MD;  Location: [facility];  Service: Urology;  Laterality: N/A;  Excision of Vaginal apical cyst  . PUBOVAGINAL SLING N/A 04/24/2014   Procedure: EXCISE OF VAGINAL APICAL MESH EXTRUSION;  Surgeon: Ailene Rud, MD;  Location: [facility];  Service: Urology;  Laterality: N/A;  . RIGHT EXCISIONAL BREAST BX  10-23-2005   BENIGN  . SACROILIAC JOINT FUSION Right 12/20/2015   Procedure: RIGHT SACROILIAC  FUSION;  Surgeon: Melina Schools, MD;  Location: [place];  Service: Orthopedics;  Laterality: Right;  . SACROSPINUS ANTERIOR CULPOSUSPENSION WITH UPHOLD MESH AND SOLYX SINGLE INCISION TRANSURETHRAL SLING  08-09-2009 DR TANNENBAUM   STRESS INCONTINENCE W/ PELVIC FLOOR PROLAPSE  . TUBAL LIGATION      There were no vitals filed for this visit.      Subjective Assessment - 02/17/17 1502    Subjective I hurt a lot since my last treatment.   Pain Score 6    Pain Onset More than a month ago  PT Short Term Goals - 01/30/17 1101      PT SHORT TERM GOAL #1   Title I with initial HEP.    Time 3   Period Weeks   Status New           PT Long Term Goals - 02/03/17 1248      PT LONG TERM GOAL #1   Title Pt will increase her FOTO score from 54% limitation to </= 44% limitation in order to improve functional mobility.    Time 6   Status New     PT LONG TERM GOAL #2   Title Pt will improve bilateral shoulder flexion strength  to 5/5 in order to improve function.    Time 6   Period Weeks   Status New     PT LONG TERM GOAL #3   Title Pt will be able to report pain </= 2/10 with ADL's.    Time 6   Period Weeks   Status New     PT LONG TERM GOAL #4   Title Patient able to demonstrate  sitting posture correction and progressive HEP independently.    Time 6   Period Weeks   Status New               Plan - 02/17/17 1434    Clinical Impression Statement Suggested patient may try dry needling.      Patient will benefit from skilled therapeutic intervention in order to improve the following deficits and impairments:  Pain, Impaired UE functional use, Decreased range of motion, Decreased activity tolerance, Decreased strength  Visit Diagnosis: Chronic neck pain  Muscle weakness (generalized)     Problem List Patient Active Problem List   Diagnosis Date Noted  . Depression 03/19/2016  . Hyperlipemia 03/19/2016  . Complicated grieving 10/21/3233  . SI (sacroiliac) pain 12/20/2015  . GAD (generalized anxiety disorder) 07/20/2015  . Insomnia 07/20/2015  . Prolapse of vaginal wall with midline cystocele 04/24/2014  . MIXED INCONTINENCE URGE AND STRESS 03/21/2009  . PANIC ATTACKS 12/10/2006  . POST TRAUMATIC STRESS DISORDER 12/10/2006  . GASTROESOPHAGEAL REFLUX, NO ESOPHAGITIS 12/10/2006     Treatment:   Pulleys x 6 minutes f/b STW/M to left UT x 20 minutes including TP release technique f/b Pre-mod (5 sec on and 5 sec off) x 15 minutes.  Patient tolerated treatment without complaint.  Lilymae Swiech, [place] MPT 02/17/2017, 3:03 PM  [facility] [address] [place], [place], [address] Phone: [phone]   Fax:  [phone]  Name: Margaret Barker MRN: [id number] Date of Birth: [birth year]

## 2017-02-20 ENCOUNTER — Ambulatory Visit: Payer: Medicare Other | Admitting: *Deleted

## 2017-02-20 DIAGNOSIS — M542 Cervicalgia: Principal | ICD-10-CM

## 2017-02-20 DIAGNOSIS — R2689 Other abnormalities of gait and mobility: Secondary | ICD-10-CM

## 2017-02-20 DIAGNOSIS — G8929 Other chronic pain: Secondary | ICD-10-CM

## 2017-02-20 DIAGNOSIS — M6281 Muscle weakness (generalized): Secondary | ICD-10-CM

## 2017-02-20 NOTE — Therapy (Signed)
[facility] [place], [place], [address] Phone: [phone]   Fax:  [phone]  Physical Therapy Treatment  Patient Details  Name: Margaret Barker MRN: [id number] Date of Birth: [birth year] Referring Provider: Dr Melina Schools  Encounter Date: 02/20/2017      PT End of Session - 02/20/17 1125    Visit Number 7   Number of Visits 12   Authorization Type Medicare/Medicaid KX modifier at 15th visit   PT Start Time 1030   PT Stop Time 1123   PT Time Calculation (min) 53 min      Past Medical History:  Diagnosis Date  . Alcoholism ([place])   . Anxiety   . Arthritis    stenosis & scoliosis of lumbar area    . Bipolar disorder ([place])   . Depression   . Elevated liver enzymes 2015   pt. reports as of last check, liver enymes much better   . Erosion of vaginal wall due to surgical mesh ([place])   . Esophagitis   . Frequency of urination   . GERD (gastroesophageal reflux disease)   . Helicobacter pylori gastritis    DX  04-13-2014-- TREATED WITH ANTBIOTICS  . History of panic attacks   . Hyperlipidemia   . Nocturia   . Panic attacks   . SUI (stress urinary incontinence, female)   . Wears glasses     Past Surgical History:  Procedure Laterality Date  . ABDOMINAL HYSTERECTOMY    . CHOLECYSTECTOMY  1992  . COLONOSCOPY N/A 10/23/2014   Procedure: COLONOSCOPY;  Surgeon: Inda Castle, MD;  [facility];  Service: Endoscopy;  Laterality: N/A;  . CYSTOSCOPY N/A 04/24/2014   Procedure: CYSTOSCOPY;  Surgeon: Ailene Rud, MD;  Location: [facility];  Service: Urology;  Laterality: N/A;  . D & C HYSTEROSCOPY W/ POLYP REMOVAL  04-22-2006  . ESOPHAGOGASTRODUODENOSCOPY (EGD) WITH PROPOFOL N/A 10/23/2014   Procedure: ESOPHAGOGASTRODUODENOSCOPY (EGD) WITH PROPOFOL;  Surgeon: Inda Castle, MD;  [facility];  Service: Endoscopy;  Laterality: N/A;  . LAPAROSCOPIC ASSISTED VAGINAL HYSTERECTOMY   10-22-2006  . LEFT BREAST EXCISIONAL BX  03-26-2007   BENIGN  . LESION REMOVAL  07/09/2012   Procedure: EXCISION VAGINAL LESION;  Surgeon: Ailene Rud, MD;  Location: [facility];  Service: Urology;  Laterality: N/A;  Excision of Vaginal apical cyst  . PUBOVAGINAL SLING N/A 04/24/2014   Procedure: EXCISE OF VAGINAL APICAL MESH EXTRUSION;  Surgeon: Ailene Rud, MD;  Location: [facility];  Service: Urology;  Laterality: N/A;  . RIGHT EXCISIONAL BREAST BX  10-23-2005   BENIGN  . SACROILIAC JOINT FUSION Right 12/20/2015   Procedure: RIGHT SACROILIAC  FUSION;  Surgeon: Melina Schools, MD;  Location: [place];  Service: Orthopedics;  Laterality: Right;  . SACROSPINUS ANTERIOR CULPOSUSPENSION WITH UPHOLD MESH AND SOLYX SINGLE INCISION TRANSURETHRAL SLING  08-09-2009 DR TANNENBAUM   STRESS INCONTINENCE W/ PELVIC FLOOR PROLAPSE  . TUBAL LIGATION      There were no vitals filed for this visit.                       [facility] Adult PT Treatment/Exercise - 02/20/17 0001      Modalities   Modalities Electrical Stimulation;Ultrasound     Acupuncturist Location LT levator, RT Utrap   2 Chs Premod x15 mins 5 secs on/off in supine   Electrical Stimulation Goals Pain;Tone  Ultrasound   Ultrasound Location LT Utrap/ levator   Ultrasound Parameters 1.5 w/cm2 x 10 mins   Ultrasound Goals Pain     Manual Therapy   Manual Therapy Myofascial release;Soft tissue mobilization   Myofascial Release STW/MFR  IASTM,  TPR to LT UT, Levator Scapula, cervical paraspinals to reduce tone and pain                  PT Short Term Goals - 01/30/17 1101      PT SHORT TERM GOAL #1   Title I with initial HEP.    Time 3   Period Weeks   Status New           PT Long Term Goals - 02/03/17 1248      PT LONG TERM GOAL #1   Title Pt will increase her FOTO score from 54% limitation to </= 44% limitation in  order to improve functional mobility.    Time 6   Status New     PT LONG TERM GOAL #2   Title Pt will improve bilateral shoulder flexion strength  to 5/5 in order to improve function.    Time 6   Period Weeks   Status New     PT LONG TERM GOAL #3   Title Pt will be able to report pain </= 2/10 with ADL's.    Time 6   Period Weeks   Status New     PT LONG TERM GOAL #4   Title Patient able to demonstrate sitting posture correction and progressive HEP independently.    Time 6   Period Weeks   Status New               Plan - 02/20/17 1039    Clinical Impression Statement Pt did fairly well after last Rx. She did well again with today's Rx as well. She presents with pain and tightness in LT side UT and levator. She had TPs in both and had good releases in both with TPR and active release.. Traction was not performed the last 2 Rxs due to increased discomfort after last traction Rx.   Rehab Potential Good   PT Frequency 2x / week   PT Duration 6 weeks   PT Treatment/Interventions Patient/family education;Functional mobility training;Therapeutic activities;Therapeutic exercise;ADLs/Self Care Home Management;Taping;Dry needling;Traction;Moist Heat;Electrical Stimulation;Cryotherapy;Manual techniques;Passive range of motion;Ultrasound   PT Next Visit Plan Cervical ROM, shoulder ROM/strengthening, continue traction PRN   PT Home Exercise Plan cervical retraction and wall angels   Consulted and Agree with Plan of Care Patient      Patient will benefit from skilled therapeutic intervention in order to improve the following deficits and impairments:  Pain, Impaired UE functional use, Decreased range of motion, Decreased activity tolerance, Decreased strength  Visit Diagnosis: Chronic neck pain  Muscle weakness (generalized)  Other abnormalities of gait and mobility     Problem List Patient Active Problem List   Diagnosis Date Noted  . Depression 03/19/2016  .  Hyperlipemia 03/19/2016  . Complicated grieving 63/10/6008  . SI (sacroiliac) pain 12/20/2015  . GAD (generalized anxiety disorder) 07/20/2015  . Insomnia 07/20/2015  . Prolapse of vaginal wall with midline cystocele 04/24/2014  . MIXED INCONTINENCE URGE AND STRESS 03/21/2009  . PANIC ATTACKS 12/10/2006  . POST TRAUMATIC STRESS DISORDER 12/10/2006  . GASTROESOPHAGEAL REFLUX, NO ESOPHAGITIS 12/10/2006    Harper Vandervoort,CHRIS, PTA 02/20/2017, 11:50 AM  [facility] [address] [place], [place], [address] Phone: [phone]   Fax:  [phone]  Name: Margaret Barker MRN: [id number] Date of Birth: 1971/05/09

## 2017-02-24 ENCOUNTER — Ambulatory Visit: Payer: Medicare Other | Admitting: Physical Therapy

## 2017-02-24 DIAGNOSIS — M542 Cervicalgia: Principal | ICD-10-CM

## 2017-02-24 DIAGNOSIS — M6281 Muscle weakness (generalized): Secondary | ICD-10-CM | POA: Diagnosis not present

## 2017-02-24 DIAGNOSIS — R2689 Other abnormalities of gait and mobility: Secondary | ICD-10-CM | POA: Diagnosis not present

## 2017-02-24 DIAGNOSIS — G8929 Other chronic pain: Secondary | ICD-10-CM

## 2017-02-24 NOTE — Therapy (Addendum)
[facility] [place], [place], [address] Phone: [phone]   Fax:  [phone]  Physical Therapy Treatment  Patient Details  Name: Margaret Barker MRN: [id number] Date of Birth: 1971-04-15 Referring Provider: Dr Melina Schools  Encounter Date: 02/24/2017      PT End of Session - 02/24/17 1120    Visit Number 8   Number of Visits 12   Authorization Type Medicare/Medicaid KX modifier at 15th visit   PT Start Time 1120   PT Stop Time 1217   PT Time Calculation (min) 57 min   Activity Tolerance Patient tolerated treatment well   Behavior During Therapy [facility] for tasks assessed/performed      Past Medical History:  Diagnosis Date  . Alcoholism ([place])   . Anxiety   . Arthritis    stenosis & scoliosis of lumbar area    . Bipolar disorder ([place])   . Depression   . Elevated liver enzymes 2015   pt. reports as of last check, liver enymes much better   . Erosion of vaginal wall due to surgical mesh ([place])   . Esophagitis   . Frequency of urination   . GERD (gastroesophageal reflux disease)   . Helicobacter pylori gastritis    DX  04-13-2014-- TREATED WITH ANTBIOTICS  . History of panic attacks   . Hyperlipidemia   . Nocturia   . Panic attacks   . SUI (stress urinary incontinence, female)   . Wears glasses     Past Surgical History:  Procedure Laterality Date  . ABDOMINAL HYSTERECTOMY    . CHOLECYSTECTOMY  1992  . COLONOSCOPY N/A 10/23/2014   Procedure: COLONOSCOPY;  Surgeon: Inda Castle, MD;  [facility];  Service: Endoscopy;  Laterality: N/A;  . CYSTOSCOPY N/A 04/24/2014   Procedure: CYSTOSCOPY;  Surgeon: Ailene Rud, MD;  Location: [facility];  Service: Urology;  Laterality: N/A;  . D & C HYSTEROSCOPY W/ POLYP REMOVAL  04-22-2006  . ESOPHAGOGASTRODUODENOSCOPY (EGD) WITH PROPOFOL N/A 10/23/2014   Procedure: ESOPHAGOGASTRODUODENOSCOPY (EGD) WITH PROPOFOL;  Surgeon: Inda Castle,  MD;  [facility];  Service: Endoscopy;  Laterality: N/A;  . LAPAROSCOPIC ASSISTED VAGINAL HYSTERECTOMY  10-22-2006  . LEFT BREAST EXCISIONAL BX  03-26-2007   BENIGN  . LESION REMOVAL  07/09/2012   Procedure: EXCISION VAGINAL LESION;  Surgeon: Ailene Rud, MD;  Location: [facility];  Service: Urology;  Laterality: N/A;  Excision of Vaginal apical cyst  . PUBOVAGINAL SLING N/A 04/24/2014   Procedure: EXCISE OF VAGINAL APICAL MESH EXTRUSION;  Surgeon: Ailene Rud, MD;  Location: [facility];  Service: Urology;  Laterality: N/A;  . RIGHT EXCISIONAL BREAST BX  10-23-2005   BENIGN  . SACROILIAC JOINT FUSION Right 12/20/2015   Procedure: RIGHT SACROILIAC  FUSION;  Surgeon: Melina Schools, MD;  Location: [place];  Service: Orthopedics;  Laterality: Right;  . SACROSPINUS ANTERIOR CULPOSUSPENSION WITH UPHOLD MESH AND SOLYX SINGLE INCISION TRANSURETHRAL SLING  08-09-2009 DR TANNENBAUM   STRESS INCONTINENCE W/ PELVIC FLOOR PROLAPSE  . TUBAL LIGATION      There were no vitals filed for this visit.      Subjective Assessment - 02/24/17 1123    Subjective Patient reports that when she turns her neck it his very stiff and it varies. Yesterday was severe. 7/10. Also she is sore for 2 days after STW.   Limitations House hold activities;Sitting   Patient Stated Goals Stop hurting, Be able  to do physical activity without hurting   Currently in Pain? Yes   Pain Score 4    Pain Location Neck   Pain Orientation Right;Left   Pain Descriptors / Indicators Dull;Tightness   Pain Type Chronic pain   Pain Onset More than a month ago            [facility] PT Assessment - 02/24/17 0001      Posture/Postural Control   Posture/Postural Control Postural limitations   Postural Limitations Rounded Shoulders;Forward head   Posture Comments mild scoliolis  R lumbar                     [facility] Adult PT Treatment/Exercise - 02/24/17 0001       Modalities   Modalities Electrical Stimulation;Moist Heat     Moist Heat Therapy   Number Minutes Moist Heat 15 Minutes   Moist Heat Location Cervical     Electrical Stimulation   Electrical Stimulation Location B cspine and UT 80-150 Hz x 32mn   Electrical Stimulation Goals Pain;Tone     Manual Therapy   Manual Therapy Soft tissue mobilization   Soft tissue mobilization B UT, LS, cerv paraspinals and subocciptials     Neck Exercises: Stretches   Upper Trapezius Stretch 2 reps;30 seconds   Levator Stretch 2 reps;30 seconds   Corner Stretch 1 rep;30 seconds          Trigger Point Dry Needling - 02/24/17 1223    Consent Given? Yes   Education Handout Provided Yes   Muscles Treated Upper Body Upper trapezius;Suboccipitals muscle group;Levator scapulae  B and C2-C5 multifidi   Upper Trapezius Response Twitch reponse elicited;Palpable increased muscle length   SubOccipitals Response Palpable increased muscle length;Twitch response elicited   Levator Scapulae Response Twitch response elicited;Palpable increased muscle length              PT Education - 02/24/17 1216    Education provided Yes   Education Details HEP; DN education and aftercare   Person(s) Educated Patient   Methods Explanation;Demonstration;Verbal cues;Handout   Comprehension Verbalized understanding;Returned demonstration          PT Short Term Goals - 01/30/17 1101      PT SHORT TERM GOAL #1   Title I with initial HEP.    Time 3   Period Weeks   Status New           PT Long Term Goals - 02/03/17 1248      PT LONG TERM GOAL #1   Title Pt will increase her FOTO score from 54% limitation to </= 44% limitation in order to improve functional mobility.    Time 6   Status New     PT LONG TERM GOAL #2   Title Pt will improve bilateral shoulder flexion strength  to 5/5 in order to improve function.    Time 6   Period Weeks   Status New     PT LONG TERM GOAL #3   Title Pt will be able  to report pain </= 2/10 with ADL's.    Time 6   Period Weeks   Status New     PT LONG TERM GOAL #4   Title Patient able to demonstrate sitting posture correction and progressive HEP independently.    Time 6   Period Weeks   Status New               Plan - 02/24/17 1217    Clinical  Impression Statement Patient did very well with DN today. She had ++ twitch responses in L UT, lev scap and cervical multifidi with normal response on R side and suboccipitals as well. Full cervical ROM to the R after Rx. R rotation is still limited 25%.   PT Treatment/Interventions Patient/family education;Functional mobility training;Therapeutic activities;Therapeutic exercise;ADLs/Self Care Home Management;Taping;Dry needling;Traction;Moist Heat;Electrical Stimulation;Cryotherapy;Manual techniques;Passive range of motion;Ultrasound   PT Next Visit Plan Assess DN, Add L QL/lumbar paraspinal stretch; Review UT/LS stretches; Cervical ROM, shoulder ROM/strengthening, continue traction PRN   PT Home Exercise Plan UT/LS stretches, doorway stretch, cervical retraction and wall angels      Patient will benefit from skilled therapeutic intervention in order to improve the following deficits and impairments:  Pain, Impaired UE functional use, Decreased range of motion, Decreased activity tolerance, Decreased strength  Visit Diagnosis: Chronic neck pain     Problem List Patient Active Problem List   Diagnosis Date Noted  . Depression 03/19/2016  . Hyperlipemia 03/19/2016  . Complicated grieving 86/82/5749  . SI (sacroiliac) pain 12/20/2015  . GAD (generalized anxiety disorder) 07/20/2015  . Insomnia 07/20/2015  . Prolapse of vaginal wall with midline cystocele 04/24/2014  . MIXED INCONTINENCE URGE AND STRESS 03/21/2009  . PANIC ATTACKS 12/10/2006  . POST TRAUMATIC STRESS DISORDER 12/10/2006  . GASTROESOPHAGEAL REFLUX, NO ESOPHAGITIS 12/10/2006   Madelyn Flavors PT 02/24/2017, 12:28 PM  [facility] [address] [place], [place], [address] Phone: [phone]   Fax:  [phone]  Name: Margaret Barker MRN: [id number] Date of Birth: [birth year]  PHYSICAL THERAPY DISCHARGE SUMMARY  Visits from Start of Care: 8.  Current functional level related to goals / functional outcomes: See above.   Remaining deficits: See below.   Education / Equipment: HEP. Plan: Patient agrees to discharge.  Patient goals were not met. Patient is being discharged due to not returning since the last visit.  ?????         Mali Applegate MPT

## 2017-02-24 NOTE — Patient Instructions (Addendum)
  Flexibility: Upper Trapezius Stretch   Gently grasp right side of head while reaching behind back with other hand. Tilt head away until a gentle stretch is felt. Hold 30 seconds. Repeat 3 times per set. Do 2 sessions per day.  http://orth.exer.us/340   Levator Stretch   Grasp seat or sit on hand on side to be stretched. Turn head toward other side and look down. Use hand on head to gently stretch neck in that position. Hold _30___ seconds. Repeat on other side. Repeat 3 times. Do 2 sessions per day.    Flexibility: Corner Stretch   Standing in corner or a doorway with hands just above shoulder level.  Lean forward until a comfortable stretch is felt across chest. Hold __30__ seconds. Repeat __3__ times per set.  Do _2___ sessions per day.  http://orth.exer.us/342   Copyright  VHI. All rights reserved.   Trigger Point Dry Needling  . What is Trigger Point Dry Needling (DN)? o DN is a physical therapy technique used to treat muscle pain and dysfunction. Specifically, DN helps deactivate muscle trigger points (muscle knots).  o A thin filiform needle is used to penetrate the skin and stimulate the underlying trigger point. The goal is for a local twitch response (LTR) to occur and for the trigger point to relax. No medication of any kind is injected during the procedure.   . What Does Trigger Point Dry Needling Feel Like?  o The procedure feels different for each individual patient. Some patients report that they do not actually feel the needle enter the skin and overall the process is not painful. Very mild bleeding may occur. However, many patients feel a deep cramping in the muscle in which the needle was inserted. This is the local twitch response.   Marland Kitchen How Will I feel after the treatment? o Soreness is normal, and the onset of soreness may not occur for a few hours. Typically this soreness does not last longer than two days.  o Bruising is uncommon, however; ice can be used to  decrease any possible bruising.  o In rare cases feeling tired or nauseous after the treatment is normal. In addition, your symptoms may get worse before they get better, this period will typically not last longer than 24 hours.   . What Can I do After My Treatment? o Increase your hydration by drinking more water for the next 24 hours. o You may place ice or heat on the areas treated that have become sore, however, do not use heat on inflamed or bruised areas. Heat often brings more relief post needling. o You can continue your regular activities, but vigorous activity is not recommended initially after the treatment for 24 hours. o DN is best combined with other physical therapy such as strengthening, stretching, and other therapies.    Precautions:  In some cases, dry needling is done over the lung field. While rare, there is a risk of pneumothorax (punctured lung). Because of this, if you ever experience shortness of breath on exertion, difficulty taking a deep breath, chest pain or a dry cough following dry needling, you should report to an emergency room and tell them that you have been dry needled over the thorax.  Margaret Barker, PT 02/24/17 12:16 PM [facility] [place], [place], [address] Phone: [phone]   Fax:  [phone]

## 2017-03-02 DIAGNOSIS — G5601 Carpal tunnel syndrome, right upper limb: Secondary | ICD-10-CM | POA: Diagnosis not present

## 2017-03-02 DIAGNOSIS — M65352 Trigger finger, left little finger: Secondary | ICD-10-CM | POA: Diagnosis not present

## 2017-03-03 ENCOUNTER — Ambulatory Visit: Payer: Medicare Other | Admitting: Physical Therapy

## 2017-03-06 DIAGNOSIS — F329 Major depressive disorder, single episode, unspecified: Secondary | ICD-10-CM | POA: Diagnosis not present

## 2017-03-13 DIAGNOSIS — M542 Cervicalgia: Secondary | ICD-10-CM | POA: Diagnosis not present

## 2017-03-13 DIAGNOSIS — M4682 Other specified inflammatory spondylopathies, cervical region: Secondary | ICD-10-CM | POA: Diagnosis not present

## 2017-03-30 DIAGNOSIS — M65352 Trigger finger, left little finger: Secondary | ICD-10-CM | POA: Diagnosis not present

## 2017-03-30 DIAGNOSIS — G5601 Carpal tunnel syndrome, right upper limb: Secondary | ICD-10-CM | POA: Diagnosis not present

## 2017-04-03 DIAGNOSIS — M5010 Cervical disc disorder with radiculopathy, unspecified cervical region: Secondary | ICD-10-CM | POA: Diagnosis not present

## 2017-04-03 DIAGNOSIS — M5033 Other cervical disc degeneration, cervicothoracic region: Secondary | ICD-10-CM | POA: Diagnosis not present

## 2017-04-03 DIAGNOSIS — M542 Cervicalgia: Secondary | ICD-10-CM | POA: Diagnosis not present

## 2017-04-03 DIAGNOSIS — M5412 Radiculopathy, cervical region: Secondary | ICD-10-CM | POA: Diagnosis not present

## 2017-04-15 DIAGNOSIS — G5601 Carpal tunnel syndrome, right upper limb: Secondary | ICD-10-CM | POA: Diagnosis not present

## 2017-04-21 DIAGNOSIS — E785 Hyperlipidemia, unspecified: Secondary | ICD-10-CM | POA: Diagnosis not present

## 2017-04-22 DIAGNOSIS — L02213 Cutaneous abscess of chest wall: Secondary | ICD-10-CM | POA: Diagnosis not present

## 2017-04-27 DIAGNOSIS — G5601 Carpal tunnel syndrome, right upper limb: Secondary | ICD-10-CM | POA: Diagnosis not present

## 2017-05-22 DIAGNOSIS — N393 Stress incontinence (female) (male): Secondary | ICD-10-CM | POA: Diagnosis not present

## 2017-05-22 DIAGNOSIS — R799 Abnormal finding of blood chemistry, unspecified: Secondary | ICD-10-CM | POA: Diagnosis not present

## 2017-05-22 DIAGNOSIS — K219 Gastro-esophageal reflux disease without esophagitis: Secondary | ICD-10-CM | POA: Diagnosis not present

## 2017-05-28 DIAGNOSIS — G5601 Carpal tunnel syndrome, right upper limb: Secondary | ICD-10-CM | POA: Diagnosis not present

## 2017-06-11 DIAGNOSIS — D72829 Elevated white blood cell count, unspecified: Secondary | ICD-10-CM | POA: Diagnosis not present

## 2017-06-22 DIAGNOSIS — F329 Major depressive disorder, single episode, unspecified: Secondary | ICD-10-CM | POA: Diagnosis not present

## 2017-06-25 ENCOUNTER — Other Ambulatory Visit: Payer: Self-pay | Admitting: Family Medicine

## 2017-06-29 DIAGNOSIS — Z23 Encounter for immunization: Secondary | ICD-10-CM | POA: Diagnosis not present

## 2017-06-29 DIAGNOSIS — Z1231 Encounter for screening mammogram for malignant neoplasm of breast: Secondary | ICD-10-CM | POA: Diagnosis not present

## 2017-06-29 DIAGNOSIS — Z0000 Encounter for general adult medical examination without abnormal findings: Secondary | ICD-10-CM | POA: Diagnosis not present

## 2017-07-03 DIAGNOSIS — G8929 Other chronic pain: Secondary | ICD-10-CM | POA: Diagnosis not present

## 2017-07-03 DIAGNOSIS — M545 Low back pain: Secondary | ICD-10-CM | POA: Diagnosis not present

## 2017-07-10 ENCOUNTER — Encounter: Payer: Self-pay | Admitting: Gastroenterology

## 2017-07-10 ENCOUNTER — Ambulatory Visit (INDEPENDENT_AMBULATORY_CARE_PROVIDER_SITE_OTHER): Payer: Medicare Other | Admitting: Gastroenterology

## 2017-07-10 VITALS — BP 90/64 | HR 72 | Ht 63.75 in | Wt 162.5 lb

## 2017-07-10 DIAGNOSIS — R110 Nausea: Secondary | ICD-10-CM

## 2017-07-10 DIAGNOSIS — K219 Gastro-esophageal reflux disease without esophagitis: Secondary | ICD-10-CM

## 2017-07-10 MED ORDER — PANTOPRAZOLE SODIUM 40 MG PO TBEC: 40.0000 mg | DELAYED_RELEASE_TABLET | Freq: Two times a day (BID) | ORAL | 3 refills | Status: DC

## 2017-07-10 NOTE — Patient Instructions (Addendum)
We have sent the following medications to your pharmacy for you to pick up at your convenience: Protonix 40 mg twice a day.  If you are age 46 or older, your body mass index should be between 23-30. Your Body mass index is 28.11 kg/m. If this is out of the aforementioned range listed, please consider follow up with your Primary Care Provider.  If you are age 9 or younger, your body mass index should be between 19-25. Your Body mass index is 28.11 kg/m. If this is out of the aformentioned range listed, please consider follow up with your Primary Care Provider.   Thank you for choosing [facility]  Dr Wilfrid Lund III    Gastroesophageal Reflux Disease, Adult  Normally, food travels down the esophagus and stays in the stomach to be digested. If a person has gastroesophageal reflux disease (GERD), food and stomach acid move back up into the esophagus. When this happens, the esophagus becomes sore and swollen (inflamed). Over time, GERD can make small holes (ulcers) in the lining of the esophagus. Follow these instructions at home: Diet  Follow a diet as told by your doctor. You may need to avoid foods and drinks such as: ? Coffee and tea (with or without caffeine). ? Drinks that contain alcohol. ? Energy drinks and sports drinks. ? Carbonated drinks or sodas. ? Chocolate and cocoa. ? Peppermint and mint flavorings. ? Garlic and onions. ? Horseradish. ? Spicy and acidic foods, such as peppers, chili powder, curry powder, vinegar, hot sauces, and BBQ sauce. ? Citrus fruit juices and citrus fruits, such as oranges, lemons, and limes. ? Tomato-based foods, such as red sauce, chili, salsa, and pizza with red sauce. ? Fried and fatty foods, such as donuts, french fries, potato chips, and high-fat dressings. ? High-fat meats, such as hot dogs, rib eye steak, sausage, ham, and bacon. ? High-fat dairy items, such as whole milk, butter, and cream cheese.  Eat small meals often. Avoid eating  large meals.  Avoid drinking large amounts of liquid with your meals.  Avoid eating meals during the 2-3 hours before bedtime.  Avoid lying down right after you eat.  Do not exercise right after you eat. General instructions  Pay attention to any changes in your symptoms.  Take over-the-counter and prescription medicines only as told by your doctor. Do not take aspirin, ibuprofen, or other NSAIDs unless your doctor says it is okay.  Do not use any tobacco products, including cigarettes, chewing tobacco, and e-cigarettes. If you need help quitting, ask your doctor.  Wear loose clothes. Do not wear anything tight around your waist.  Raise (elevate) the head of your bed about 6 inches (15 cm).  Try to lower your stress. If you need help doing this, ask your doctor.  If you are overweight, lose an amount of weight that is healthy for you. Ask your doctor about a safe weight loss goal.  Keep all follow-up visits as told by your doctor. This is important. Contact a doctor if:  You have new symptoms.  You lose weight and you do not know why it is happening.  You have trouble swallowing, or it hurts to swallow.  You have wheezing or a cough that keeps happening.  Your symptoms do not get better with treatment.  You have a hoarse voice. Get help right away if:  You have pain in your arms, neck, jaw, teeth, or back.  You feel sweaty, dizzy, or light-headed.  You have chest pain  or shortness of breath.  You throw up (vomit) and your throw up looks like blood or coffee grounds.  You pass out (faint).  Your poop (stool) is bloody or black.  You cannot swallow, drink, or eat. This information is not intended to replace advice given to you by your health care provider. Make sure you discuss any questions you have with your health care provider. Document Released: 03/17/2008 Document Revised: 03/06/2016 Document Reviewed: 01/24/2015 Elsevier Interactive Patient Education   2018 [place] for Gastroesophageal Reflux Disease, Adult When you have gastroesophageal reflux disease (GERD), the foods you eat and your eating habits are very important. Choosing the right foods can help ease your discomfort. What guidelines do I need to follow?  Choose fruits, vegetables, whole grains, and low-fat dairy products.  Choose low-fat meat, fish, and poultry.    Limit fats such as oils, salad dressings, butter, nuts, and avocado.  Keep a food diary. This helps you identify foods that cause symptoms.  Avoid foods that cause symptoms. These may be different for everyone.  Eat small meals often instead of 3 large meals a day.  Eat your meals slowly, in a place where you are relaxed.  Limit fried foods.  Cook foods using methods other than frying.  Avoid drinking alcohol.  Avoid drinking large amounts of liquids with your meals.  Avoid bending over or lying down until 2-3 hours after eating. What foods are not recommended? These are some foods and drinks that may make your symptoms worse: Vegetables Tomatoes. Tomato juice. Tomato and spaghetti sauce. Chili peppers. Onion and garlic. Horseradish. Fruits Oranges, grapefruit, and lemon (fruit and juice). Meats High-fat meats, fish, and poultry. This includes hot dogs, ribs, ham, sausage, salami, and bacon. Dairy Whole milk and chocolate milk. Sour cream. Cream. Butter. Ice cream. Cream cheese. Drinks Coffee and tea. Bubbly (carbonated) drinks or energy drinks. Condiments Hot sauce. Barbecue sauce. Sweets/Desserts Chocolate and cocoa. Donuts. Peppermint and spearmint. Fats and Oils High-fat foods. This includes [place] fries and potato chips. Other Vinegar. Strong spices. This includes black pepper, white pepper, red pepper, cayenne, curry powder, cloves, ginger, and chili powder. The items listed above may not be a complete list of foods and drinks to avoid. Contact your dietitian for  more information. This information is not intended to replace advice given to you by your health care provider. Make sure you discuss any questions you have with your health care provider. Document Released: 03/30/2012 Document Revised: 03/06/2016 Document Reviewed: 08/03/2013 Elsevier Interactive Patient Education  2017 [place] with Quitting Smoking  Quitting smoking is a physical and mental challenge. You will face cravings, withdrawal symptoms, and temptation. Before quitting, work with your health care provider to make a plan that can help you cope. Preparation can help you quit and keep you from giving in. How can I cope with cravings? Cravings usually last for 5-10 minutes. If you get through it, the craving will pass. Consider taking the following actions to help you cope with cravings:  Keep your mouth busy: ? Chew sugar-free gum. ? Suck on hard candies or a straw. ? Brush your teeth.  Keep your hands and body busy: ? Immediately change to a different activity when you feel a craving. ? Squeeze or play with a ball. ? Do an activity or a hobby, like making bead jewelry, practicing needlepoint, or working with wood. ? Mix up your normal routine. ? Take a short exercise break.  Go for a quick walk or run up and down stairs. ? Spend time in public places where smoking is not allowed.  Focus on doing something kind or helpful for someone else.  Call a friend or family member to talk during a craving.  Join a support group.  Call a quit line, such as 1-800-QUIT-NOW.  Talk with your health care provider about medicines that might help you cope with cravings and make quitting easier for you.  How can I deal with withdrawal symptoms? Your body may experience negative effects as it tries to get used to not having nicotine in the system. These effects are called withdrawal symptoms. They may include:  Feeling hungrier than normal.  Trouble  concentrating.  Irritability.  Trouble sleeping.  Feeling depressed.  Restlessness and agitation.  Craving a cigarette.  To manage withdrawal symptoms:  Avoid places, people, and activities that trigger your cravings.  Remember why you want to quit.  Get plenty of sleep.  Avoid coffee and other caffeinated drinks. These may worsen some of your symptoms.  How can I handle social situations? Social situations can be difficult when you are quitting smoking, especially in the first few weeks. To manage this, you can:  Avoid parties, bars, and other social situations where people might be smoking.  Avoid alcohol.  Leave right away if you have the urge to smoke.  Explain to your family and friends that you are quitting smoking. Ask for understanding and support.  Plan activities with friends or family where smoking is not an option.  What are some ways I can cope with stress?  Wanting to smoke may cause stress, and stress can make you want to smoke. Find ways to manage your stress. Relaxation techniques can help. For example:  Breathe slowly and deeply, in through your nose and out through your mouth.  Listen to soothing, relaxing music.  Talk with a family member or friend about your stress.  Light a candle.  Soak in a bath or take a shower.  Think about a peaceful place.  What are some ways I can prevent weight gain?  Be aware that many people gain weight after they quit smoking. However, not everyone does. To keep from gaining weight, have a plan in place before you quit and stick to the plan after you quit. Your plan should include:  Having healthy snacks. When you have a craving, it may help to: ? Eat plain popcorn, crunchy carrots, celery, or other cut vegetables. ? Chew sugar-free gum.  Changing how you eat: ? Eat small portion sizes at meals. ? Eat 4-6 small meals throughout the day instead of 1-2 large meals a day. ? Be mindful when you eat. Do not  watch television or do other things that might distract you as you eat.  Exercising regularly: ? Make time to exercise each day. If you do not have time for a long workout, do short bouts of exercise for 5-10 minutes several times a day. ? Do some form of strengthening exercise, like weight lifting, and some form of aerobic exercise, like running or swimming.  Drinking plenty of water or other low-calorie or no-calorie drinks. Drink 6-8 glasses of water daily, or as much as instructed by your health care provider.  Summary  Quitting smoking is a physical and mental challenge. You will face cravings, withdrawal symptoms, and temptation to smoke again. Preparation can help you as you go through these challenges.  You can cope with cravings by  keeping your mouth busy (such as by chewing gum), keeping your body and hands busy, and making calls to family, friends, or a helpline for people who want to quit smoking.  You can cope with withdrawal symptoms by avoiding places where people smoke, avoiding drinks with caffeine, and getting plenty of rest.  Ask your health care provider about the different ways to prevent weight gain, avoid stress, and handle social situations. This information is not intended to replace advice given to you by your health care provider. Make sure you discuss any questions you have with your health care provider. Document Released: 09/26/2016 Document Revised: 09/26/2016 Document Reviewed: 09/26/2016 Elsevier Interactive Patient Education  [employer].

## 2017-07-10 NOTE — Progress Notes (Signed)
     [facility] Progress Note  Chief Complaint: GERD  Subjective  History:  This is a 46 year old woman that I last saw in clinic in November 2017. She has a long-standing history of functional right upper quadrant pain with alternating bowel habits and GERD symptoms. She was referred back for worsening GERD symptoms. She reports worsening symptoms of regurgitation and pyrosis, especially at night. She will feel sour and acidic material burning up in her throat and sometimes up in her mouth and nose. She admits to eating late in the evening at times, and she also continues to smoke. Margaret Barker sometimes feels that pills might feel "a little rough" going down, she has no dysphagia to solids or liquids. Her bowel habits are regular, but she does continue to have nausea.  ROS: Cardiovascular:  no chest pain Respiratory: Chronic dyspnea and dry cough  The patient's Past Medical, Family and Social History were reviewed and are on file in the EMR.  Objective:  Med list reviewed  Current Outpatient Prescriptions:  .  busPIRone (BUSPAR) 10 MG tablet, Take 10 mg by mouth 3 (three) times daily. , Disp: , Rfl:  .  DULoxetine (CYMBALTA) 30 MG capsule, , Disp: , Rfl:  .  estradiol (ESTRACE) 2 MG tablet, TAKE 1 TABLET DAILY DAILY FOR 21 DAYS,THEN OFF 7 DAYS, Disp: 30 tablet, Rfl: 5 .  glycopyrrolate (ROBINUL) 2 MG tablet, Take 1 tablet by mouth 2 (two) times daily as needed., Disp: , Rfl:  .  pantoprazole (PROTONIX) 40 MG tablet, Take 1 tablet (40 mg total) by mouth 2 (two) times daily before a meal., Disp: 60 tablet, Rfl: 3 .  traZODone (DESYREL) 100 MG tablet, Take 300 mg by mouth at bedtime as needed for sleep, Disp: , Rfl: 2 .  VESICARE 10 MG tablet, TAKE 1 TABLET (10 MG TOTAL) BY MOUTH EVERY MORNING. REPORTED ON 02/07/2016, Disp: 30 tablet, Rfl: 4 No current facility-administered medications for this visit.   Facility-Administered Medications Ordered in Other Visits:  .   bupivacaine-EPINEPHrine (MARCAINE W/ EPI) 0.25 % (with pres) injection 20 mL, 20 mL, Infiltration, Once, Carolan Clines, MD   Vital signs in last 24 hrs: Vitals:   07/10/17 1057  BP: 90/64  Pulse: 72    Physical Exam    HEENT: sclera anicteric, oral mucosa moist without lesions  Neck: supple, no thyromegaly, JVD or lymphadenopathy  Cardiac: RRR without murmurs, S1S2 heard, no peripheral edema  Pulm: clear to auscultation bilaterally, normal RR and effort noted  Abdomen: soft, No tenderness, with active bowel sounds. No guarding or palpable hepatosplenomegaly.  Skin; warm and dry, no jaundice or rash   @ASSESSMENTPLANBEGIN @ Assessment: Encounter Diagnoses  Name Primary?  . Gastroesophageal reflux disease without esophagitis Yes  . Nausea without vomiting      Plan: We discussed the nature of GERD, and in particular the limitation of antacid medicine. We discussed necessary diet and lifestyle measures, which were reviewed and also given her as written materials today.  I will increase her Protonix to 40 mg twice daily for the next couple of months, and I hope she can make significant efforts to stop smoking. I have told her that is essential for long-term control of GERD as well as to decrease cardiovascular risk.   Total time 15 minutes, over half spent in counseling and coordination of care.   [name]

## 2017-07-17 DIAGNOSIS — Z1231 Encounter for screening mammogram for malignant neoplasm of breast: Secondary | ICD-10-CM | POA: Diagnosis not present

## 2017-07-21 ENCOUNTER — Other Ambulatory Visit: Payer: Self-pay | Admitting: Family Medicine

## 2017-07-22 DIAGNOSIS — D72820 Lymphocytosis (symptomatic): Secondary | ICD-10-CM | POA: Diagnosis not present

## 2017-07-22 DIAGNOSIS — Z716 Tobacco abuse counseling: Secondary | ICD-10-CM | POA: Diagnosis not present

## 2017-07-22 DIAGNOSIS — D729 Disorder of white blood cells, unspecified: Secondary | ICD-10-CM | POA: Diagnosis not present

## 2017-07-22 DIAGNOSIS — Z720 Tobacco use: Secondary | ICD-10-CM | POA: Diagnosis not present

## 2017-07-22 DIAGNOSIS — F410 Panic disorder [episodic paroxysmal anxiety] without agoraphobia: Secondary | ICD-10-CM | POA: Diagnosis not present

## 2017-07-22 DIAGNOSIS — Z8049 Family history of malignant neoplasm of other genital organs: Secondary | ICD-10-CM | POA: Diagnosis not present

## 2017-07-22 DIAGNOSIS — Z800 Family history of malignant neoplasm of digestive organs: Secondary | ICD-10-CM | POA: Diagnosis not present

## 2017-07-22 DIAGNOSIS — Z79899 Other long term (current) drug therapy: Secondary | ICD-10-CM | POA: Diagnosis not present

## 2017-07-22 DIAGNOSIS — F17210 Nicotine dependence, cigarettes, uncomplicated: Secondary | ICD-10-CM | POA: Diagnosis not present

## 2017-07-23 DIAGNOSIS — L748 Other eccrine sweat disorders: Secondary | ICD-10-CM | POA: Diagnosis not present

## 2017-07-29 DIAGNOSIS — N6320 Unspecified lump in the left breast, unspecified quadrant: Secondary | ICD-10-CM | POA: Diagnosis not present

## 2017-07-29 DIAGNOSIS — R928 Other abnormal and inconclusive findings on diagnostic imaging of breast: Secondary | ICD-10-CM | POA: Diagnosis not present

## 2017-07-29 DIAGNOSIS — R921 Mammographic calcification found on diagnostic imaging of breast: Secondary | ICD-10-CM | POA: Diagnosis not present

## 2017-07-29 DIAGNOSIS — N6321 Unspecified lump in the left breast, upper outer quadrant: Secondary | ICD-10-CM | POA: Diagnosis not present

## 2017-07-31 DIAGNOSIS — M533 Sacrococcygeal disorders, not elsewhere classified: Secondary | ICD-10-CM | POA: Diagnosis not present

## 2017-08-05 DIAGNOSIS — N6321 Unspecified lump in the left breast, upper outer quadrant: Secondary | ICD-10-CM | POA: Diagnosis not present

## 2017-08-05 DIAGNOSIS — C50412 Malignant neoplasm of upper-outer quadrant of left female breast: Secondary | ICD-10-CM | POA: Diagnosis not present

## 2017-08-12 DIAGNOSIS — C50912 Malignant neoplasm of unspecified site of left female breast: Secondary | ICD-10-CM | POA: Diagnosis not present

## 2017-08-13 DIAGNOSIS — C50912 Malignant neoplasm of unspecified site of left female breast: Secondary | ICD-10-CM | POA: Diagnosis not present

## 2017-08-13 DIAGNOSIS — Z8042 Family history of malignant neoplasm of prostate: Secondary | ICD-10-CM | POA: Diagnosis not present

## 2017-08-20 DIAGNOSIS — F17200 Nicotine dependence, unspecified, uncomplicated: Secondary | ICD-10-CM | POA: Diagnosis not present

## 2017-08-20 DIAGNOSIS — D72820 Lymphocytosis (symptomatic): Secondary | ICD-10-CM | POA: Diagnosis not present

## 2017-08-20 DIAGNOSIS — N8111 Cystocele, midline: Secondary | ICD-10-CM | POA: Diagnosis not present

## 2017-08-20 DIAGNOSIS — F411 Generalized anxiety disorder: Secondary | ICD-10-CM | POA: Diagnosis not present

## 2017-08-20 DIAGNOSIS — F4310 Post-traumatic stress disorder, unspecified: Secondary | ICD-10-CM | POA: Diagnosis not present

## 2017-08-20 DIAGNOSIS — F410 Panic disorder [episodic paroxysmal anxiety] without agoraphobia: Secondary | ICD-10-CM | POA: Diagnosis not present

## 2017-08-20 DIAGNOSIS — C50412 Malignant neoplasm of upper-outer quadrant of left female breast: Secondary | ICD-10-CM | POA: Diagnosis not present

## 2017-08-20 DIAGNOSIS — R21 Rash and other nonspecific skin eruption: Secondary | ICD-10-CM | POA: Diagnosis not present

## 2017-08-20 DIAGNOSIS — F17210 Nicotine dependence, cigarettes, uncomplicated: Secondary | ICD-10-CM | POA: Diagnosis not present

## 2017-08-20 DIAGNOSIS — Z171 Estrogen receptor negative status [ER-]: Secondary | ICD-10-CM | POA: Diagnosis not present

## 2017-08-28 DIAGNOSIS — F17210 Nicotine dependence, cigarettes, uncomplicated: Secondary | ICD-10-CM | POA: Diagnosis not present

## 2017-08-28 DIAGNOSIS — F17200 Nicotine dependence, unspecified, uncomplicated: Secondary | ICD-10-CM | POA: Diagnosis not present

## 2017-08-28 DIAGNOSIS — F411 Generalized anxiety disorder: Secondary | ICD-10-CM | POA: Diagnosis not present

## 2017-08-28 DIAGNOSIS — C50412 Malignant neoplasm of upper-outer quadrant of left female breast: Secondary | ICD-10-CM | POA: Diagnosis not present

## 2017-08-28 DIAGNOSIS — R21 Rash and other nonspecific skin eruption: Secondary | ICD-10-CM | POA: Diagnosis not present

## 2017-08-28 DIAGNOSIS — Z171 Estrogen receptor negative status [ER-]: Secondary | ICD-10-CM | POA: Diagnosis not present

## 2017-08-28 DIAGNOSIS — F4310 Post-traumatic stress disorder, unspecified: Secondary | ICD-10-CM | POA: Diagnosis not present

## 2017-08-28 DIAGNOSIS — N8111 Cystocele, midline: Secondary | ICD-10-CM | POA: Diagnosis not present

## 2017-08-28 DIAGNOSIS — F410 Panic disorder [episodic paroxysmal anxiety] without agoraphobia: Secondary | ICD-10-CM | POA: Diagnosis not present

## 2017-08-28 DIAGNOSIS — D72820 Lymphocytosis (symptomatic): Secondary | ICD-10-CM | POA: Diagnosis not present

## 2017-09-08 DIAGNOSIS — B354 Tinea corporis: Secondary | ICD-10-CM | POA: Diagnosis not present

## 2017-09-08 DIAGNOSIS — F419 Anxiety disorder, unspecified: Secondary | ICD-10-CM | POA: Diagnosis not present

## 2017-09-11 DIAGNOSIS — Z886 Allergy status to analgesic agent status: Secondary | ICD-10-CM | POA: Diagnosis not present

## 2017-09-11 DIAGNOSIS — Z888 Allergy status to other drugs, medicaments and biological substances status: Secondary | ICD-10-CM | POA: Diagnosis not present

## 2017-09-11 DIAGNOSIS — F332 Major depressive disorder, recurrent severe without psychotic features: Secondary | ICD-10-CM | POA: Diagnosis not present

## 2017-09-16 DIAGNOSIS — Z79899 Other long term (current) drug therapy: Secondary | ICD-10-CM | POA: Diagnosis not present

## 2017-09-16 DIAGNOSIS — D0512 Intraductal carcinoma in situ of left breast: Secondary | ICD-10-CM | POA: Diagnosis not present

## 2017-09-16 DIAGNOSIS — Z888 Allergy status to other drugs, medicaments and biological substances status: Secondary | ICD-10-CM | POA: Diagnosis not present

## 2017-09-16 DIAGNOSIS — Z87891 Personal history of nicotine dependence: Secondary | ICD-10-CM | POA: Diagnosis not present

## 2017-09-16 DIAGNOSIS — C50912 Malignant neoplasm of unspecified site of left female breast: Secondary | ICD-10-CM | POA: Diagnosis not present

## 2017-09-16 DIAGNOSIS — E785 Hyperlipidemia, unspecified: Secondary | ICD-10-CM | POA: Diagnosis not present

## 2017-09-16 DIAGNOSIS — F319 Bipolar disorder, unspecified: Secondary | ICD-10-CM | POA: Diagnosis not present

## 2017-09-16 DIAGNOSIS — Z171 Estrogen receptor negative status [ER-]: Secondary | ICD-10-CM | POA: Diagnosis not present

## 2017-09-16 DIAGNOSIS — F411 Generalized anxiety disorder: Secondary | ICD-10-CM | POA: Diagnosis not present

## 2017-09-16 DIAGNOSIS — K219 Gastro-esophageal reflux disease without esophagitis: Secondary | ICD-10-CM | POA: Diagnosis not present

## 2017-09-16 DIAGNOSIS — N6489 Other specified disorders of breast: Secondary | ICD-10-CM | POA: Diagnosis not present

## 2017-09-25 DIAGNOSIS — Z9889 Other specified postprocedural states: Secondary | ICD-10-CM | POA: Diagnosis not present

## 2017-09-25 DIAGNOSIS — Z5181 Encounter for therapeutic drug level monitoring: Secondary | ICD-10-CM | POA: Diagnosis not present

## 2017-09-25 DIAGNOSIS — C50412 Malignant neoplasm of upper-outer quadrant of left female breast: Secondary | ICD-10-CM | POA: Diagnosis not present

## 2017-09-25 DIAGNOSIS — Z5111 Encounter for antineoplastic chemotherapy: Secondary | ICD-10-CM | POA: Diagnosis not present

## 2017-09-25 DIAGNOSIS — Z8781 Personal history of (healed) traumatic fracture: Secondary | ICD-10-CM | POA: Diagnosis not present

## 2017-09-25 DIAGNOSIS — F410 Panic disorder [episodic paroxysmal anxiety] without agoraphobia: Secondary | ICD-10-CM | POA: Diagnosis not present

## 2017-09-25 DIAGNOSIS — F4310 Post-traumatic stress disorder, unspecified: Secondary | ICD-10-CM | POA: Diagnosis not present

## 2017-09-25 DIAGNOSIS — F17210 Nicotine dependence, cigarettes, uncomplicated: Secondary | ICD-10-CM | POA: Diagnosis not present

## 2017-09-25 DIAGNOSIS — Z171 Estrogen receptor negative status [ER-]: Secondary | ICD-10-CM | POA: Diagnosis not present

## 2017-09-25 DIAGNOSIS — Z79899 Other long term (current) drug therapy: Secondary | ICD-10-CM | POA: Diagnosis not present

## 2017-09-25 DIAGNOSIS — D72820 Lymphocytosis (symptomatic): Secondary | ICD-10-CM | POA: Diagnosis not present

## 2017-09-29 DIAGNOSIS — D0512 Intraductal carcinoma in situ of left breast: Secondary | ICD-10-CM | POA: Diagnosis not present

## 2017-09-30 DIAGNOSIS — C50412 Malignant neoplasm of upper-outer quadrant of left female breast: Secondary | ICD-10-CM | POA: Diagnosis not present

## 2017-09-30 DIAGNOSIS — I071 Rheumatic tricuspid insufficiency: Secondary | ICD-10-CM | POA: Diagnosis not present

## 2017-09-30 DIAGNOSIS — I340 Nonrheumatic mitral (valve) insufficiency: Secondary | ICD-10-CM | POA: Diagnosis not present

## 2017-09-30 DIAGNOSIS — Z5181 Encounter for therapeutic drug level monitoring: Secondary | ICD-10-CM | POA: Diagnosis not present

## 2017-09-30 DIAGNOSIS — C50912 Malignant neoplasm of unspecified site of left female breast: Secondary | ICD-10-CM | POA: Diagnosis not present

## 2017-09-30 DIAGNOSIS — Z79899 Other long term (current) drug therapy: Secondary | ICD-10-CM | POA: Diagnosis not present

## 2017-09-30 DIAGNOSIS — Z171 Estrogen receptor negative status [ER-]: Secondary | ICD-10-CM | POA: Diagnosis not present

## 2017-10-02 DIAGNOSIS — D72820 Lymphocytosis (symptomatic): Secondary | ICD-10-CM | POA: Diagnosis not present

## 2017-10-02 DIAGNOSIS — C50412 Malignant neoplasm of upper-outer quadrant of left female breast: Secondary | ICD-10-CM | POA: Diagnosis not present

## 2017-10-02 DIAGNOSIS — Z171 Estrogen receptor negative status [ER-]: Secondary | ICD-10-CM | POA: Diagnosis not present

## 2017-10-02 DIAGNOSIS — F4310 Post-traumatic stress disorder, unspecified: Secondary | ICD-10-CM | POA: Diagnosis not present

## 2017-10-02 DIAGNOSIS — Z5111 Encounter for antineoplastic chemotherapy: Secondary | ICD-10-CM | POA: Diagnosis not present

## 2017-10-02 DIAGNOSIS — F410 Panic disorder [episodic paroxysmal anxiety] without agoraphobia: Secondary | ICD-10-CM | POA: Diagnosis not present

## 2017-10-05 DIAGNOSIS — Z5111 Encounter for antineoplastic chemotherapy: Secondary | ICD-10-CM | POA: Diagnosis not present

## 2017-10-05 DIAGNOSIS — C50412 Malignant neoplasm of upper-outer quadrant of left female breast: Secondary | ICD-10-CM | POA: Diagnosis not present

## 2017-10-05 DIAGNOSIS — Z171 Estrogen receptor negative status [ER-]: Secondary | ICD-10-CM | POA: Diagnosis not present

## 2017-10-05 DIAGNOSIS — F4310 Post-traumatic stress disorder, unspecified: Secondary | ICD-10-CM | POA: Diagnosis not present

## 2017-10-05 DIAGNOSIS — D72820 Lymphocytosis (symptomatic): Secondary | ICD-10-CM | POA: Diagnosis not present

## 2017-10-05 DIAGNOSIS — F410 Panic disorder [episodic paroxysmal anxiety] without agoraphobia: Secondary | ICD-10-CM | POA: Diagnosis not present

## 2017-10-07 DIAGNOSIS — K219 Gastro-esophageal reflux disease without esophagitis: Secondary | ICD-10-CM | POA: Diagnosis not present

## 2017-10-07 DIAGNOSIS — Z79899 Other long term (current) drug therapy: Secondary | ICD-10-CM | POA: Diagnosis not present

## 2017-10-07 DIAGNOSIS — F319 Bipolar disorder, unspecified: Secondary | ICD-10-CM | POA: Diagnosis not present

## 2017-10-07 DIAGNOSIS — D0512 Intraductal carcinoma in situ of left breast: Secondary | ICD-10-CM | POA: Diagnosis not present

## 2017-10-07 DIAGNOSIS — C50912 Malignant neoplasm of unspecified site of left female breast: Secondary | ICD-10-CM | POA: Diagnosis not present

## 2017-10-07 DIAGNOSIS — Z885 Allergy status to narcotic agent status: Secondary | ICD-10-CM | POA: Diagnosis not present

## 2017-10-07 DIAGNOSIS — Z171 Estrogen receptor negative status [ER-]: Secondary | ICD-10-CM | POA: Diagnosis not present

## 2017-10-07 DIAGNOSIS — F4310 Post-traumatic stress disorder, unspecified: Secondary | ICD-10-CM | POA: Diagnosis not present

## 2017-10-07 DIAGNOSIS — E785 Hyperlipidemia, unspecified: Secondary | ICD-10-CM | POA: Diagnosis not present

## 2017-10-07 DIAGNOSIS — Z888 Allergy status to other drugs, medicaments and biological substances status: Secondary | ICD-10-CM | POA: Diagnosis not present

## 2017-10-07 DIAGNOSIS — Z87891 Personal history of nicotine dependence: Secondary | ICD-10-CM | POA: Diagnosis not present

## 2017-10-09 DIAGNOSIS — F410 Panic disorder [episodic paroxysmal anxiety] without agoraphobia: Secondary | ICD-10-CM | POA: Diagnosis not present

## 2017-10-09 DIAGNOSIS — F4310 Post-traumatic stress disorder, unspecified: Secondary | ICD-10-CM | POA: Diagnosis not present

## 2017-10-09 DIAGNOSIS — Z5111 Encounter for antineoplastic chemotherapy: Secondary | ICD-10-CM | POA: Diagnosis not present

## 2017-10-09 DIAGNOSIS — Z171 Estrogen receptor negative status [ER-]: Secondary | ICD-10-CM | POA: Diagnosis not present

## 2017-10-09 DIAGNOSIS — D72820 Lymphocytosis (symptomatic): Secondary | ICD-10-CM | POA: Diagnosis not present

## 2017-10-09 DIAGNOSIS — C50412 Malignant neoplasm of upper-outer quadrant of left female breast: Secondary | ICD-10-CM | POA: Diagnosis not present

## 2017-10-14 DIAGNOSIS — Z9889 Other specified postprocedural states: Secondary | ICD-10-CM | POA: Diagnosis not present

## 2017-10-16 DIAGNOSIS — C50412 Malignant neoplasm of upper-outer quadrant of left female breast: Secondary | ICD-10-CM | POA: Diagnosis not present

## 2017-10-16 DIAGNOSIS — Z171 Estrogen receptor negative status [ER-]: Secondary | ICD-10-CM | POA: Diagnosis not present

## 2017-10-16 DIAGNOSIS — Z5111 Encounter for antineoplastic chemotherapy: Secondary | ICD-10-CM | POA: Diagnosis not present

## 2017-10-16 DIAGNOSIS — F332 Major depressive disorder, recurrent severe without psychotic features: Secondary | ICD-10-CM | POA: Diagnosis not present

## 2017-10-16 DIAGNOSIS — R110 Nausea: Secondary | ICD-10-CM | POA: Diagnosis not present

## 2017-10-16 DIAGNOSIS — D72820 Lymphocytosis (symptomatic): Secondary | ICD-10-CM | POA: Diagnosis not present

## 2017-10-23 DIAGNOSIS — Z5111 Encounter for antineoplastic chemotherapy: Secondary | ICD-10-CM | POA: Diagnosis not present

## 2017-10-23 DIAGNOSIS — Z79899 Other long term (current) drug therapy: Secondary | ICD-10-CM | POA: Diagnosis not present

## 2017-10-23 DIAGNOSIS — Z171 Estrogen receptor negative status [ER-]: Secondary | ICD-10-CM | POA: Diagnosis not present

## 2017-10-23 DIAGNOSIS — F332 Major depressive disorder, recurrent severe without psychotic features: Secondary | ICD-10-CM | POA: Diagnosis not present

## 2017-10-23 DIAGNOSIS — R110 Nausea: Secondary | ICD-10-CM | POA: Diagnosis not present

## 2017-10-23 DIAGNOSIS — D72820 Lymphocytosis (symptomatic): Secondary | ICD-10-CM | POA: Diagnosis not present

## 2017-10-23 DIAGNOSIS — C50412 Malignant neoplasm of upper-outer quadrant of left female breast: Secondary | ICD-10-CM | POA: Diagnosis not present

## 2017-10-23 DIAGNOSIS — Z5181 Encounter for therapeutic drug level monitoring: Secondary | ICD-10-CM | POA: Diagnosis not present

## 2017-10-30 DIAGNOSIS — D72820 Lymphocytosis (symptomatic): Secondary | ICD-10-CM | POA: Diagnosis not present

## 2017-10-30 DIAGNOSIS — Z171 Estrogen receptor negative status [ER-]: Secondary | ICD-10-CM | POA: Diagnosis not present

## 2017-10-30 DIAGNOSIS — F332 Major depressive disorder, recurrent severe without psychotic features: Secondary | ICD-10-CM | POA: Diagnosis not present

## 2017-10-30 DIAGNOSIS — Z5111 Encounter for antineoplastic chemotherapy: Secondary | ICD-10-CM | POA: Diagnosis not present

## 2017-10-30 DIAGNOSIS — R110 Nausea: Secondary | ICD-10-CM | POA: Diagnosis not present

## 2017-10-30 DIAGNOSIS — C50412 Malignant neoplasm of upper-outer quadrant of left female breast: Secondary | ICD-10-CM | POA: Diagnosis not present

## 2017-11-03 DIAGNOSIS — F329 Major depressive disorder, single episode, unspecified: Secondary | ICD-10-CM | POA: Diagnosis not present

## 2017-11-06 DIAGNOSIS — Z5111 Encounter for antineoplastic chemotherapy: Secondary | ICD-10-CM | POA: Diagnosis not present

## 2017-11-06 DIAGNOSIS — R110 Nausea: Secondary | ICD-10-CM | POA: Diagnosis not present

## 2017-11-06 DIAGNOSIS — D72820 Lymphocytosis (symptomatic): Secondary | ICD-10-CM | POA: Diagnosis not present

## 2017-11-06 DIAGNOSIS — Z5181 Encounter for therapeutic drug level monitoring: Secondary | ICD-10-CM | POA: Diagnosis not present

## 2017-11-06 DIAGNOSIS — C50412 Malignant neoplasm of upper-outer quadrant of left female breast: Secondary | ICD-10-CM | POA: Diagnosis not present

## 2017-11-06 DIAGNOSIS — F332 Major depressive disorder, recurrent severe without psychotic features: Secondary | ICD-10-CM | POA: Diagnosis not present

## 2017-11-06 DIAGNOSIS — Z79899 Other long term (current) drug therapy: Secondary | ICD-10-CM | POA: Diagnosis not present

## 2017-11-06 DIAGNOSIS — Z171 Estrogen receptor negative status [ER-]: Secondary | ICD-10-CM | POA: Diagnosis not present

## 2017-11-17 ENCOUNTER — Other Ambulatory Visit: Payer: Self-pay | Admitting: Gastroenterology

## 2017-11-18 DIAGNOSIS — K649 Unspecified hemorrhoids: Secondary | ICD-10-CM | POA: Diagnosis not present

## 2017-11-20 DIAGNOSIS — Z171 Estrogen receptor negative status [ER-]: Secondary | ICD-10-CM | POA: Diagnosis not present

## 2017-11-20 DIAGNOSIS — K5900 Constipation, unspecified: Secondary | ICD-10-CM | POA: Diagnosis not present

## 2017-11-20 DIAGNOSIS — R110 Nausea: Secondary | ICD-10-CM | POA: Diagnosis not present

## 2017-11-20 DIAGNOSIS — D701 Agranulocytosis secondary to cancer chemotherapy: Secondary | ICD-10-CM | POA: Diagnosis not present

## 2017-11-20 DIAGNOSIS — I959 Hypotension, unspecified: Secondary | ICD-10-CM | POA: Diagnosis not present

## 2017-11-20 DIAGNOSIS — Z9889 Other specified postprocedural states: Secondary | ICD-10-CM | POA: Diagnosis not present

## 2017-11-20 DIAGNOSIS — T451X5D Adverse effect of antineoplastic and immunosuppressive drugs, subsequent encounter: Secondary | ICD-10-CM | POA: Diagnosis not present

## 2017-11-20 DIAGNOSIS — D6481 Anemia due to antineoplastic chemotherapy: Secondary | ICD-10-CM | POA: Diagnosis not present

## 2017-11-20 DIAGNOSIS — Z5111 Encounter for antineoplastic chemotherapy: Secondary | ICD-10-CM | POA: Diagnosis not present

## 2017-11-20 DIAGNOSIS — Z87891 Personal history of nicotine dependence: Secondary | ICD-10-CM | POA: Diagnosis not present

## 2017-11-20 DIAGNOSIS — Z862 Personal history of diseases of the blood and blood-forming organs and certain disorders involving the immune mechanism: Secondary | ICD-10-CM | POA: Diagnosis not present

## 2017-11-20 DIAGNOSIS — T451X5A Adverse effect of antineoplastic and immunosuppressive drugs, initial encounter: Secondary | ICD-10-CM | POA: Diagnosis not present

## 2017-11-20 DIAGNOSIS — C50412 Malignant neoplasm of upper-outer quadrant of left female breast: Secondary | ICD-10-CM | POA: Diagnosis not present

## 2017-11-20 DIAGNOSIS — D649 Anemia, unspecified: Secondary | ICD-10-CM | POA: Diagnosis not present

## 2017-11-20 DIAGNOSIS — K1230 Oral mucositis (ulcerative), unspecified: Secondary | ICD-10-CM | POA: Diagnosis not present

## 2017-11-27 DIAGNOSIS — T451X5A Adverse effect of antineoplastic and immunosuppressive drugs, initial encounter: Secondary | ICD-10-CM | POA: Diagnosis not present

## 2017-11-27 DIAGNOSIS — D649 Anemia, unspecified: Secondary | ICD-10-CM | POA: Diagnosis not present

## 2017-11-27 DIAGNOSIS — C50412 Malignant neoplasm of upper-outer quadrant of left female breast: Secondary | ICD-10-CM | POA: Diagnosis not present

## 2017-11-27 DIAGNOSIS — D701 Agranulocytosis secondary to cancer chemotherapy: Secondary | ICD-10-CM | POA: Diagnosis not present

## 2017-11-27 DIAGNOSIS — Z171 Estrogen receptor negative status [ER-]: Secondary | ICD-10-CM | POA: Diagnosis not present

## 2017-11-27 DIAGNOSIS — Z5111 Encounter for antineoplastic chemotherapy: Secondary | ICD-10-CM | POA: Diagnosis not present

## 2017-12-04 DIAGNOSIS — Z5111 Encounter for antineoplastic chemotherapy: Secondary | ICD-10-CM | POA: Diagnosis not present

## 2017-12-04 DIAGNOSIS — Z862 Personal history of diseases of the blood and blood-forming organs and certain disorders involving the immune mechanism: Secondary | ICD-10-CM | POA: Diagnosis not present

## 2017-12-04 DIAGNOSIS — D6481 Anemia due to antineoplastic chemotherapy: Secondary | ICD-10-CM | POA: Diagnosis not present

## 2017-12-04 DIAGNOSIS — D701 Agranulocytosis secondary to cancer chemotherapy: Secondary | ICD-10-CM | POA: Diagnosis not present

## 2017-12-04 DIAGNOSIS — K1230 Oral mucositis (ulcerative), unspecified: Secondary | ICD-10-CM | POA: Diagnosis not present

## 2017-12-04 DIAGNOSIS — K5900 Constipation, unspecified: Secondary | ICD-10-CM | POA: Diagnosis not present

## 2017-12-04 DIAGNOSIS — Z9889 Other specified postprocedural states: Secondary | ICD-10-CM | POA: Diagnosis not present

## 2017-12-04 DIAGNOSIS — R110 Nausea: Secondary | ICD-10-CM | POA: Diagnosis not present

## 2017-12-04 DIAGNOSIS — Z87891 Personal history of nicotine dependence: Secondary | ICD-10-CM | POA: Diagnosis not present

## 2017-12-04 DIAGNOSIS — T451X5A Adverse effect of antineoplastic and immunosuppressive drugs, initial encounter: Secondary | ICD-10-CM | POA: Diagnosis not present

## 2017-12-04 DIAGNOSIS — C50412 Malignant neoplasm of upper-outer quadrant of left female breast: Secondary | ICD-10-CM | POA: Diagnosis not present

## 2017-12-04 DIAGNOSIS — Z171 Estrogen receptor negative status [ER-]: Secondary | ICD-10-CM | POA: Diagnosis not present

## 2017-12-04 DIAGNOSIS — T451X5D Adverse effect of antineoplastic and immunosuppressive drugs, subsequent encounter: Secondary | ICD-10-CM | POA: Diagnosis not present

## 2017-12-04 DIAGNOSIS — I959 Hypotension, unspecified: Secondary | ICD-10-CM | POA: Diagnosis not present

## 2017-12-04 DIAGNOSIS — D649 Anemia, unspecified: Secondary | ICD-10-CM | POA: Diagnosis not present

## 2017-12-11 DIAGNOSIS — Z171 Estrogen receptor negative status [ER-]: Secondary | ICD-10-CM | POA: Diagnosis not present

## 2017-12-11 DIAGNOSIS — Z5111 Encounter for antineoplastic chemotherapy: Secondary | ICD-10-CM | POA: Diagnosis not present

## 2017-12-11 DIAGNOSIS — C50412 Malignant neoplasm of upper-outer quadrant of left female breast: Secondary | ICD-10-CM | POA: Diagnosis not present

## 2017-12-16 DIAGNOSIS — N6489 Other specified disorders of breast: Secondary | ICD-10-CM | POA: Diagnosis not present

## 2017-12-18 DIAGNOSIS — Z171 Estrogen receptor negative status [ER-]: Secondary | ICD-10-CM | POA: Diagnosis not present

## 2017-12-18 DIAGNOSIS — C50412 Malignant neoplasm of upper-outer quadrant of left female breast: Secondary | ICD-10-CM | POA: Diagnosis not present

## 2017-12-18 DIAGNOSIS — Z5111 Encounter for antineoplastic chemotherapy: Secondary | ICD-10-CM | POA: Diagnosis not present

## 2017-12-25 DIAGNOSIS — Z79899 Other long term (current) drug therapy: Secondary | ICD-10-CM | POA: Diagnosis not present

## 2017-12-25 DIAGNOSIS — Z5181 Encounter for therapeutic drug level monitoring: Secondary | ICD-10-CM | POA: Diagnosis not present

## 2017-12-25 DIAGNOSIS — Z5111 Encounter for antineoplastic chemotherapy: Secondary | ICD-10-CM | POA: Diagnosis not present

## 2017-12-25 DIAGNOSIS — Z87891 Personal history of nicotine dependence: Secondary | ICD-10-CM | POA: Diagnosis not present

## 2017-12-25 DIAGNOSIS — G2581 Restless legs syndrome: Secondary | ICD-10-CM | POA: Diagnosis not present

## 2017-12-25 DIAGNOSIS — Z171 Estrogen receptor negative status [ER-]: Secondary | ICD-10-CM | POA: Diagnosis not present

## 2017-12-25 DIAGNOSIS — D6481 Anemia due to antineoplastic chemotherapy: Secondary | ICD-10-CM | POA: Diagnosis not present

## 2017-12-25 DIAGNOSIS — Z1379 Encounter for other screening for genetic and chromosomal anomalies: Secondary | ICD-10-CM | POA: Diagnosis not present

## 2017-12-25 DIAGNOSIS — Z9889 Other specified postprocedural states: Secondary | ICD-10-CM | POA: Diagnosis not present

## 2017-12-25 DIAGNOSIS — C50412 Malignant neoplasm of upper-outer quadrant of left female breast: Secondary | ICD-10-CM | POA: Diagnosis not present

## 2017-12-25 DIAGNOSIS — T451X5D Adverse effect of antineoplastic and immunosuppressive drugs, subsequent encounter: Secondary | ICD-10-CM | POA: Diagnosis not present

## 2018-01-01 DIAGNOSIS — C50412 Malignant neoplasm of upper-outer quadrant of left female breast: Secondary | ICD-10-CM | POA: Diagnosis not present

## 2018-01-01 DIAGNOSIS — Z5111 Encounter for antineoplastic chemotherapy: Secondary | ICD-10-CM | POA: Diagnosis not present

## 2018-01-01 DIAGNOSIS — Z171 Estrogen receptor negative status [ER-]: Secondary | ICD-10-CM | POA: Diagnosis not present

## 2018-01-08 DIAGNOSIS — Z171 Estrogen receptor negative status [ER-]: Secondary | ICD-10-CM | POA: Diagnosis not present

## 2018-01-08 DIAGNOSIS — Z5111 Encounter for antineoplastic chemotherapy: Secondary | ICD-10-CM | POA: Diagnosis not present

## 2018-01-08 DIAGNOSIS — C50412 Malignant neoplasm of upper-outer quadrant of left female breast: Secondary | ICD-10-CM | POA: Diagnosis not present

## 2018-01-15 DIAGNOSIS — F4310 Post-traumatic stress disorder, unspecified: Secondary | ICD-10-CM | POA: Diagnosis not present

## 2018-01-15 DIAGNOSIS — Z5111 Encounter for antineoplastic chemotherapy: Secondary | ICD-10-CM | POA: Diagnosis not present

## 2018-01-15 DIAGNOSIS — R918 Other nonspecific abnormal finding of lung field: Secondary | ICD-10-CM | POA: Diagnosis not present

## 2018-01-15 DIAGNOSIS — N393 Stress incontinence (female) (male): Secondary | ICD-10-CM | POA: Diagnosis not present

## 2018-01-15 DIAGNOSIS — G620 Drug-induced polyneuropathy: Secondary | ICD-10-CM | POA: Diagnosis not present

## 2018-01-15 DIAGNOSIS — C50412 Malignant neoplasm of upper-outer quadrant of left female breast: Secondary | ICD-10-CM | POA: Diagnosis not present

## 2018-01-15 DIAGNOSIS — Z5181 Encounter for therapeutic drug level monitoring: Secondary | ICD-10-CM | POA: Diagnosis not present

## 2018-01-15 DIAGNOSIS — T451X5A Adverse effect of antineoplastic and immunosuppressive drugs, initial encounter: Secondary | ICD-10-CM | POA: Diagnosis not present

## 2018-01-15 DIAGNOSIS — Z171 Estrogen receptor negative status [ER-]: Secondary | ICD-10-CM | POA: Diagnosis not present

## 2018-01-15 DIAGNOSIS — Z9889 Other specified postprocedural states: Secondary | ICD-10-CM | POA: Diagnosis not present

## 2018-01-15 DIAGNOSIS — D649 Anemia, unspecified: Secondary | ICD-10-CM | POA: Diagnosis not present

## 2018-01-15 DIAGNOSIS — Z87891 Personal history of nicotine dependence: Secondary | ICD-10-CM | POA: Diagnosis not present

## 2018-01-15 DIAGNOSIS — Z79899 Other long term (current) drug therapy: Secondary | ICD-10-CM | POA: Diagnosis not present

## 2018-01-22 DIAGNOSIS — D649 Anemia, unspecified: Secondary | ICD-10-CM | POA: Diagnosis not present

## 2018-01-22 DIAGNOSIS — D608 Other acquired pure red cell aplasias: Secondary | ICD-10-CM | POA: Diagnosis not present

## 2018-01-22 DIAGNOSIS — C50412 Malignant neoplasm of upper-outer quadrant of left female breast: Secondary | ICD-10-CM | POA: Diagnosis not present

## 2018-01-22 DIAGNOSIS — Z9889 Other specified postprocedural states: Secondary | ICD-10-CM | POA: Diagnosis not present

## 2018-01-22 DIAGNOSIS — D72821 Monocytosis (symptomatic): Secondary | ICD-10-CM | POA: Diagnosis not present

## 2018-01-22 DIAGNOSIS — Z5181 Encounter for therapeutic drug level monitoring: Secondary | ICD-10-CM | POA: Diagnosis not present

## 2018-01-22 DIAGNOSIS — Z171 Estrogen receptor negative status [ER-]: Secondary | ICD-10-CM | POA: Diagnosis not present

## 2018-01-22 DIAGNOSIS — Z5111 Encounter for antineoplastic chemotherapy: Secondary | ICD-10-CM | POA: Diagnosis not present

## 2018-01-22 DIAGNOSIS — G620 Drug-induced polyneuropathy: Secondary | ICD-10-CM | POA: Diagnosis not present

## 2018-01-22 DIAGNOSIS — Z79899 Other long term (current) drug therapy: Secondary | ICD-10-CM | POA: Diagnosis not present

## 2018-01-22 DIAGNOSIS — T451X5A Adverse effect of antineoplastic and immunosuppressive drugs, initial encounter: Secondary | ICD-10-CM | POA: Diagnosis not present

## 2018-01-22 DIAGNOSIS — D72820 Lymphocytosis (symptomatic): Secondary | ICD-10-CM | POA: Diagnosis not present

## 2018-01-28 DIAGNOSIS — Z5111 Encounter for antineoplastic chemotherapy: Secondary | ICD-10-CM | POA: Diagnosis not present

## 2018-01-28 DIAGNOSIS — C50412 Malignant neoplasm of upper-outer quadrant of left female breast: Secondary | ICD-10-CM | POA: Diagnosis not present

## 2018-01-28 DIAGNOSIS — Z171 Estrogen receptor negative status [ER-]: Secondary | ICD-10-CM | POA: Diagnosis not present

## 2018-01-28 DIAGNOSIS — G620 Drug-induced polyneuropathy: Secondary | ICD-10-CM | POA: Diagnosis not present

## 2018-01-28 DIAGNOSIS — D649 Anemia, unspecified: Secondary | ICD-10-CM | POA: Diagnosis not present

## 2018-01-28 DIAGNOSIS — T451X5A Adverse effect of antineoplastic and immunosuppressive drugs, initial encounter: Secondary | ICD-10-CM | POA: Diagnosis not present

## 2018-02-05 DIAGNOSIS — Z171 Estrogen receptor negative status [ER-]: Secondary | ICD-10-CM | POA: Diagnosis not present

## 2018-02-05 DIAGNOSIS — C50412 Malignant neoplasm of upper-outer quadrant of left female breast: Secondary | ICD-10-CM | POA: Diagnosis not present

## 2018-02-05 DIAGNOSIS — D649 Anemia, unspecified: Secondary | ICD-10-CM | POA: Diagnosis not present

## 2018-02-05 DIAGNOSIS — T451X5A Adverse effect of antineoplastic and immunosuppressive drugs, initial encounter: Secondary | ICD-10-CM | POA: Diagnosis not present

## 2018-02-05 DIAGNOSIS — Z5111 Encounter for antineoplastic chemotherapy: Secondary | ICD-10-CM | POA: Diagnosis not present

## 2018-02-05 DIAGNOSIS — G620 Drug-induced polyneuropathy: Secondary | ICD-10-CM | POA: Diagnosis not present

## 2018-02-08 DIAGNOSIS — G620 Drug-induced polyneuropathy: Secondary | ICD-10-CM | POA: Diagnosis not present

## 2018-02-08 DIAGNOSIS — Z8042 Family history of malignant neoplasm of prostate: Secondary | ICD-10-CM | POA: Diagnosis not present

## 2018-02-08 DIAGNOSIS — D649 Anemia, unspecified: Secondary | ICD-10-CM | POA: Diagnosis not present

## 2018-02-08 DIAGNOSIS — Z79899 Other long term (current) drug therapy: Secondary | ICD-10-CM | POA: Diagnosis not present

## 2018-02-08 DIAGNOSIS — Z9889 Other specified postprocedural states: Secondary | ICD-10-CM | POA: Diagnosis not present

## 2018-02-08 DIAGNOSIS — Z5181 Encounter for therapeutic drug level monitoring: Secondary | ICD-10-CM | POA: Diagnosis not present

## 2018-02-08 DIAGNOSIS — T451X5A Adverse effect of antineoplastic and immunosuppressive drugs, initial encounter: Secondary | ICD-10-CM | POA: Diagnosis not present

## 2018-02-08 DIAGNOSIS — N61 Mastitis without abscess: Secondary | ICD-10-CM | POA: Diagnosis not present

## 2018-02-08 DIAGNOSIS — Z800 Family history of malignant neoplasm of digestive organs: Secondary | ICD-10-CM | POA: Diagnosis not present

## 2018-02-08 DIAGNOSIS — Z5111 Encounter for antineoplastic chemotherapy: Secondary | ICD-10-CM | POA: Diagnosis not present

## 2018-02-08 DIAGNOSIS — Z808 Family history of malignant neoplasm of other organs or systems: Secondary | ICD-10-CM | POA: Diagnosis not present

## 2018-02-08 DIAGNOSIS — C50412 Malignant neoplasm of upper-outer quadrant of left female breast: Secondary | ICD-10-CM | POA: Diagnosis not present

## 2018-02-08 DIAGNOSIS — Z171 Estrogen receptor negative status [ER-]: Secondary | ICD-10-CM | POA: Diagnosis not present

## 2018-02-08 DIAGNOSIS — Z8049 Family history of malignant neoplasm of other genital organs: Secondary | ICD-10-CM | POA: Diagnosis not present

## 2018-02-08 DIAGNOSIS — Z87891 Personal history of nicotine dependence: Secondary | ICD-10-CM | POA: Diagnosis not present

## 2018-02-12 DIAGNOSIS — T451X5A Adverse effect of antineoplastic and immunosuppressive drugs, initial encounter: Secondary | ICD-10-CM | POA: Diagnosis not present

## 2018-02-12 DIAGNOSIS — N6489 Other specified disorders of breast: Secondary | ICD-10-CM | POA: Diagnosis not present

## 2018-02-12 DIAGNOSIS — Z5181 Encounter for therapeutic drug level monitoring: Secondary | ICD-10-CM | POA: Diagnosis not present

## 2018-02-12 DIAGNOSIS — Z79899 Other long term (current) drug therapy: Secondary | ICD-10-CM | POA: Diagnosis not present

## 2018-02-12 DIAGNOSIS — N61 Mastitis without abscess: Secondary | ICD-10-CM | POA: Diagnosis not present

## 2018-02-12 DIAGNOSIS — Z9889 Other specified postprocedural states: Secondary | ICD-10-CM | POA: Diagnosis not present

## 2018-02-12 DIAGNOSIS — D649 Anemia, unspecified: Secondary | ICD-10-CM | POA: Diagnosis not present

## 2018-02-12 DIAGNOSIS — Z171 Estrogen receptor negative status [ER-]: Secondary | ICD-10-CM | POA: Diagnosis not present

## 2018-02-12 DIAGNOSIS — Z1379 Encounter for other screening for genetic and chromosomal anomalies: Secondary | ICD-10-CM | POA: Diagnosis not present

## 2018-02-12 DIAGNOSIS — G620 Drug-induced polyneuropathy: Secondary | ICD-10-CM | POA: Diagnosis not present

## 2018-02-12 DIAGNOSIS — C50412 Malignant neoplasm of upper-outer quadrant of left female breast: Secondary | ICD-10-CM | POA: Diagnosis not present

## 2018-02-12 DIAGNOSIS — Z5111 Encounter for antineoplastic chemotherapy: Secondary | ICD-10-CM | POA: Diagnosis not present

## 2018-02-15 DIAGNOSIS — N6489 Other specified disorders of breast: Secondary | ICD-10-CM | POA: Diagnosis not present

## 2018-02-17 DIAGNOSIS — Z79899 Other long term (current) drug therapy: Secondary | ICD-10-CM | POA: Diagnosis not present

## 2018-02-17 DIAGNOSIS — F319 Bipolar disorder, unspecified: Secondary | ICD-10-CM | POA: Diagnosis not present

## 2018-02-17 DIAGNOSIS — L7634 Postprocedural seroma of skin and subcutaneous tissue following other procedure: Secondary | ICD-10-CM | POA: Diagnosis not present

## 2018-02-17 DIAGNOSIS — Z853 Personal history of malignant neoplasm of breast: Secondary | ICD-10-CM | POA: Diagnosis not present

## 2018-02-17 DIAGNOSIS — F411 Generalized anxiety disorder: Secondary | ICD-10-CM | POA: Diagnosis not present

## 2018-02-17 DIAGNOSIS — Z886 Allergy status to analgesic agent status: Secondary | ICD-10-CM | POA: Diagnosis not present

## 2018-02-17 DIAGNOSIS — E785 Hyperlipidemia, unspecified: Secondary | ICD-10-CM | POA: Diagnosis not present

## 2018-02-17 DIAGNOSIS — Z888 Allergy status to other drugs, medicaments and biological substances status: Secondary | ICD-10-CM | POA: Diagnosis not present

## 2018-02-17 DIAGNOSIS — K219 Gastro-esophageal reflux disease without esophagitis: Secondary | ICD-10-CM | POA: Diagnosis not present

## 2018-02-17 DIAGNOSIS — N6489 Other specified disorders of breast: Secondary | ICD-10-CM | POA: Diagnosis not present

## 2018-02-17 DIAGNOSIS — F4310 Post-traumatic stress disorder, unspecified: Secondary | ICD-10-CM | POA: Diagnosis not present

## 2018-02-17 DIAGNOSIS — Z87891 Personal history of nicotine dependence: Secondary | ICD-10-CM | POA: Diagnosis not present

## 2018-02-24 DIAGNOSIS — Z9889 Other specified postprocedural states: Secondary | ICD-10-CM | POA: Diagnosis not present

## 2018-02-26 DIAGNOSIS — T451X5A Adverse effect of antineoplastic and immunosuppressive drugs, initial encounter: Secondary | ICD-10-CM | POA: Diagnosis not present

## 2018-02-26 DIAGNOSIS — G620 Drug-induced polyneuropathy: Secondary | ICD-10-CM | POA: Diagnosis not present

## 2018-02-26 DIAGNOSIS — Z5111 Encounter for antineoplastic chemotherapy: Secondary | ICD-10-CM | POA: Diagnosis not present

## 2018-02-26 DIAGNOSIS — Z79899 Other long term (current) drug therapy: Secondary | ICD-10-CM | POA: Diagnosis not present

## 2018-02-26 DIAGNOSIS — Z171 Estrogen receptor negative status [ER-]: Secondary | ICD-10-CM | POA: Diagnosis not present

## 2018-02-26 DIAGNOSIS — C50412 Malignant neoplasm of upper-outer quadrant of left female breast: Secondary | ICD-10-CM | POA: Diagnosis not present

## 2018-03-01 DIAGNOSIS — N6489 Other specified disorders of breast: Secondary | ICD-10-CM | POA: Diagnosis not present

## 2018-03-02 DIAGNOSIS — F329 Major depressive disorder, single episode, unspecified: Secondary | ICD-10-CM | POA: Diagnosis not present

## 2018-03-05 DIAGNOSIS — Z5111 Encounter for antineoplastic chemotherapy: Secondary | ICD-10-CM | POA: Diagnosis not present

## 2018-03-05 DIAGNOSIS — F4310 Post-traumatic stress disorder, unspecified: Secondary | ICD-10-CM | POA: Diagnosis not present

## 2018-03-05 DIAGNOSIS — R918 Other nonspecific abnormal finding of lung field: Secondary | ICD-10-CM | POA: Diagnosis not present

## 2018-03-05 DIAGNOSIS — T451X5A Adverse effect of antineoplastic and immunosuppressive drugs, initial encounter: Secondary | ICD-10-CM | POA: Diagnosis not present

## 2018-03-05 DIAGNOSIS — Z9889 Other specified postprocedural states: Secondary | ICD-10-CM | POA: Diagnosis not present

## 2018-03-05 DIAGNOSIS — Z79899 Other long term (current) drug therapy: Secondary | ICD-10-CM | POA: Diagnosis not present

## 2018-03-05 DIAGNOSIS — Z1379 Encounter for other screening for genetic and chromosomal anomalies: Secondary | ICD-10-CM | POA: Diagnosis not present

## 2018-03-05 DIAGNOSIS — Z171 Estrogen receptor negative status [ER-]: Secondary | ICD-10-CM | POA: Diagnosis not present

## 2018-03-05 DIAGNOSIS — G620 Drug-induced polyneuropathy: Secondary | ICD-10-CM | POA: Diagnosis not present

## 2018-03-05 DIAGNOSIS — Z5181 Encounter for therapeutic drug level monitoring: Secondary | ICD-10-CM | POA: Diagnosis not present

## 2018-03-05 DIAGNOSIS — C50412 Malignant neoplasm of upper-outer quadrant of left female breast: Secondary | ICD-10-CM | POA: Diagnosis not present

## 2018-03-12 ENCOUNTER — Other Ambulatory Visit: Payer: Self-pay | Admitting: Gastroenterology

## 2018-03-15 ENCOUNTER — Other Ambulatory Visit: Payer: Self-pay

## 2018-03-15 MED ORDER — PANTOPRAZOLE SODIUM 40 MG PO TBEC: 40.0000 mg | DELAYED_RELEASE_TABLET | Freq: Two times a day (BID) | ORAL | 3 refills | Status: AC

## 2018-03-17 DIAGNOSIS — Z9889 Other specified postprocedural states: Secondary | ICD-10-CM | POA: Diagnosis not present

## 2018-03-17 DIAGNOSIS — N6489 Other specified disorders of breast: Secondary | ICD-10-CM | POA: Diagnosis not present

## 2018-03-24 DIAGNOSIS — Z9889 Other specified postprocedural states: Secondary | ICD-10-CM | POA: Diagnosis not present

## 2018-03-24 DIAGNOSIS — C50912 Malignant neoplasm of unspecified site of left female breast: Secondary | ICD-10-CM | POA: Diagnosis not present

## 2018-04-01 DIAGNOSIS — Z452 Encounter for adjustment and management of vascular access device: Secondary | ICD-10-CM | POA: Diagnosis not present

## 2018-04-06 DIAGNOSIS — Z808 Family history of malignant neoplasm of other organs or systems: Secondary | ICD-10-CM | POA: Diagnosis not present

## 2018-04-06 DIAGNOSIS — C50412 Malignant neoplasm of upper-outer quadrant of left female breast: Secondary | ICD-10-CM | POA: Diagnosis not present

## 2018-04-06 DIAGNOSIS — Z9889 Other specified postprocedural states: Secondary | ICD-10-CM | POA: Diagnosis not present

## 2018-04-06 DIAGNOSIS — Z9221 Personal history of antineoplastic chemotherapy: Secondary | ICD-10-CM | POA: Diagnosis not present

## 2018-04-06 DIAGNOSIS — Z171 Estrogen receptor negative status [ER-]: Secondary | ICD-10-CM | POA: Diagnosis not present

## 2018-04-06 DIAGNOSIS — R030 Elevated blood-pressure reading, without diagnosis of hypertension: Secondary | ICD-10-CM | POA: Diagnosis not present

## 2018-04-07 DIAGNOSIS — Z171 Estrogen receptor negative status [ER-]: Secondary | ICD-10-CM | POA: Diagnosis not present

## 2018-04-07 DIAGNOSIS — C50412 Malignant neoplasm of upper-outer quadrant of left female breast: Secondary | ICD-10-CM | POA: Diagnosis not present

## 2018-04-20 DIAGNOSIS — C50412 Malignant neoplasm of upper-outer quadrant of left female breast: Secondary | ICD-10-CM | POA: Diagnosis not present

## 2018-04-20 DIAGNOSIS — Z510 Encounter for antineoplastic radiation therapy: Secondary | ICD-10-CM | POA: Diagnosis not present

## 2018-04-21 DIAGNOSIS — Z510 Encounter for antineoplastic radiation therapy: Secondary | ICD-10-CM | POA: Diagnosis not present

## 2018-04-21 DIAGNOSIS — C50412 Malignant neoplasm of upper-outer quadrant of left female breast: Secondary | ICD-10-CM | POA: Diagnosis not present

## 2018-04-26 DIAGNOSIS — C50412 Malignant neoplasm of upper-outer quadrant of left female breast: Secondary | ICD-10-CM | POA: Diagnosis not present

## 2018-04-26 DIAGNOSIS — Z510 Encounter for antineoplastic radiation therapy: Secondary | ICD-10-CM | POA: Diagnosis not present

## 2018-04-27 DIAGNOSIS — C50412 Malignant neoplasm of upper-outer quadrant of left female breast: Secondary | ICD-10-CM | POA: Diagnosis not present

## 2018-04-27 DIAGNOSIS — Z510 Encounter for antineoplastic radiation therapy: Secondary | ICD-10-CM | POA: Diagnosis not present

## 2018-04-28 DIAGNOSIS — Z510 Encounter for antineoplastic radiation therapy: Secondary | ICD-10-CM | POA: Diagnosis not present

## 2018-04-28 DIAGNOSIS — C50412 Malignant neoplasm of upper-outer quadrant of left female breast: Secondary | ICD-10-CM | POA: Diagnosis not present

## 2018-04-29 DIAGNOSIS — Z510 Encounter for antineoplastic radiation therapy: Secondary | ICD-10-CM | POA: Diagnosis not present

## 2018-04-29 DIAGNOSIS — C50412 Malignant neoplasm of upper-outer quadrant of left female breast: Secondary | ICD-10-CM | POA: Diagnosis not present

## 2018-04-30 DIAGNOSIS — D72829 Elevated white blood cell count, unspecified: Secondary | ICD-10-CM | POA: Diagnosis not present

## 2018-04-30 DIAGNOSIS — Z1379 Encounter for other screening for genetic and chromosomal anomalies: Secondary | ICD-10-CM | POA: Diagnosis not present

## 2018-04-30 DIAGNOSIS — C50412 Malignant neoplasm of upper-outer quadrant of left female breast: Secondary | ICD-10-CM | POA: Diagnosis not present

## 2018-04-30 DIAGNOSIS — Z510 Encounter for antineoplastic radiation therapy: Secondary | ICD-10-CM | POA: Diagnosis not present

## 2018-04-30 DIAGNOSIS — R05 Cough: Secondary | ICD-10-CM | POA: Diagnosis not present

## 2018-04-30 DIAGNOSIS — G620 Drug-induced polyneuropathy: Secondary | ICD-10-CM | POA: Diagnosis not present

## 2018-04-30 DIAGNOSIS — Z171 Estrogen receptor negative status [ER-]: Secondary | ICD-10-CM | POA: Diagnosis not present

## 2018-04-30 DIAGNOSIS — H9390 Unspecified disorder of ear, unspecified ear: Secondary | ICD-10-CM | POA: Diagnosis not present

## 2018-04-30 DIAGNOSIS — T451X5S Adverse effect of antineoplastic and immunosuppressive drugs, sequela: Secondary | ICD-10-CM | POA: Diagnosis not present

## 2018-04-30 DIAGNOSIS — Z9221 Personal history of antineoplastic chemotherapy: Secondary | ICD-10-CM | POA: Diagnosis not present

## 2018-04-30 DIAGNOSIS — L989 Disorder of the skin and subcutaneous tissue, unspecified: Secondary | ICD-10-CM | POA: Diagnosis not present

## 2018-04-30 DIAGNOSIS — D729 Disorder of white blood cells, unspecified: Secondary | ICD-10-CM | POA: Diagnosis not present

## 2018-04-30 DIAGNOSIS — J029 Acute pharyngitis, unspecified: Secondary | ICD-10-CM | POA: Diagnosis not present

## 2018-05-03 DIAGNOSIS — C50412 Malignant neoplasm of upper-outer quadrant of left female breast: Secondary | ICD-10-CM | POA: Diagnosis not present

## 2018-05-03 DIAGNOSIS — Z510 Encounter for antineoplastic radiation therapy: Secondary | ICD-10-CM | POA: Diagnosis not present

## 2018-05-04 DIAGNOSIS — Z510 Encounter for antineoplastic radiation therapy: Secondary | ICD-10-CM | POA: Diagnosis not present

## 2018-05-04 DIAGNOSIS — C50412 Malignant neoplasm of upper-outer quadrant of left female breast: Secondary | ICD-10-CM | POA: Diagnosis not present

## 2018-05-05 DIAGNOSIS — C50412 Malignant neoplasm of upper-outer quadrant of left female breast: Secondary | ICD-10-CM | POA: Diagnosis not present

## 2018-05-05 DIAGNOSIS — Z510 Encounter for antineoplastic radiation therapy: Secondary | ICD-10-CM | POA: Diagnosis not present

## 2018-05-05 DIAGNOSIS — R58 Hemorrhage, not elsewhere classified: Secondary | ICD-10-CM | POA: Diagnosis not present

## 2018-05-06 DIAGNOSIS — Z510 Encounter for antineoplastic radiation therapy: Secondary | ICD-10-CM | POA: Diagnosis not present

## 2018-05-06 DIAGNOSIS — C50412 Malignant neoplasm of upper-outer quadrant of left female breast: Secondary | ICD-10-CM | POA: Diagnosis not present

## 2018-05-07 DIAGNOSIS — Z510 Encounter for antineoplastic radiation therapy: Secondary | ICD-10-CM | POA: Diagnosis not present

## 2018-05-07 DIAGNOSIS — C50412 Malignant neoplasm of upper-outer quadrant of left female breast: Secondary | ICD-10-CM | POA: Diagnosis not present

## 2018-05-10 DIAGNOSIS — Z510 Encounter for antineoplastic radiation therapy: Secondary | ICD-10-CM | POA: Diagnosis not present

## 2018-05-10 DIAGNOSIS — C50412 Malignant neoplasm of upper-outer quadrant of left female breast: Secondary | ICD-10-CM | POA: Diagnosis not present

## 2018-05-11 DIAGNOSIS — C50412 Malignant neoplasm of upper-outer quadrant of left female breast: Secondary | ICD-10-CM | POA: Diagnosis not present

## 2018-05-11 DIAGNOSIS — Z510 Encounter for antineoplastic radiation therapy: Secondary | ICD-10-CM | POA: Diagnosis not present

## 2018-05-12 DIAGNOSIS — C50412 Malignant neoplasm of upper-outer quadrant of left female breast: Secondary | ICD-10-CM | POA: Diagnosis not present

## 2018-05-12 DIAGNOSIS — Z510 Encounter for antineoplastic radiation therapy: Secondary | ICD-10-CM | POA: Diagnosis not present

## 2018-05-13 DIAGNOSIS — Z510 Encounter for antineoplastic radiation therapy: Secondary | ICD-10-CM | POA: Diagnosis not present

## 2018-05-13 DIAGNOSIS — C50412 Malignant neoplasm of upper-outer quadrant of left female breast: Secondary | ICD-10-CM | POA: Diagnosis not present

## 2018-05-14 DIAGNOSIS — Z510 Encounter for antineoplastic radiation therapy: Secondary | ICD-10-CM | POA: Diagnosis not present

## 2018-05-14 DIAGNOSIS — C50412 Malignant neoplasm of upper-outer quadrant of left female breast: Secondary | ICD-10-CM | POA: Diagnosis not present

## 2018-05-17 DIAGNOSIS — C50412 Malignant neoplasm of upper-outer quadrant of left female breast: Secondary | ICD-10-CM | POA: Diagnosis not present

## 2018-05-17 DIAGNOSIS — Z510 Encounter for antineoplastic radiation therapy: Secondary | ICD-10-CM | POA: Diagnosis not present

## 2018-05-18 DIAGNOSIS — C50412 Malignant neoplasm of upper-outer quadrant of left female breast: Secondary | ICD-10-CM | POA: Diagnosis not present

## 2018-05-18 DIAGNOSIS — Z510 Encounter for antineoplastic radiation therapy: Secondary | ICD-10-CM | POA: Diagnosis not present

## 2018-05-19 DIAGNOSIS — Z510 Encounter for antineoplastic radiation therapy: Secondary | ICD-10-CM | POA: Diagnosis not present

## 2018-05-19 DIAGNOSIS — C50412 Malignant neoplasm of upper-outer quadrant of left female breast: Secondary | ICD-10-CM | POA: Diagnosis not present

## 2018-05-20 DIAGNOSIS — C50412 Malignant neoplasm of upper-outer quadrant of left female breast: Secondary | ICD-10-CM | POA: Diagnosis not present

## 2018-05-20 DIAGNOSIS — Z510 Encounter for antineoplastic radiation therapy: Secondary | ICD-10-CM | POA: Diagnosis not present

## 2018-05-21 DIAGNOSIS — C50412 Malignant neoplasm of upper-outer quadrant of left female breast: Secondary | ICD-10-CM | POA: Diagnosis not present

## 2018-05-21 DIAGNOSIS — Z510 Encounter for antineoplastic radiation therapy: Secondary | ICD-10-CM | POA: Diagnosis not present

## 2018-05-24 DIAGNOSIS — C50412 Malignant neoplasm of upper-outer quadrant of left female breast: Secondary | ICD-10-CM | POA: Diagnosis not present

## 2018-05-24 DIAGNOSIS — Z510 Encounter for antineoplastic radiation therapy: Secondary | ICD-10-CM | POA: Diagnosis not present

## 2018-06-09 DIAGNOSIS — R350 Frequency of micturition: Secondary | ICD-10-CM | POA: Diagnosis not present

## 2018-06-09 DIAGNOSIS — N390 Urinary tract infection, site not specified: Secondary | ICD-10-CM | POA: Diagnosis not present

## 2018-06-09 DIAGNOSIS — R319 Hematuria, unspecified: Secondary | ICD-10-CM | POA: Diagnosis not present

## 2018-08-03 DIAGNOSIS — L03313 Cellulitis of chest wall: Secondary | ICD-10-CM | POA: Diagnosis not present

## 2018-08-05 DIAGNOSIS — G620 Drug-induced polyneuropathy: Secondary | ICD-10-CM | POA: Diagnosis not present

## 2018-08-05 DIAGNOSIS — Z1379 Encounter for other screening for genetic and chromosomal anomalies: Secondary | ICD-10-CM | POA: Diagnosis not present

## 2018-08-05 DIAGNOSIS — Z5181 Encounter for therapeutic drug level monitoring: Secondary | ICD-10-CM | POA: Diagnosis not present

## 2018-08-05 DIAGNOSIS — Z171 Estrogen receptor negative status [ER-]: Secondary | ICD-10-CM | POA: Diagnosis not present

## 2018-08-05 DIAGNOSIS — D729 Disorder of white blood cells, unspecified: Secondary | ICD-10-CM | POA: Diagnosis not present

## 2018-08-05 DIAGNOSIS — R918 Other nonspecific abnormal finding of lung field: Secondary | ICD-10-CM | POA: Diagnosis not present

## 2018-08-05 DIAGNOSIS — D72820 Lymphocytosis (symptomatic): Secondary | ICD-10-CM | POA: Diagnosis not present

## 2018-08-05 DIAGNOSIS — D72821 Monocytosis (symptomatic): Secondary | ICD-10-CM | POA: Diagnosis not present

## 2018-08-05 DIAGNOSIS — L989 Disorder of the skin and subcutaneous tissue, unspecified: Secondary | ICD-10-CM | POA: Diagnosis not present

## 2018-08-05 DIAGNOSIS — C50412 Malignant neoplasm of upper-outer quadrant of left female breast: Secondary | ICD-10-CM | POA: Diagnosis not present

## 2018-08-05 DIAGNOSIS — D649 Anemia, unspecified: Secondary | ICD-10-CM | POA: Diagnosis not present

## 2018-08-05 DIAGNOSIS — T451X5A Adverse effect of antineoplastic and immunosuppressive drugs, initial encounter: Secondary | ICD-10-CM | POA: Diagnosis not present

## 2018-08-05 DIAGNOSIS — Z5111 Encounter for antineoplastic chemotherapy: Secondary | ICD-10-CM | POA: Diagnosis not present

## 2018-08-10 DIAGNOSIS — F329 Major depressive disorder, single episode, unspecified: Secondary | ICD-10-CM | POA: Diagnosis not present

## 2018-08-10 DIAGNOSIS — Z9889 Other specified postprocedural states: Secondary | ICD-10-CM | POA: Diagnosis not present

## 2018-09-08 DIAGNOSIS — R928 Other abnormal and inconclusive findings on diagnostic imaging of breast: Secondary | ICD-10-CM | POA: Diagnosis not present

## 2018-09-08 DIAGNOSIS — C50912 Malignant neoplasm of unspecified site of left female breast: Secondary | ICD-10-CM | POA: Diagnosis not present

## 2018-09-08 DIAGNOSIS — Z853 Personal history of malignant neoplasm of breast: Secondary | ICD-10-CM | POA: Diagnosis not present

## 2018-09-11 ENCOUNTER — Other Ambulatory Visit: Payer: Self-pay | Admitting: Gastroenterology

## 2020-04-23 ENCOUNTER — Other Ambulatory Visit: Payer: Self-pay | Admitting: Orthopedic Surgery

## 2020-04-23 DIAGNOSIS — G8929 Other chronic pain: Secondary | ICD-10-CM

## 2020-05-08 ENCOUNTER — Ambulatory Visit
Admission: RE | Admit: 2020-05-08 | Discharge: 2020-05-08 | Disposition: A | Discharge status: Home / Self Care | Payer: Medicare Other | Source: Ambulatory Visit | Attending: Orthopedic Surgery | Admitting: Orthopedic Surgery

## 2020-05-08 ENCOUNTER — Ambulatory Visit
Admission: RE | Admit: 2020-05-08 | Discharge: 2020-05-08 | Disposition: A | Discharge status: Home / Self Care | Payer: Medicaid Other | Source: Ambulatory Visit | Attending: Orthopedic Surgery | Admitting: Orthopedic Surgery

## 2020-05-08 ENCOUNTER — Other Ambulatory Visit: Payer: Self-pay

## 2020-05-08 DIAGNOSIS — M545 Low back pain, unspecified: Secondary | ICD-10-CM

## 2020-05-08 DIAGNOSIS — G8929 Other chronic pain: Secondary | ICD-10-CM

## 2020-07-16 ENCOUNTER — Other Ambulatory Visit: Payer: Self-pay | Admitting: Orthopedic Surgery

## 2020-07-16 DIAGNOSIS — M5136 Other intervertebral disc degeneration, lumbar region: Secondary | ICD-10-CM

## 2020-07-23 ENCOUNTER — Other Ambulatory Visit: Payer: Medicaid Other

## 2021-02-21 ENCOUNTER — Ambulatory Visit: Payer: Medicare Other | Attending: Orthopedic Surgery | Admitting: Physical Therapy

## 2021-02-21 ENCOUNTER — Other Ambulatory Visit: Payer: Self-pay

## 2021-02-21 DIAGNOSIS — M1990 Unspecified osteoarthritis, unspecified site: Secondary | ICD-10-CM | POA: Diagnosis not present

## 2021-02-21 DIAGNOSIS — Z7409 Other reduced mobility: Secondary | ICD-10-CM | POA: Insufficient documentation

## 2021-02-21 DIAGNOSIS — M545 Low back pain, unspecified: Secondary | ICD-10-CM | POA: Insufficient documentation

## 2021-02-21 DIAGNOSIS — G8929 Other chronic pain: Secondary | ICD-10-CM | POA: Insufficient documentation

## 2021-02-21 NOTE — Therapy (Signed)
[facility] [place], [place], [address] Phone: [phone]   Fax:  [phone]  Physical Therapy Evaluation  Patient Details  Name: Margaret Barker MRN: [id number] Date of Birth: 25-Aug-1971 Referring Provider (PT): Melina Schools MD   Encounter Date: 02/21/2021   PT End of Session - 02/21/21 1435    Visit Number 1    Number of Visits 12    Date for PT Re-Evaluation 03/21/21    PT Start Time 0100    PT Stop Time 0143    PT Time Calculation (min) 43 min    Activity Tolerance Patient tolerated treatment well    Behavior During Therapy [facility] for tasks assessed/performed           Past Medical History:  Diagnosis Date  . Alcoholism ([place])   . Anxiety   . Arthritis    stenosis & scoliosis of lumbar area    . Bipolar disorder ([place])   . Depression   . Elevated liver enzymes 2015   pt. reports as of last check, liver enymes much better   . Erosion of vaginal wall due to surgical mesh ([place])   . Esophagitis   . Frequency of urination   . GERD (gastroesophageal reflux disease)   . Helicobacter pylori gastritis    DX  04-13-2014-- TREATED WITH ANTBIOTICS  . History of panic attacks   . Hyperlipidemia   . Nocturia   . Panic attacks   . SUI (stress urinary incontinence, female)   . Wears glasses     Past Surgical History:  Procedure Laterality Date  . ABDOMINAL HYSTERECTOMY    . CARPAL TUNNEL RELEASE Right   . CHOLECYSTECTOMY  1992  . COLONOSCOPY N/A 10/23/2014   Procedure: COLONOSCOPY;  Surgeon: Inda Castle, MD;  Location: [facility];  Service: Endoscopy;  Laterality: N/A;  . CYSTOSCOPY N/A 04/24/2014   Procedure: CYSTOSCOPY;  Surgeon: Ailene Rud, MD;  Location: [facility];  Service: Urology;  Laterality: N/A;  . D & C HYSTEROSCOPY W/ POLYP REMOVAL  04-22-2006  . ESOPHAGOGASTRODUODENOSCOPY (EGD) WITH PROPOFOL N/A 10/23/2014   Procedure: ESOPHAGOGASTRODUODENOSCOPY (EGD) WITH PROPOFOL;   Surgeon: Inda Castle, MD;  Location: [facility];  Service: Endoscopy;  Laterality: N/A;  . LAPAROSCOPIC ASSISTED VAGINAL HYSTERECTOMY  10-22-2006  . LEFT BREAST EXCISIONAL BX  03-26-2007   BENIGN  . LESION REMOVAL  07/09/2012   Procedure: EXCISION VAGINAL LESION;  Surgeon: Ailene Rud, MD;  Location: [facility];  Service: Urology;  Laterality: N/A;  Excision of Vaginal apical cyst  . PUBOVAGINAL SLING N/A 04/24/2014   Procedure: EXCISE OF VAGINAL APICAL MESH EXTRUSION;  Surgeon: Ailene Rud, MD;  Location: [facility];  Service: Urology;  Laterality: N/A;  . RIGHT EXCISIONAL BREAST BX  10-23-2005   BENIGN  . SACROILIAC JOINT FUSION Right 12/20/2015   Procedure: RIGHT SACROILIAC  FUSION;  Surgeon: Melina Schools, MD;  Location: [place] [facility];  Service: Orthopedics;  Laterality: Right;  . SACROSPINUS ANTERIOR CULPOSUSPENSION WITH UPHOLD MESH AND SOLYX SINGLE INCISION TRANSURETHRAL SLING  08-09-2009 DR TANNENBAUM   STRESS INCONTINENCE W/ PELVIC FLOOR PROLAPSE  . TUBAL LIGATION      There were no vitals filed for this visit.    Subjective Assessment - 02/21/21 1507    Subjective COVID-19 screen performed prior to patient entering clinic.  The patient presents to the clinic with c/o low back pain, left > right since Septmber of 2021.  Her pain  is rated at an 8/10 today and she has found nothing helps decrease her pain.  Housework increases her pain.    Pertinent History Bipolar, Right CTR, Right SIJ fusion (12/30/15).  OA.    How long can you sit comfortably? Varies.    How long can you stand comfortably? Varies.    How long can you walk comfortably? Varies.    Patient Stated Goals Reduce pain when performing ADL's.    Currently in Pain? Yes    Pain Score 8     Pain Location Back    Pain Orientation Left;Posterior;Mid;Right    Pain Descriptors / Indicators Aching;Sore;Sharp;Shooting    Pain Type Chronic pain    Pain Onset More than a month ago     Pain Frequency Constant    Aggravating Factors  See above.    Pain Relieving Factors See above.              [facility] PT Assessment - 02/21/21 0001      Assessment   Medical Diagnosis Low back pain.    Referring Provider (PT) Melina Schools MD      Precautions   Precautions None      Restrictions   Weight Bearing Restrictions No      Balance Screen   Has the patient fallen in the past 6 months No    Has the patient had a decrease in activity level because of a fear of falling?  No    Is the patient reluctant to leave their home because of a fear of falling?  No      Home Ecologist residence      Prior Function   Level of Independence Independent      Posture/Postural Control   Posture/Postural Control No significant limitations      Deep Tendon Reflexes   DTR Assessment Site Patella;Achilles    Patella DTR 2+    Achilles DTR 2+      ROM / Strength   AROM / PROM / Strength AROM;Strength      AROM   Overall AROM Comments Active lumbar extension to 15 degrees and flexion limited by 50%.      Strength   Overall Strength Comments Normal LE strength.      Palpation   Palpation comment Very tender to palpation over left upper gluteal musculature and pain reported across lower lumbar region to right side.      Special Tests   Other special tests Equal leg length. (-) SLR test.      Ambulation/Gait   Gait Comments WNL.                      Objective measurements completed on examination: See above findings.       [facility] Adult PT Treatment/Exercise - 02/21/21 0001      Modalities   Modalities Electrical Stimulation;Moist Heat      Moist Heat Therapy   Number Minutes Moist Heat 15 Minutes    Moist Heat Location Lumbar Spine      Electrical Stimulation   Electrical Stimulation Location Left low back/upper glut    Electrical Stimulation Action IFC                       PT Long Term Goals - 02/21/21  1525      PT LONG TERM GOAL #1   Title Independent with a HEP.    Time 4  Period Weeks    Status New      PT LONG TERM GOAL #2   Title Sit 30 minutes with pain not > 3-4/10.    Time 4    Period Weeks    Status New      PT LONG TERM GOAL #3   Title Perform ADL's with pain  not > 4/10.    Time 4    Period Weeks    Status New                  Plan - 02/21/21 1516    Clinical Impression Statement The patient presents to [facility] with c/o low back pain left > right.  She is very tender to palpation over her left upper gluteal musculature and reports pain across her low back to right side.  Her functional mobility is impaired due to pain and ADL performance causes her a greta deal of pain.  LE DTR's are intact, LE strength is nomal and she demonstrates a (-) left SLR test.  Patient will benefit from skilled physical therapy intervention to address pain and deficits.    Personal Factors and Comorbidities Comorbidity 1;Other;Comorbidity 2    Comorbidities Bipolar, Right CTR, Right SIJ fusion (12/30/15).  OA.    Examination-Activity Limitations Other;Stand;Sit    Examination-Participation Restrictions Other    Stability/Clinical Decision Making Evolving/Moderate complexity    Clinical Decision Making Low    Rehab Potential Excellent    PT Frequency 3x / week    PT Duration 4 weeks    PT Treatment/Interventions ADLs/Self Care Home Management;Cryotherapy;Electrical Stimulation;Moist Heat;Ultrasound;Therapeutic exercise;Therapeutic activities;Patient/family education;Manual techniques;Dry needling;Passive range of motion    PT Next Visit Plan Combo e'stim/US to left upper gluteal region, STW/M, Dry needling.  Core exercise progression.    Consulted and Agree with Plan of Care Patient           Patient will benefit from skilled therapeutic intervention in order to improve the following deficits and impairments:  Pain,Decreased activity tolerance,Increased muscle spasms  Visit  Diagnosis: Chronic bilateral low back pain without sciatica - Plan: PT plan of care cert/re-cert     Problem List Patient Active Problem List   Diagnosis Date Noted  . Depression 03/19/2016  . Hyperlipemia 03/19/2016  . Complicated grieving 49/70/2637  . SI (sacroiliac) pain 12/20/2015  . GAD (generalized anxiety disorder) 07/20/2015  . Insomnia 07/20/2015  . Prolapse of vaginal wall with midline cystocele 04/24/2014  . MIXED INCONTINENCE URGE AND STRESS 03/21/2009  . PANIC ATTACKS 12/10/2006  . POST TRAUMATIC STRESS DISORDER 12/10/2006  . GASTROESOPHAGEAL REFLUX, NO ESOPHAGITIS 12/10/2006    Bertie Simien, [place] MPT 02/21/2021, 3:28 PM  [facility] [place], [place], [address] Phone: [phone]   Fax:  [phone]  Name: Margaret Barker MRN: [id number] Date of Birth: [birth year]

## 2021-02-26 ENCOUNTER — Other Ambulatory Visit: Payer: Self-pay

## 2021-02-26 ENCOUNTER — Ambulatory Visit: Payer: Medicare Other | Admitting: Physical Therapy

## 2021-02-26 DIAGNOSIS — M545 Low back pain, unspecified: Secondary | ICD-10-CM

## 2021-02-26 NOTE — Therapy (Signed)
[facility] [employer], [place], [address] Phone: [phone]   Fax:  [phone]  Physical Therapy Treatment  Patient Details  Name: Margaret Barker MRN: [id number] Date of Birth: 06/15/1971 Referring Provider (PT): Melina Schools MD   Encounter Date: 02/26/2021   PT End of Session - 02/26/21 1208    Visit Number 2    Number of Visits 12    Date for PT Re-Evaluation 03/21/21    PT Start Time 1200    PT Stop Time 1246    PT Time Calculation (min) 46 min    Activity Tolerance Patient tolerated treatment well    Behavior During Therapy [facility] for tasks assessed/performed           Past Medical History:  Diagnosis Date  . Alcoholism ([place])   . Anxiety   . Arthritis    stenosis & scoliosis of lumbar area    . Bipolar disorder ([place])   . Depression   . Elevated liver enzymes 2015   pt. reports as of last check, liver enymes much better   . Erosion of vaginal wall due to surgical mesh ([place])   . Esophagitis   . Frequency of urination   . GERD (gastroesophageal reflux disease)   . Helicobacter pylori gastritis    DX  04-13-2014-- TREATED WITH ANTBIOTICS  . History of panic attacks   . Hyperlipidemia   . Nocturia   . Panic attacks   . SUI (stress urinary incontinence, female)   . Wears glasses     Past Surgical History:  Procedure Laterality Date  . ABDOMINAL HYSTERECTOMY    . CARPAL TUNNEL RELEASE Right   . CHOLECYSTECTOMY  1992  . COLONOSCOPY N/A 10/23/2014   Procedure: COLONOSCOPY;  Surgeon: Inda Castle, MD;  [facility];  Service: Endoscopy;  Laterality: N/A;  . CYSTOSCOPY N/A 04/24/2014   Procedure: CYSTOSCOPY;  Surgeon: Ailene Rud, MD;  Location: [facility];  Service: Urology;  Laterality: N/A;  . D & C HYSTEROSCOPY W/ POLYP REMOVAL  04-22-2006  . ESOPHAGOGASTRODUODENOSCOPY (EGD) WITH PROPOFOL N/A 10/23/2014   Procedure: ESOPHAGOGASTRODUODENOSCOPY (EGD) WITH PROPOFOL;   Surgeon: Inda Castle, MD;  [facility];  Service: Endoscopy;  Laterality: N/A;  . LAPAROSCOPIC ASSISTED VAGINAL HYSTERECTOMY  10-22-2006  . LEFT BREAST EXCISIONAL BX  03-26-2007   BENIGN  . LESION REMOVAL  07/09/2012   Procedure: EXCISION VAGINAL LESION;  Surgeon: Ailene Rud, MD;  Location[facility];  Service: Urology;  Laterality: N/A;  Excision of Vaginal apical cyst  . PUBOVAGINAL SLING N/A 04/24/2014   Procedure: EXCISE OF VAGINAL APICAL MESH EXTRUSION;  Surgeon: Ailene Rud, MD;  Location: [facility];  Service: Urology;  Laterality: N/A;  . RIGHT EXCISIONAL BREAST BX  10-23-2005   BENIGN  . SACROILIAC JOINT FUSION Right 12/20/2015   Procedure: RIGHT SACROILIAC  FUSION;  Surgeon: Melina Schools, MD;  Location: [place];  Service: Orthopedics;  Laterality: Right;  . SACROSPINUS ANTERIOR CULPOSUSPENSION WITH UPHOLD MESH AND SOLYX SINGLE INCISION TRANSURETHRAL SLING  08-09-2009 DR TANNENBAUM   STRESS INCONTINENCE W/ PELVIC FLOOR PROLAPSE  . TUBAL LIGATION      There were no vitals filed for this visit.   Subjective Assessment - 02/26/21 1155    Subjective COVID-19 screen performed prior to patient entering clinic. Patient reported some soreness afteer exercise yesterday at home    Pertinent History Bipolar, Right CTR, Right SIJ fusion (12/30/15).  OA.  How long can you sit comfortably? Varies.    How long can you stand comfortably? Varies.    How long can you walk comfortably? Varies.    Patient Stated Goals Reduce pain when performing ADL's.    Currently in Pain? Yes    Pain Score 6     Pain Location Back    Pain Orientation Left;Posterior;Mid;Right    Pain Descriptors / Indicators Discomfort;Sore    Pain Type Chronic pain    Pain Onset More than a month ago    Pain Frequency Constant    Aggravating Factors  everything    Pain Relieving Factors nothing                             [facility] Adult PT  Treatment/Exercise - 02/26/21 0001      Self-Care   Self-Care ADL's;Lifting;Posture;Other Self-Care Comments    Other Self-Care Comments  HEP provided      Exercises   Exercises Lumbar      Lumbar Exercises: Aerobic   Nustep L3 x24min UE/LE activity, monitored      Lumbar Exercises: Supine   Ab Set 20 reps;3 seconds    Glut Set 3 seconds;20 reps    Clam 20 reps   with red band   Bent Knee Raise 3 seconds   2x10   Bridge 20 reps;2 seconds    Straight Leg Raise 3 seconds   2x10     Moist Heat Therapy   Number Minutes Moist Heat 15 Minutes    Moist Heat Location Lumbar Spine      Electrical Stimulation   Electrical Stimulation Location Left low back/upper glut    Electrical Stimulation Action IFC    Electrical Stimulation Parameters 80-150hz  x29min    Electrical Stimulation Goals Pain                  PT Education - 02/26/21 1225    Education Details HEP progression    Person(s) Educated Patient    Methods Explanation;Demonstration;Handout    Comprehension Verbalized understanding;Returned demonstration               PT Long Term Goals - 02/26/21 1210      PT LONG TERM GOAL #1   Title Independent with a HEP.    Time 4    Period Weeks    Status On-going      PT LONG TERM GOAL #2   Title Sit 30 minutes with pain not > 3-4/10.    Time 4    Period Weeks    Status On-going      PT LONG TERM GOAL #3   Title Perform ADL's with pain  not > 4/10.    Time 4    Period Weeks    Status On-going                 Plan - 02/26/21 1231    Clinical Impression Statement Patient tolerated treatment well today. Patient able to progress with abdominal bracing and gentle progression today. Educated patient on posture awareness techniques with HEP provided. Patient has reported that she has constant pain and nothing helps at this time. Good response today. goals progressing.    Personal Factors and Comorbidities Comorbidity 1;Other;Comorbidity 2     Comorbidities Bipolar, Right CTR, Right SIJ fusion (12/30/15).  OA.    Examination-Activity Limitations Other;Stand;Sit    Examination-Participation Restrictions Other    Stability/Clinical Decision Making Evolving/Moderate complexity  Rehab Potential Excellent    PT Frequency 3x / week    PT Duration 4 weeks    PT Treatment/Interventions ADLs/Self Care Home Management;Cryotherapy;Electrical Stimulation;Moist Heat;Ultrasound;Therapeutic exercise;Therapeutic activities;Patient/family education;Manual techniques;Dry needling;Passive range of motion    PT Next Visit Plan Combo e'stim/US to left upper gluteal region, STW/M, Dry needling.  Core exercise progression.    Consulted and Agree with Plan of Care Patient           Patient will benefit from skilled therapeutic intervention in order to improve the following deficits and impairments:  Pain,Decreased activity tolerance,Increased muscle spasms  Visit Diagnosis: Chronic bilateral low back pain without sciatica     Problem List Patient Active Problem List   Diagnosis Date Noted  . Depression 03/19/2016  . Hyperlipemia 03/19/2016  . Complicated grieving 16/07/9603  . SI (sacroiliac) pain 12/20/2015  . GAD (generalized anxiety disorder) 07/20/2015  . Insomnia 07/20/2015  . Prolapse of vaginal wall with midline cystocele 04/24/2014  . MIXED INCONTINENCE URGE AND STRESS 03/21/2009  . PANIC ATTACKS 12/10/2006  . POST TRAUMATIC STRESS DISORDER 12/10/2006  . GASTROESOPHAGEAL REFLUX, NO ESOPHAGITIS 12/10/2006    Lamondre Wesche P, PTA 02/26/2021, 12:49 PM  [facility] [place], [place], [address] Phone: [phone]   Fax:  [phone]  Name: Margaret Barker MRN: [id number] Date of Birth: Feb 28, 1971

## 2021-02-26 NOTE — Patient Instructions (Signed)
Pelvic Tilt: Posterior - Legs Bent (Supine)  Tighten stomach and flatten back by rolling pelvis down. Hold _10___ seconds. Relax. Repeat _10-30___ times per set. Do __2__ sets per session. Do _2___ sessions per day.   Bent Leg Lift (Hook-Lying)  Tighten stomach and slowly raise right leg _5___ inches from floor. Keep trunk rigid. Hold _3___ seconds. Repeat _10___ times per set. Do ___2-3_ sets per session. Do __2__ sessions per day.  Brushing Teeth    Place one foot on ledge and one hand on counter. Bend other knee slightly to keep back straight.  Copyright  VHI. All rights reserved.  Refrigerator   Squat with knees apart to reach lower shelves and drawers.   Copyright  VHI. All rights reserved.  Laundry Basket   Squat down and hold basket close to stand. Use leg muscles to do the work.   Copyright  VHI. All rights reserved.  Housework - Vacuuming   Hold the vacuum with arm held at side. Step back and forth to move it, keeping head up. Avoid twisting.   Copyright  VHI. All rights reserved.  Housework - Wiping   Position yourself as close as possible to reach work surface. Avoid straining your back.   Copyright  VHI. All rights reserved.  Gardening - Mowing   Keep arms close to sides and walk with lawn mower.   Copyright  VHI. All rights reserved.  Sleeping on Side   Place pillow between knees. Use cervical support under neck and a roll around waist as needed.   Copyright  VHI. All rights reserved.  Log Roll   Lying on back, bend left knee and place left arm across chest. Roll all in one movement to the right. Reverse to roll to the left. Always move as one unit.   Copyright  VHI. All rights reserved.  Stand to Sit / Sit to Stand   To sit: Bend knees to lower self onto front edge of chair, then scoot back on seat. To stand: Reverse sequence by placing one foot forward, and scoot to front of seat. Use rocking motion to stand  up.  Copyright  VHI. All rights reserved.  Posture - Standing   Good posture is important. Avoid slouching and forward head thrust. Maintain curve in low back and align ears over shoul- ders, hips over ankles.   Copyright  VHI. All rights reserved.  Posture - Sitting   Sit upright, head facing forward. Try using a roll to support lower back. Keep shoulders relaxed, and avoid rounded back. Keep hips level with knees. Avoid crossing legs for long periods.   Copyright  VHI. All rights reserved.  Computer Work   Position work to face forward. Use proper work and seat height. Keep shoulders back and down, wrists straight, and elbows at right angles. Use chair that provides full back support. Add footrest and lumbar roll as needed.   Copyright  VHI. All rights reserved.                

## 2021-03-01 ENCOUNTER — Other Ambulatory Visit: Payer: Self-pay

## 2021-03-01 ENCOUNTER — Ambulatory Visit: Payer: Medicare Other | Admitting: *Deleted

## 2021-03-01 DIAGNOSIS — M545 Low back pain, unspecified: Secondary | ICD-10-CM | POA: Diagnosis not present

## 2021-03-01 DIAGNOSIS — G8929 Other chronic pain: Secondary | ICD-10-CM

## 2021-03-01 NOTE — Therapy (Signed)
[facility] [place], [place], [address] Phone: [phone]   Fax:  [phone]  Physical Therapy Treatment  Patient Details  Name: Margaret Barker MRN: [id number] Date of Birth: May 02, 1971 Referring Provider (PT): Melina Schools MD   Encounter Date: 03/01/2021   PT End of Session - 03/01/21 1212    Visit Number 3    Number of Visits 12    Date for PT Re-Evaluation 03/21/21    PT Start Time 1115    PT Stop Time 1213    PT Time Calculation (min) 58 min           Past Medical History:  Diagnosis Date  . Alcoholism ([place])   . Anxiety   . Arthritis    stenosis & scoliosis of lumbar area    . Bipolar disorder ([place])   . Depression   . Elevated liver enzymes 2015   pt. reports as of last check, liver enymes much better   . Erosion of vaginal wall due to surgical mesh ([place])   . Esophagitis   . Frequency of urination   . GERD (gastroesophageal reflux disease)   . Helicobacter pylori gastritis    DX  04-13-2014-- TREATED WITH ANTBIOTICS  . History of panic attacks   . Hyperlipidemia   . Nocturia   . Panic attacks   . SUI (stress urinary incontinence, female)   . Wears glasses     Past Surgical History:  Procedure Laterality Date  . ABDOMINAL HYSTERECTOMY    . CARPAL TUNNEL RELEASE Right   . CHOLECYSTECTOMY  1992  . COLONOSCOPY N/A 10/23/2014   Procedure: COLONOSCOPY;  Surgeon: Inda Castle, MD;  [facility];  Service: Endoscopy;  Laterality: N/A;  . CYSTOSCOPY N/A 04/24/2014   Procedure: CYSTOSCOPY;  Surgeon: Ailene Rud, MD;  Location: [facility];  Service: Urology;  Laterality: N/A;  . D & C HYSTEROSCOPY W/ POLYP REMOVAL  04-22-2006  . ESOPHAGOGASTRODUODENOSCOPY (EGD) WITH PROPOFOL N/A 10/23/2014   Procedure: ESOPHAGOGASTRODUODENOSCOPY (EGD) WITH PROPOFOL;  Surgeon: Inda Castle, MD;  [facility];  Service: Endoscopy;  Laterality: N/A;  . LAPAROSCOPIC ASSISTED  VAGINAL HYSTERECTOMY  10-22-2006  . LEFT BREAST EXCISIONAL BX  03-26-2007   BENIGN  . LESION REMOVAL  07/09/2012   Procedure: EXCISION VAGINAL LESION;  Surgeon: Ailene Rud, MD;  Location: [facility];  Service: Urology;  Laterality: N/A;  Excision of Vaginal apical cyst  . PUBOVAGINAL SLING N/A 04/24/2014   Procedure: EXCISE OF VAGINAL APICAL MESH EXTRUSION;  Surgeon: Ailene Rud, MD;  [facility];  Service: Urology;  Laterality: N/A;  . RIGHT EXCISIONAL BREAST BX  10-23-2005   BENIGN  . SACROILIAC JOINT FUSION Right 12/20/2015   Procedure: RIGHT SACROILIAC  FUSION;  Surgeon: Melina Schools, MD;  Location: [place];  Service: Orthopedics;  Laterality: Right;  . SACROSPINUS ANTERIOR CULPOSUSPENSION WITH UPHOLD MESH AND SOLYX SINGLE INCISION TRANSURETHRAL SLING  08-09-2009 DR TANNENBAUM   STRESS INCONTINENCE W/ PELVIC FLOOR PROLAPSE  . TUBAL LIGATION      There were no vitals filed for this visit.   Subjective Assessment - 03/01/21 1125    Subjective COVID-19 screen performed prior to patient entering clinic. 4/10 LB    How long can you sit comfortably? Varies.    How long can you stand comfortably? Varies.    How long can you walk comfortably? Varies.    Patient Stated Goals Reduce pain when performing  ADL's.    Currently in Pain? Yes    Pain Score 4     Pain Location Back    Pain Orientation Left;Right    Pain Descriptors / Indicators Discomfort    Pain Type Chronic pain                             [facility] Adult PT Treatment/Exercise - 03/01/21 0001      Therapeutic Activites    Therapeutic Activities ADL's;Lifting    ADL's IN/ OUT of bed with AB brace and log roll practiced, Don/Doff socks and shoes, counter act.'s,    Lifting Chair squat 2x10 to practice AB brace and hinge bending for lifting      Exercises   Exercises Lumbar      Lumbar Exercises: Aerobic   Nustep L3 x37min UE/LE activity, monitored       Lumbar Exercises: Supine   Ab Set 20 reps;3 seconds   standing position                      PT Long Term Goals - 02/26/21 1210      PT LONG TERM GOAL #1   Title Independent with a HEP.    Time 4    Period Weeks    Status On-going      PT LONG TERM GOAL #2   Title Sit 30 minutes with pain not > 3-4/10.    Time 4    Period Weeks    Status On-going      PT LONG TERM GOAL #3   Title Perform ADL's with pain  not > 4/10.    Time 4    Period Weeks    Status On-going                 Plan - 03/01/21 1213    Clinical Impression Statement Pt arrived today doing fair. with LBP. Rx focused on neutral pelvis with AB bracing and spine friendly movement patterns to decrease her pain triggers with ADL's. Pt had normal modality response today    Personal Factors and Comorbidities Comorbidity 1;Other;Comorbidity 2    Comorbidities Bipolar, Right CTR, Right SIJ fusion (12/30/15).  OA.    Examination-Activity Limitations Other;Stand;Sit    Examination-Participation Restrictions Other    Stability/Clinical Decision Making Evolving/Moderate complexity    Rehab Potential Excellent    PT Frequency 3x / week    PT Duration 4 weeks    PT Treatment/Interventions ADLs/Self Care Home Management;Cryotherapy;Electrical Stimulation;Moist Heat;Ultrasound;Therapeutic exercise;Therapeutic activities;Patient/family education;Manual techniques;Dry needling;Passive range of motion    PT Next Visit Plan Combo e'stim/US to left upper gluteal region, STW/M, Dry needling.  Core exercise progression.           Patient will benefit from skilled therapeutic intervention in order to improve the following deficits and impairments:  Pain,Decreased activity tolerance,Increased muscle spasms  Visit Diagnosis: Chronic bilateral low back pain without sciatica     Problem List Patient Active Problem List   Diagnosis Date Noted  . Depression 03/19/2016  . Hyperlipemia 03/19/2016  .  Complicated grieving 65/78/4696  . SI (sacroiliac) pain 12/20/2015  . GAD (generalized anxiety disorder) 07/20/2015  . Insomnia 07/20/2015  . Prolapse of vaginal wall with midline cystocele 04/24/2014  . MIXED INCONTINENCE URGE AND STRESS 03/21/2009  . PANIC ATTACKS 12/10/2006  . POST TRAUMATIC STRESS DISORDER 12/10/2006  . GASTROESOPHAGEAL REFLUX, NO ESOPHAGITIS 12/10/2006    Benjiman Sedgwick,CHRIS , PTA 03/01/2021,  12:27 PM  [facility] [place], [place], [address] Phone: [phone]   Fax:  [phone]  Name: Margaret Barker MRN: [id number] Date of Birth: 1971/02/01

## 2021-03-06 ENCOUNTER — Other Ambulatory Visit: Payer: Self-pay

## 2021-03-06 ENCOUNTER — Ambulatory Visit: Payer: Medicare Other | Admitting: Physical Therapy

## 2021-03-06 DIAGNOSIS — G8929 Other chronic pain: Secondary | ICD-10-CM

## 2021-03-06 DIAGNOSIS — M545 Low back pain, unspecified: Secondary | ICD-10-CM | POA: Diagnosis not present

## 2021-03-06 NOTE — Therapy (Addendum)
[facility] [place], [place], [address] Phone: [phone]   Fax:  [phone]  Physical Therapy Treatment  Patient Details  Name: Margaret Barker MRN: [id number] Date of Birth: May 19, 1971 Referring Provider (PT): Melina Schools MD   Encounter Date: 03/06/2021   PT End of Session - 03/06/21 1040     Visit Number 4    Number of Visits 12    Date for PT Re-Evaluation 03/21/21    PT Start Time 1030    PT Stop Time 1112    PT Time Calculation (min) 42 min    Activity Tolerance Patient tolerated treatment well    Behavior During Therapy [facility] for tasks assessed/performed             Past Medical History:  Diagnosis Date   Alcoholism ([place])    Anxiety    Arthritis    stenosis & scoliosis of lumbar area     Bipolar disorder ([place])    Depression    Elevated liver enzymes 2015   pt. reports as of last check, liver enymes much better    Erosion of vaginal wall due to surgical mesh ([place])    Esophagitis    Frequency of urination    GERD (gastroesophageal reflux disease)    Helicobacter pylori gastritis    DX  04-13-2014-- TREATED WITH ANTBIOTICS   History of panic attacks    Hyperlipidemia    Nocturia    Panic attacks    SUI (stress urinary incontinence, female)    Wears glasses     Past Surgical History:  Procedure Laterality Date   ABDOMINAL HYSTERECTOMY     CARPAL TUNNEL RELEASE Right    CHOLECYSTECTOMY  1992   COLONOSCOPY N/A 10/23/2014   Procedure: COLONOSCOPY;  Surgeon: Inda Castle, MD;  [facility];  Service: Endoscopy;  Laterality: N/A;   CYSTOSCOPY N/A 04/24/2014   Procedure: CYSTOSCOPY;  Surgeon: Ailene Rud, MD;  Location: [facility];  Service: Urology;  Laterality: N/A;   D & C HYSTEROSCOPY W/ POLYP REMOVAL  04-22-2006   ESOPHAGOGASTRODUODENOSCOPY (EGD) WITH PROPOFOL N/A 10/23/2014   Procedure: ESOPHAGOGASTRODUODENOSCOPY (EGD) WITH PROPOFOL;  Surgeon: Inda Castle, MD;  [facility];  Service: Endoscopy;  Laterality: N/A;   LAPAROSCOPIC ASSISTED VAGINAL HYSTERECTOMY  10-22-2006   LEFT BREAST EXCISIONAL BX  03-26-2007   BENIGN   LESION REMOVAL  07/09/2012   Procedure: EXCISION VAGINAL LESION;  Surgeon: Ailene Rud, MD;  [facility];  Service: Urology;  Laterality: N/A;  Excision of Vaginal apical cyst   PUBOVAGINAL SLING N/A 04/24/2014   Procedure: EXCISE OF VAGINAL APICAL MESH EXTRUSION;  Surgeon: Ailene Rud, MD;  Location: [facility];  Service: Urology;  Laterality: N/A;   RIGHT EXCISIONAL BREAST BX  10-23-2005   BENIGN   SACROILIAC JOINT FUSION Right 12/20/2015   Procedure: RIGHT SACROILIAC  FUSION;  Surgeon: Melina Schools, MD;  Location: [place];  Service: Orthopedics;  Laterality: Right;   [place] WITH UPHOLD MESH AND SOLYX SINGLE INCISION TRANSURETHRAL SLING  08-09-2009 DR Gaynelle Arabian   STRESS INCONTINENCE W/ PELVIC FLOOR PROLAPSE   TUBAL LIGATION      There were no vitals filed for this visit.   Subjective Assessment - 03/06/21 1038     Subjective COVID-19 screen performed prior to patient entering clinic. Patient arrived with some ongoing pain, she reported having a shot in back at [facility].    Pertinent  History Bipolar, Right CTR, Right SIJ fusion (12/30/15).  OA.    How long can you sit comfortably? Varies.    How long can you stand comfortably? Varies.    How long can you walk comfortably? Varies.    Patient Stated Goals Reduce pain when performing ADL's.    Currently in Pain? Yes    Pain Score 5     Pain Location Back    Pain Orientation Right;Left    Pain Descriptors / Indicators Discomfort    Pain Type Chronic pain    Pain Onset More than a month ago    Pain Frequency Constant    Aggravating Factors  everything /movement    Pain Relieving Factors nothing at this time                               [facility] Adult PT  Treatment/Exercise - 03/06/21 0001       Lumbar Exercises: Aerobic   Nustep L3 x54mn UE/LE activity, monitored      Lumbar Exercises: Standing   Row Both;Strengthening;20 reps;Theraband    Theraband Level (Row) Level 2 (Red)    Shoulder Extension Strengthening;Both;20 reps;Theraband    Theraband Level (Shoulder Extension) Level 2 (Red)      Lumbar Exercises: Supine   Clam 20 reps   with red band   Bent Knee Raise 3 seconds   2# to opposite knee 2x10   Bridge 20 reps;2 seconds    Straight Leg Raise 3 seconds   2x10     Moist Heat Therapy   Number Minutes Moist Heat 15 Minutes    Moist Heat Location Lumbar Spine      Electrical Stimulation   Electrical Stimulation Location Left low back/upper glut    Electrical Stimulation Action IFC    Electrical Stimulation Parameters 80-_0  x121m    Electrical Stimulation Goals Pain                         PT Long Term Goals - 03/06/21 1041       PT LONG TERM GOAL #1   Title Independent with a HEP.    Time 4    Period Weeks    Status On-going      PT LONG TERM GOAL #2   Title Sit 30 minutes with pain not > 3-4/10.    Baseline over 5/10 pain 03/06/21    Time 4    Period Weeks    Status On-going      PT LONG TERM GOAL #3   Title Perform ADL's with pain  not > 4/10.    Baseline over 5/10 pain 03/06/21    Time 4    Period Weeks    Status On-going                   Plan - 03/06/21 1052     Clinical Impression Statement Patient tolerated treatment well today. Patient progressing with abdominal bracing in standing today with good technique. Patient continues to have over 5/10 pain with ADL's and prolong sitting. Goals ongoing this week.    Personal Factors and Comorbidities Comorbidity 1;Other;Comorbidity 2    Comorbidities Bipolar, Right CTR, Right SIJ fusion (12/30/15).  OA.    Examination-Activity Limitations Other;Stand;Sit    Examination-Participation Restrictions Other    Stability/Clinical  Decision Making Evolving/Moderate complexity    Rehab Potential Excellent    PT Frequency 3x /  week    PT Duration 4 weeks    PT Treatment/Interventions ADLs/Self Care Home Management;Cryotherapy;Electrical Stimulation;Moist Heat;Ultrasound;Therapeutic exercise;Therapeutic activities;Patient/family education;Manual techniques;Dry needling;Passive range of motion    PT Next Visit Plan Combo e'stim/US to left upper gluteal region, STW/M, Dry needling.  Core exercise progression.    Consulted and Agree with Plan of Care Patient             Patient will benefit from skilled therapeutic intervention in order to improve the following deficits and impairments:  Pain,Decreased activity tolerance,Increased muscle spasms  Visit Diagnosis: Chronic bilateral low back pain without sciatica     Problem List Patient Active Problem List   Diagnosis Date Noted   Depression 03/19/2016   Hyperlipemia 24/38/3654   Complicated grieving 27/15/6648   SI (sacroiliac) pain 12/20/2015   GAD (generalized anxiety disorder) 07/20/2015   Insomnia 07/20/2015   Prolapse of vaginal wall with midline cystocele 04/24/2014   MIXED INCONTINENCE URGE AND STRESS 03/21/2009   PANIC ATTACKS 12/10/2006   POST TRAUMATIC STRESS DISORDER 12/10/2006   GASTROESOPHAGEAL REFLUX, NO ESOPHAGITIS 12/10/2006    Deana Krock P, PTA 03/06/2021, 11:12 AM  [facility] [address] [place], [place], [address] Phone: [phone]   Fax:  [phone]  Name: Margaret Barker MRN: [id number] Date of Birth: [birth year]  PHYSICAL THERAPY DISCHARGE SUMMARY  Visits from Start of Care: 4.  Current functional level related to goals / functional outcomes: See above.   Remaining deficits: See below.   Education / Equipment: HEP.   Patient agrees to discharge. Patient goals were not met. Patient is being discharged due to not returning since the last visit.    Mali Applegate  MPT

## 2021-03-08 ENCOUNTER — Ambulatory Visit: Payer: Medicare Other | Admitting: *Deleted

## 2021-03-20 ENCOUNTER — Other Ambulatory Visit: Payer: Self-pay | Admitting: Physical Medicine and Rehabilitation

## 2021-03-21 ENCOUNTER — Other Ambulatory Visit: Payer: Self-pay | Admitting: Orthopedic Surgery

## 2021-03-21 DIAGNOSIS — G8929 Other chronic pain: Secondary | ICD-10-CM
